# Patient Record
Sex: Female | Born: 1950 | Race: White | Hispanic: No | Marital: Married | Smoking: Never smoker
Health system: Southern US, Community
[De-identification: ages and names within clinical notes are randomized; demographics above are authoritative.]

## PROBLEM LIST (undated history)

## (undated) DIAGNOSIS — B029 Zoster without complications: Secondary | ICD-10-CM

## (undated) DIAGNOSIS — T7840XA Allergy, unspecified, initial encounter: Secondary | ICD-10-CM

## (undated) DIAGNOSIS — J189 Pneumonia, unspecified organism: Secondary | ICD-10-CM

## (undated) DIAGNOSIS — C211 Malignant neoplasm of anal canal: Secondary | ICD-10-CM

## (undated) DIAGNOSIS — D649 Anemia, unspecified: Secondary | ICD-10-CM

## (undated) DIAGNOSIS — J45909 Unspecified asthma, uncomplicated: Secondary | ICD-10-CM

## (undated) DIAGNOSIS — J302 Other seasonal allergic rhinitis: Secondary | ICD-10-CM

## (undated) DIAGNOSIS — M810 Age-related osteoporosis without current pathological fracture: Secondary | ICD-10-CM

## (undated) DIAGNOSIS — E7849 Other hyperlipidemia: Secondary | ICD-10-CM

## (undated) HISTORY — PX: WISDOM TOOTH EXTRACTION: SHX21

## (undated) HISTORY — DX: Other hyperlipidemia: E78.49

## (undated) HISTORY — DX: Zoster without complications: B02.9

## (undated) HISTORY — DX: Allergy, unspecified, initial encounter: T78.40XA

## (undated) HISTORY — DX: Pneumonia, unspecified organism: J18.9

## (undated) HISTORY — DX: Age-related osteoporosis without current pathological fracture: M81.0

---

## 2012-11-25 LAB — HEPATIC FUNCTION PANEL
ALK PHOS: 76 (ref 25–125)
ALT: 17 (ref 7–35)
AST: 20 (ref 13–35)
Bilirubin, Total: 0.4

## 2012-11-25 LAB — BASIC METABOLIC PANEL
BUN: 14 (ref 4–21)
Creatinine: 0.9 (ref <=1.1)
GLUCOSE: 80
POTASSIUM: 4.7 (ref 3.4–5.3)
SODIUM: 141 (ref 137–147)

## 2012-11-25 LAB — TSH: TSH: 1.35 (ref 0.41–5.90)

## 2012-11-25 LAB — CBC AND DIFFERENTIAL
HCT: 52 — AB (ref 36–46)
Hemoglobin: 17.6 — AB (ref 12.0–16.0)
PLATELETS: 316 (ref 150–399)
WBC: 8.6

## 2014-02-04 LAB — HM MAMMOGRAPHY: HM MAMMO: NORMAL (ref 0–4)

## 2017-04-02 HISTORY — PX: POLYPECTOMY: SHX149

## 2017-07-03 ENCOUNTER — Encounter (HOSPITAL_COMMUNITY): Payer: Self-pay | Admitting: Emergency Medicine

## 2017-07-03 ENCOUNTER — Ambulatory Visit (HOSPITAL_COMMUNITY)
Admission: EM | Admit: 2017-07-03 | Discharge: 2017-07-03 | Disposition: A | Discharge status: Home / Self Care | Payer: Medicare Other | Attending: Family Medicine | Admitting: Family Medicine

## 2017-07-03 DIAGNOSIS — J40 Bronchitis, not specified as acute or chronic: Secondary | ICD-10-CM

## 2017-07-03 HISTORY — DX: Unspecified asthma, uncomplicated: J45.909

## 2017-07-03 MED ORDER — BENZONATATE 100 MG PO CAPS: 100.0000 mg | ORAL_CAPSULE | Freq: Three times a day (TID) | ORAL | 0 refills | Status: DC

## 2017-07-03 MED ORDER — AZITHROMYCIN 250 MG PO TABS: 250.0000 mg | ORAL_TABLET | Freq: Every day | ORAL | 0 refills | Status: DC

## 2017-07-03 NOTE — ED Triage Notes (Signed)
PT reports productive cough for over 1 week. PT has history of asthma and pneumonia.

## 2017-07-03 NOTE — ED Provider Notes (Signed)
[facility]    CSN: [id number] Arrival date & time: 07/03/17  1033     History   Chief Complaint Chief Complaint  Patient presents with  . Cough    HPI Rebecca [name] is a 67 y.o. female.   67 year old female with history of asthma comes in for 9 Adami history of URI symptoms.  She has had some productive cough with sore throat.  Denies rhinorrhea, nasal congestion.  Denies fever, chills, night sweats.  Has some shortness of breath and wheezing, restarted her maintenance inhaler Symbicort, with occasional albuterol use with good control of symptoms.  OTC cold medication, Allegra with some relief as well.  Never smoker.     Past Medical History:  Diagnosis Date  . Asthma     There are no active problems to display for this patient.   History reviewed. No pertinent surgical history.  OB History   None      Home Medications    Prior to Admission medications   Medication Sig Start Date End Date Taking? Authorizing Provider  albuterol (PROVENTIL HFA;VENTOLIN HFA) 108 (90 Base) MCG/ACT inhaler Inhale into the lungs every 6 (six) hours as needed for wheezing or shortness of breath.   Yes [provider]  budesonide-formoterol (SYMBICORT) 160-4.5 MCG/ACT inhaler Inhale 2 puffs into the lungs 2 (two) times daily.   Yes [provider]  fexofenadine (ALLEGRA) 60 MG tablet Take 60 mg by mouth 2 (two) times daily.   Yes [provider]  azithromycin (ZITHROMAX) 250 MG tablet Take 1 tablet (250 mg total) by mouth daily. Take first 2 tablets together, then 1 every Fiallos until finished. 07/03/17   Tasia Catchings, Lawerance Matsuo V, PA-C  benzonatate (TESSALON) 100 MG capsule Take 1 capsule (100 mg total) by mouth every 8 (eight) hours. 07/03/17   Ok Edwards, PA-C    Family History No family history on file.  Social History Social History   Tobacco Use  . Smoking status: Not on file  Substance Use Topics  . Alcohol use: Not on file  . Drug use: Not on file      Allergies   Patient has no known allergies.   Review of Systems Review of Systems  Reason unable to perform ROS: See HPI as above.     Physical Exam Triage Vital Signs ED Triage Vitals  Enc Vitals Group     BP 07/03/17 1142 137/73     Pulse Rate 07/03/17 1142 91     Resp 07/03/17 1142 16     Temp 07/03/17 1142 97.7 F (36.5 C)     Temp Source 07/03/17 1142 Oral     SpO2 07/03/17 1142 95 %     Weight 07/03/17 1140 110 lb (49.9 kg)     Height --      Head Circumference --      Peak Flow --      Pain Score 07/03/17 1139 2     Pain Loc --      Pain Edu? --      Excl. in [place]? --    No data found.  Updated Vital Signs BP 137/73   Pulse 91   Temp 97.7 F (36.5 C) (Oral)   Resp 16   Wt 110 lb (49.9 kg)   SpO2 95%   Physical Exam  Constitutional: She is oriented to person, place, and time. She appears well-developed and well-nourished. No distress.  HENT:  Head: Normocephalic and atraumatic.  Right Ear:  Tympanic membrane, external ear and ear canal normal. Tympanic membrane is not erythematous and not bulging.  Left Ear: Tympanic membrane, external ear and ear canal normal. Tympanic membrane is not erythematous and not bulging.  Nose: Nose normal. Right sinus exhibits no maxillary sinus tenderness and no frontal sinus tenderness. Left sinus exhibits no maxillary sinus tenderness and no frontal sinus tenderness.  Mouth/Throat: Uvula is midline, oropharynx is clear and moist and mucous membranes are normal.  Eyes: Pupils are equal, round, and reactive to light. Conjunctivae are normal.  Neck: Normal range of motion. Neck supple.  Cardiovascular: Normal rate, regular rhythm and normal heart sounds. Exam reveals no gallop and no friction rub.  No murmur heard. Pulmonary/Chest: Effort normal and breath sounds normal. She has no decreased breath sounds. She has no wheezes. She has no rhonchi. She has no rales.  Lymphadenopathy:    She has no cervical adenopathy.   Neurological: She is alert and oriented to person, place, and time.  Skin: Skin is warm and dry.  Psychiatric: She has a normal mood and affect. Her behavior is normal. Judgment normal.     UC Treatments / Results  Labs (all labs ordered are listed, but only abnormal results are displayed) Labs Reviewed - No data to display  EKG None Radiology No results found.  Procedures Procedures (including critical care time)  Medications Ordered in UC Medications - No data to display   Initial Impression / Assessment and Plan / UC Course  I have reviewed the triage vital signs and the nursing notes.  Pertinent labs & imaging results that were available during my care of the patient were reviewed by me and considered in my medical decision making (see chart for details).    Will start azithromycin for bronchitis.  Continue Symbicort and albuterol for shortness of breath and wheezing.  Other symptomatic treatment discussed.  Push fluids.  Return precautions.  Otherwise follow-up with PCP as scheduled next week for recheck.  Patient expresses understanding and agrees to plan.  Final Clinical Impressions(s) / UC Diagnoses   Final diagnoses:  Bronchitis    ED Discharge Orders        Ordered    azithromycin (ZITHROMAX) 250 MG tablet  Daily     07/03/17 1214    benzonatate (TESSALON) 100 MG capsule  Every 8 hours     07/03/17 1214        Ok Edwards, PA-C 07/03/17 1219

## 2017-07-03 NOTE — Discharge Instructions (Signed)
Start azithromycin as directed for bronchitis. Tessalon for cough. Continue symbicort and albuterol for shortness of breath/wheezing. Keep hydrated, your urine should be clear to pale yellow in color. Tylenol/motrin for fever and pain. Monitor for any worsening of symptoms, chest pain, shortness of breath, wheezing, swelling of the throat, follow up for reevaluation. Otherwise, follow up with PCP as scheduled for recheck.

## 2017-07-10 ENCOUNTER — Encounter: Payer: Self-pay | Admitting: Nurse Practitioner

## 2017-07-10 ENCOUNTER — Ambulatory Visit (INDEPENDENT_AMBULATORY_CARE_PROVIDER_SITE_OTHER): Payer: Medicare Other | Admitting: Nurse Practitioner

## 2017-07-10 VITALS — BP 124/78 | HR 70 | Temp 98.5°F | Ht 65.0 in | Wt 102.0 lb

## 2017-07-10 DIAGNOSIS — R05 Cough: Secondary | ICD-10-CM

## 2017-07-10 DIAGNOSIS — J302 Other seasonal allergic rhinitis: Secondary | ICD-10-CM

## 2017-07-10 DIAGNOSIS — Z1211 Encounter for screening for malignant neoplasm of colon: Secondary | ICD-10-CM

## 2017-07-10 DIAGNOSIS — E785 Hyperlipidemia, unspecified: Secondary | ICD-10-CM | POA: Diagnosis not present

## 2017-07-10 DIAGNOSIS — J4520 Mild intermittent asthma, uncomplicated: Secondary | ICD-10-CM

## 2017-07-10 DIAGNOSIS — M810 Age-related osteoporosis without current pathological fracture: Secondary | ICD-10-CM

## 2017-07-10 DIAGNOSIS — H6122 Impacted cerumen, left ear: Secondary | ICD-10-CM | POA: Diagnosis not present

## 2017-07-10 DIAGNOSIS — Z1212 Encounter for screening for malignant neoplasm of rectum: Secondary | ICD-10-CM | POA: Diagnosis not present

## 2017-07-10 DIAGNOSIS — J4540 Moderate persistent asthma, uncomplicated: Secondary | ICD-10-CM | POA: Insufficient documentation

## 2017-07-10 DIAGNOSIS — R058 Other specified cough: Secondary | ICD-10-CM

## 2017-07-10 MED ORDER — BUDESONIDE-FORMOTEROL FUMARATE 80-4.5 MCG/ACT IN AERO
2.0000 | INHALATION_SPRAY | Freq: Two times a day (BID) | RESPIRATORY_TRACT | 3 refills | Status: DC
Start: 2017-07-10 — End: 2018-09-25

## 2017-07-10 MED ORDER — FEXOFENADINE HCL 180 MG PO TABS: 180.0000 mg | ORAL_TABLET | Freq: Every day | ORAL | Status: AC

## 2017-07-10 NOTE — Patient Instructions (Addendum)
To follow up in 4-6 weeks for welcome to medicare visit  Fasting blood work prior to appt  To use mucinex DM by mouth twice daily routinely for 7 days then as needed- take with full glass of water Can increase benzonatate to 2 tablets every 8 hours as needed cough in addition to mucinex DM

## 2017-07-10 NOTE — Progress Notes (Signed)
Careteam: Patient Care Team: Lauree Chandler, NP as PCP - General (Geriatric Medicine)  Advanced Directive information    No Known Allergies  Chief Complaint  Patient presents with  . Medical Management of Chronic Issues    Pt is being seen to establish care. Pt recently moved to [place] from [place]. Pt was treated for bronchitis at [facility] on 07/03/17 with Z pack. Completed medication but still has cough.   . ACP    Copy of HCPOA/Living Will/DNR requested     HPI: Patient is a 67 y.o. female seen in the office today to establish care and for routine follow up. Generally only seen yearly and as needed.  Was in [place] for 3 years but did not see her until she was there a while.   Asthma- taking Symbicort 2 puffs twice daily and has done well on this, not needing albuterol often  Seasonal allergies- taking allegra 180 mg daily   Osteoporosis- had been taking fosamax for over 5 years and then it was stopped, last dexa scan was over 4 years ago.   Has not had mammogram in several year  Hx of hyperlipidemia- does not take medication for this.   Moved to [place] 2 weeks ago, cough started before she moved  Was keeping her up at night, productive.  Still has a deep productive cough- sputum has decreased No wheezing or shortness of breath.  Does not feel sick.    Review of Systems:  Review of Systems  Constitutional: Negative for chills, fever and weight loss.  HENT: Negative for tinnitus.   Respiratory: Positive for cough and sputum production. Negative for shortness of breath and wheezing.   Cardiovascular: Negative for chest pain, palpitations and leg swelling.  Gastrointestinal: Negative for abdominal pain, constipation, diarrhea and heartburn.  Genitourinary: Negative for dysuria, frequency and urgency.  Musculoskeletal: Negative for back pain, falls, joint pain and myalgias.  Skin: Negative.   Neurological: Negative for dizziness and headaches.    Endo/Heme/Allergies: Positive for environmental allergies.  Psychiatric/Behavioral: Negative for depression and memory loss. The patient does not have insomnia.    Past Medical History:  Diagnosis Date  . Asthma   . Osteoporosis   . Other hyperlipidemia   . Pneumonia    History reviewed. No pertinent surgical history. Social History:   reports that she has never smoked. She has never used smokeless tobacco. She reports that she drinks about 4.2 oz of alcohol per week. She reports that she does not use drugs.  Family History  Problem Relation Age of Onset  . Cancer - Colon Mother 37  . Osteoporosis Mother   . Other Father 61       accident  . Anxiety disorder Daughter 26  . Diabetes Mellitus II Paternal Grandfather     Medications: Patient's Medications  New Prescriptions   No medications on file  Previous Medications   ALBUTEROL (PROVENTIL HFA;VENTOLIN HFA) 108 (90 BASE) MCG/ACT INHALER    Inhale into the lungs every 6 (six) hours as needed for wheezing or shortness of breath.   BUDESONIDE-FORMOTEROL (SYMBICORT) 160-4.5 MCG/ACT INHALER    Inhale 2 puffs into the lungs 2 (two) times daily.   FEXOFENADINE (ALLEGRA) 60 MG TABLET    Take 60 mg by mouth daily.   Modified Medications   No medications on file  Discontinued Medications   AZITHROMYCIN (ZITHROMAX) 250 MG TABLET    Take 1 tablet (250 mg total) by mouth daily. Take first 2 tablets  together, then 1 every Ger[place] until finished.   BENZONATATE (TESSALON) 100 MG CAPSULE    Take 1 capsule (100 mg total) by mouth every 8 (eight) hours.     Physical Exam:  Vitals:   07/10/17 0859  BP: 124/78  Pulse: 70  Temp: 98.5 F (36.9 C)  TempSrc: Oral  SpO2: 98%  Weight: 102 lb (46.3 kg)  Height: 5\' 5"  (1.651 m)   Body mass index is 16.97 kg/m.  Physical Exam  Constitutional: She is oriented to person, place, and time. She appears well-developed and well-nourished. No distress.  HENT:  Head: Normocephalic and atraumatic.   Mouth/Throat: Oropharynx is clear and moist. No oropharyngeal exudate.  Cerumen impaction on the left  Eyes: Pupils are equal, round, and reactive to light. Conjunctivae are normal.  Neck: Normal range of motion. Neck supple.  Cardiovascular: Normal rate, regular rhythm and normal heart sounds.  Pulmonary/Chest: Effort normal and breath sounds normal.  Abdominal: Soft. Bowel sounds are normal.  Musculoskeletal: She exhibits no edema or tenderness.  Neurological: She is alert and oriented to person, place, and time.  Skin: Skin is warm and dry. She is not diaphoretic.  Psychiatric: She has a normal mood and affect.   Labs reviewed: Basic Metabolic Panel: No results for input(s): NA, K, CL, CO2, GLUCOSE, BUN, CREATININE, CALCIUM, MG, PHOS, TSH in the last 8760 hours. Liver Function Tests: No results for input(s): AST, ALT, ALKPHOS, BILITOT, PROT, ALBUMIN in the last 8760 hours. No results for input(s): LIPASE, AMYLASE in the last 8760 hours. No results for input(s): AMMONIA in the last 8760 hours. CBC: No results for input(s): WBC, NEUTROABS, HGB, HCT, MCV, PLT in the last 8760 hours. Lipid Panel: No results for input(s): CHOL, HDL, LDLCALC, TRIG, CHOLHDL, LDLDIRECT in the last 8760 hours. TSH: No results for input(s): TSH in the last 8760 hours. A1C: No results found for: HGBA1C   Assessment/Plan 1. Mild intermittent asthma, unspecified whether complicated -controlled at this time. - budesonide-formoterol (SYMBICORT) 80-4.5 MCG/ACT inhaler; Inhale 2 puffs into the lungs 2 (two) times daily.  Dispense: 1 Inhaler; Refill: 3 - CBC with Differential/Platelets; Future  2. Hyperlipidemia, unspecified hyperlipidemia type -dietary modifications, will follow up lab - COMPLETE METABOLIC PANEL WITH GFR; Future - Lipid Panel; Future  3. Seasonal allergies - fexofenadine (ALLEGRA) 180 MG tablet; Take 1 tablet (180 mg total) by mouth daily.  4. Age-related osteoporosis without current  pathological fracture -has been on fosamax for several years in the past and been off medication for several years - DG Bone Density; Future  5. Post-viral cough syndrome To use mucinex DM by mouth twice daily routinely for 7 days then as needed- take with full glass of water Can increase benzonatate to 2 tablets every 8 hours as needed cough in addition to mucinex DM  6. Screening for colorectal cancer -mother died from colon cancer at 67, last colonoscopy at 34 - Ambulatory referral to Gastroenterology  7. Impacted cerumen of left ear Some removed via wash however remaining cerumen left in back of ear, pt started to feel dizzy, recommended to use debrox drops, can follow up with [place] for re-evaluation and to flush again   Next appt: 08/12/2017 for welcome to medicare visit.  Carlos American. [place], [facility] [phone]

## 2017-07-12 ENCOUNTER — Other Ambulatory Visit: Payer: Self-pay

## 2017-07-12 DIAGNOSIS — E2839 Other primary ovarian failure: Secondary | ICD-10-CM

## 2017-07-15 ENCOUNTER — Other Ambulatory Visit: Payer: Self-pay | Admitting: Nurse Practitioner

## 2017-07-15 DIAGNOSIS — Z139 Encounter for screening, unspecified: Secondary | ICD-10-CM

## 2017-07-23 ENCOUNTER — Encounter: Payer: Self-pay | Admitting: Internal Medicine

## 2017-07-31 HISTORY — PX: COLONOSCOPY: SHX174

## 2017-08-06 ENCOUNTER — Ambulatory Visit (AMBULATORY_SURGERY_CENTER): Payer: Self-pay

## 2017-08-06 VITALS — Ht 65.0 in | Wt 103.0 lb

## 2017-08-06 DIAGNOSIS — Z800 Family history of malignant neoplasm of digestive organs: Secondary | ICD-10-CM

## 2017-08-06 MED ORDER — PEG 3350-KCL-NA BICARB-NACL 420 G PO SOLR: 4000.0000 mL | Freq: Once | ORAL | 0 refills | Status: AC

## 2017-08-06 NOTE — Progress Notes (Signed)
Per pt, no allergies to soy or egg products.Pt not taking any weight loss meds or using  O2 at home.  Pt refused emmi video.  During PV,  Pt states she had a colon done over 15 years ago in [place], [place]. ,done as a screening. Does not rtemember the doctor

## 2017-08-12 ENCOUNTER — Other Ambulatory Visit: Payer: Medicare Other

## 2017-08-12 DIAGNOSIS — J4520 Mild intermittent asthma, uncomplicated: Secondary | ICD-10-CM | POA: Diagnosis not present

## 2017-08-12 DIAGNOSIS — E785 Hyperlipidemia, unspecified: Secondary | ICD-10-CM | POA: Diagnosis not present

## 2017-08-12 LAB — CBC WITH DIFFERENTIAL/PLATELET
Basophils Absolute: 68 cells/uL (ref 0–200)
Basophils Relative: 1.1 %
EOS PCT: 6 %
Eosinophils Absolute: 372 cells/uL (ref 15–500)
HCT: 46.1 % — ABNORMAL HIGH (ref 35.0–45.0)
HEMOGLOBIN: 15.6 g/dL — AB (ref 11.7–15.5)
Lymphs Abs: 2753 cells/uL (ref 850–3900)
MCH: 30.3 pg (ref 27.0–33.0)
MCHC: 33.8 g/dL (ref 32.0–36.0)
MCV: 89.5 fL (ref 80.0–100.0)
MPV: 10.4 fL (ref 7.5–12.5)
Monocytes Relative: 11 %
Neutro Abs: 2325 cells/uL (ref 1500–7800)
Neutrophils Relative %: 37.5 %
Platelets: 328 10*3/uL (ref 140–400)
RBC: 5.15 10*6/uL — ABNORMAL HIGH (ref 3.80–5.10)
RDW: 11.8 % (ref 11.0–15.0)
Total Lymphocyte: 44.4 %
WBC: 6.2 10*3/uL (ref 3.8–10.8)
WBCMIX: 682 {cells}/uL (ref 200–950)

## 2017-08-12 LAB — COMPLETE METABOLIC PANEL WITH GFR
AG Ratio: 1.9 (calc) (ref 1.0–2.5)
ALKALINE PHOSPHATASE (APISO): 73 U/L (ref 33–130)
ALT: 12 U/L (ref 6–29)
AST: 19 U/L (ref 10–35)
Albumin: 4.6 g/dL (ref 3.6–5.1)
BILIRUBIN TOTAL: 0.7 mg/dL (ref 0.2–1.2)
BUN: 12 mg/dL (ref 7–25)
CHLORIDE: 102 mmol/L (ref 98–110)
CO2: 29 mmol/L (ref 20–32)
CREATININE: 0.72 mg/dL (ref 0.50–0.99)
Calcium: 9.8 mg/dL (ref 8.6–10.4)
GFR, Est African American: 102 mL/min/{1.73_m2} (ref >=60)
GFR, Est Non African American: 88 mL/min/{1.73_m2} (ref >=60)
GLUCOSE: 85 mg/dL (ref 65–99)
Globulin: 2.4 g/dL (calc) (ref 1.9–3.7)
Potassium: 4.8 mmol/L (ref 3.5–5.3)
Sodium: 137 mmol/L (ref 135–146)
Total Protein: 7 g/dL (ref 6.1–8.1)

## 2017-08-12 LAB — LIPID PANEL
CHOL/HDL RATIO: 3.2 (calc) (ref <5.0)
CHOLESTEROL: 282 mg/dL — AB (ref <200)
HDL: 87 mg/dL (ref >50)
LDL Cholesterol (Calc): 174 mg/dL (calc) — ABNORMAL HIGH
NON-HDL CHOLESTEROL (CALC): 195 mg/dL — AB (ref <130)
Triglycerides: 95 mg/dL (ref <150)

## 2017-08-13 ENCOUNTER — Other Ambulatory Visit: Payer: Medicare Other

## 2017-08-14 ENCOUNTER — Encounter: Payer: Medicare Other | Admitting: Nurse Practitioner

## 2017-08-15 ENCOUNTER — Ambulatory Visit
Admission: RE | Admit: 2017-08-15 | Discharge: 2017-08-15 | Disposition: A | Discharge status: Home / Self Care | Payer: Medicare Other | Source: Ambulatory Visit | Attending: Nurse Practitioner | Admitting: Nurse Practitioner

## 2017-08-15 ENCOUNTER — Other Ambulatory Visit: Payer: Self-pay | Admitting: Nurse Practitioner

## 2017-08-15 DIAGNOSIS — Z1231 Encounter for screening mammogram for malignant neoplasm of breast: Secondary | ICD-10-CM | POA: Diagnosis not present

## 2017-08-15 DIAGNOSIS — Z139 Encounter for screening, unspecified: Secondary | ICD-10-CM

## 2017-08-15 DIAGNOSIS — Z780 Asymptomatic menopausal state: Secondary | ICD-10-CM | POA: Diagnosis not present

## 2017-08-15 DIAGNOSIS — E2839 Other primary ovarian failure: Secondary | ICD-10-CM

## 2017-08-15 DIAGNOSIS — M810 Age-related osteoporosis without current pathological fracture: Secondary | ICD-10-CM | POA: Diagnosis not present

## 2017-08-19 ENCOUNTER — Ambulatory Visit (INDEPENDENT_AMBULATORY_CARE_PROVIDER_SITE_OTHER): Payer: Medicare Other | Admitting: Nurse Practitioner

## 2017-08-19 ENCOUNTER — Encounter: Payer: Medicare Other | Admitting: Nurse Practitioner

## 2017-08-19 ENCOUNTER — Encounter: Payer: Self-pay | Admitting: Nurse Practitioner

## 2017-08-19 VITALS — BP 116/60 | HR 79 | Temp 98.2°F | Resp 18 | Ht 65.0 in | Wt 102.2 lb

## 2017-08-19 DIAGNOSIS — R636 Underweight: Secondary | ICD-10-CM | POA: Diagnosis not present

## 2017-08-19 DIAGNOSIS — E785 Hyperlipidemia, unspecified: Secondary | ICD-10-CM

## 2017-08-19 DIAGNOSIS — M810 Age-related osteoporosis without current pathological fracture: Secondary | ICD-10-CM | POA: Diagnosis not present

## 2017-08-19 NOTE — Progress Notes (Signed)
Careteam: Patient Care Team: Lauree Chandler, NP as PCP - General (Geriatric Medicine)  Advanced Directive information    No Known Allergies  Chief Complaint  Patient presents with  . Medical Management of Chronic Issues    Review lab and dexa scan results.   . Medication Refill    No refills needed at this time      HPI: Patient is a 67 y.o. female seen in the office today for routine follow up.  Cancelled physical appt. Plans to have colonscopy and then be out of town.   Hyperlipidemia- aware of her high cholesterol. Reports family hx of high cholesterol but no family history of heart disease. Does not wish to take medication for her high cholesterol. Has made dietary modifications. Exercise includes walking and yoga. Not consistently exercising.  Reports her LDL is elevated so that is helps.   Diagnosised with osteoporoses 15 years ago and another one in [place] 8 years ago.  We do not have her previous records in regards to dexascan. Not taking calcium and vit D. Not consistently doing weight bearing activity  Was on fosamax for a long time but stopped 5 years ago and has not been on medication since.   Has continued to lose weight but attempts to gain weight through healthy choices.   Has a colonoscopy this week.   Does not go to the doctor much, a little overwhelming.   Not at a high risk for Hep C, does not wish for screening for hep C or HIV. Has had screening for HIV in the past   Would like to check with her previous Provider on immunizations, unsure about tdap   No recent asthma flares- stopped using symbicort and has not needed albuterol.   Review of Systems:  Review of Systems  Constitutional: Negative for chills, fever and weight loss.  HENT: Negative for tinnitus.   Respiratory: Negative for cough, sputum production, shortness of breath and wheezing.   Cardiovascular: Negative for chest pain, palpitations and leg swelling.  Gastrointestinal:  Negative for abdominal pain, constipation, diarrhea and heartburn.  Genitourinary: Negative for dysuria, frequency and urgency.  Musculoskeletal: Negative for back pain, falls, joint pain and myalgias.  Skin: Negative.   Neurological: Negative for dizziness and headaches.  Endo/Heme/Allergies: Positive for environmental allergies.  Psychiatric/Behavioral: Negative for depression and memory loss. The patient does not have insomnia.     Past Medical History:  Diagnosis Date  . Allergy    seasonal  . Asthma   . Osteoporosis   . Other hyperlipidemia   . Pneumonia   . Shingles    Past Surgical History:  Procedure Laterality Date  . WISDOM TOOTH EXTRACTION     Social History:   reports that she has never smoked. She has never used smokeless tobacco. She reports that she drinks about 4.2 oz of alcohol per week. She reports that she does not use drugs.  Family History  Problem Relation Age of Onset  . Cancer - Colon Mother 38  . Osteoporosis Mother   . Bipolar disorder Mother   . Mental illness Mother   . Colon cancer Mother   . Other Father 85       accident  . Anxiety disorder Daughter 72  . Diabetes Mellitus II Paternal Grandfather     Medications: Patient's Medications  New Prescriptions   No medications on file  Previous Medications   ALBUTEROL (PROVENTIL HFA;VENTOLIN HFA) 108 (90 BASE) MCG/ACT INHALER    Inhale  into the lungs every 6 (six) hours as needed for wheezing or shortness of breath.   BUDESONIDE-FORMOTEROL (SYMBICORT) 80-4.5 MCG/ACT INHALER    Inhale 2 puffs into the lungs 2 (two) times daily.   FEXOFENADINE (ALLEGRA) 180 MG TABLET    Take 1 tablet (180 mg total) by mouth daily.  Modified Medications   No medications on file  Discontinued Medications   No medications on file     Physical Exam:  Vitals:   08/19/17 1502  BP: 116/60  Pulse: 79  Resp: 18  Temp: 98.2 F (36.8 C)  TempSrc: Oral  SpO2: 96%  Weight: 102 lb 3.2 oz (46.4 kg)  Height: 5'  5" (1.651 m)   Body mass index is 17.01 kg/m.  Physical Exam  Constitutional: She is oriented to person, place, and time. She appears well-developed and well-nourished. No distress.  HENT:  Head: Normocephalic and atraumatic.  Mouth/Throat: Oropharynx is clear and moist. No oropharyngeal exudate.  Eyes: Pupils are equal, round, and reactive to light. Conjunctivae are normal.  Neck: Normal range of motion. Neck supple.  Cardiovascular: Normal rate, regular rhythm and normal heart sounds.  Pulmonary/Chest: Effort normal and breath sounds normal.  Abdominal: Soft. Bowel sounds are normal.  Musculoskeletal: She exhibits no edema or tenderness.  Neurological: She is alert and oriented to person, place, and time.  Skin: Skin is warm and dry. She is not diaphoretic.  Psychiatric: She has a normal mood and affect.   Labs reviewed: Basic Metabolic Panel: Recent Labs    08/12/17 0808  NA 137  K 4.8  CL 102  CO2 29  GLUCOSE 85  BUN 12  CREATININE 0.72  CALCIUM 9.8   Liver Function Tests: Recent Labs    08/12/17 0808  AST 19  ALT 12  BILITOT 0.7  PROT 7.0   No results for input(s): LIPASE, AMYLASE in the last 8760 hours. No results for input(s): AMMONIA in the last 8760 hours. CBC: Recent Labs    08/12/17 0808  WBC 6.2  NEUTROABS 2,325  HGB 15.6*  HCT 46.1*  MCV 89.5  PLT 328   Lipid Panel: Recent Labs    08/12/17 0808  CHOL 282*  HDL 87  LDLCALC 174*  TRIG 95  CHOLHDL 3.2   TSH: No results for input(s): TSH in the last 8760 hours. A1C: No results found for: HGBA1C   Assessment/Plan 1. Age-related osteoporosis without current pathological fracture -dexa results reviewed with pt in detail. T score of -4.3. She has maxed out fosamax course, Recommended to take caltrate with D 600/400 twice a [place] with weight bearing activities, will do prior auth for prolia, in the meantime pt with do research on this before agreeable to start taking. She is aware of risk  for fracture due to osteoporosis   2. Hyperlipidemia, unspecified hyperlipidemia type LDL elevated at 175, however with increase in HDL. Pt reports family hx of elevated cholesterol without significant heart disease or cardiac events therefore she does not wish to be placed on medication. Continues with lifestyle modifications.   3. Underweight Attempts to gain weight with healthy lifestyle and proper dietary choices while limiting cholesterol due to hyperlipidemia.    Next appt: 6 months with Dr Sharee Holster K. [place], [facility] [phone]

## 2017-08-19 NOTE — Patient Instructions (Addendum)
PROLIA injection for osteoporosis    Heart-Healthy Eating Plan Many factors influence your heart health, including eating and exercise habits. Heart (coronary) risk increases with abnormal blood fat (lipid) levels. Heart-healthy meal planning includes limiting unhealthy fats, increasing healthy fats, and making other small dietary changes. This includes maintaining a healthy body weight to help keep lipid levels within a normal range.  What types of fat should I choose?  Choose healthy fats more often. Choose monounsaturated and polyunsaturated fats, such as olive oil and canola oil, flaxseeds, walnuts, almonds, and seeds.  Eat more omega-3 fats. Good choices include salmon, mackerel, sardines, tuna, flaxseed oil, and ground flaxseeds. Aim to eat fish at least two times each week.  Limit saturated fats. Saturated fats are primarily found in animal products, such as meats, butter, and cream. Plant sources of saturated fats include palm oil, palm kernel oil, and coconut oil.  Avoid foods with partially hydrogenated oils in them. These contain trans fats. Examples of foods that contain trans fats are stick margarine, some tub margarines, cookies, crackers, and other baked goods. What general guidelines do I need to follow?  Check food labels carefully to identify foods with trans fats or high amounts of saturated fat.  Fill one half of your plate with vegetables and green salads. Eat 4-5 servings of vegetables per [place]. A serving of vegetables equals 1 cup of raw leafy vegetables,  cup of raw or cooked cut-up vegetables, or  cup of vegetable juice.  Fill one fourth of your plate with whole grains. Look for the word "whole" as the first word in the ingredient list.  Fill one fourth of your plate with lean protein foods.  Eat 4-5 servings of fruit per [place]. A serving of fruit equals one medium whole fruit,  cup of dried fruit,  cup of fresh, frozen, or canned fruit, or  cup of 100% fruit  juice.  Eat more foods that contain soluble fiber. Examples of foods that contain this type of fiber are apples, broccoli, carrots, beans, peas, and barley. Aim to get 20-30 g of fiber per [place].  Eat more home-cooked food and less restaurant, buffet, and fast food.  Limit or avoid alcohol.  Limit foods that are high in starch and sugar.  Avoid fried foods.  Cook foods by using methods other than frying. Baking, boiling, grilling, and broiling are all great options. Other fat-reducing suggestions include: ? Removing the skin from poultry. ? Removing all visible fats from meats. ? Skimming the fat off of stews, soups, and gravies before serving them. ? Steaming vegetables in water or broth.  Lose weight if you are overweight. Losing just 5-10% of your initial body weight can help your overall health and prevent diseases such as diabetes and heart disease.  Increase your consumption of nuts, legumes, and seeds to 4-5 servings per week. One serving of dried beans or legumes equals  cup after being cooked, one serving of nuts equals 1 ounces, and one serving of seeds equals  ounce or 1 tablespoon.  You may need to monitor your salt (sodium) intake, especially if you have high blood pressure. Talk with your health care provider or dietitian to get more information about reducing sodium. What foods can I eat? Grains  Breads, including [place], white, pita, wheat, raisin, rye, oatmeal, and [place]. Tortillas that are neither fried nor made with lard or trans fat. Low-fat rolls, including hotdog and hamburger buns and English muffins. Biscuits. Muffins. Waffles. Pancakes. Light popcorn. Whole-grain cereals.  Flatbread. Melba toast. Pretzels. Breadsticks. Rusks. Low-fat snacks and crackers, including oyster, saltine, matzo, graham, animal, and rye. Rice and pasta, including brown rice and those that are made with whole wheat. Vegetables All vegetables. Fruits All fruits, but limit coconut. Meats  and Other Protein Sources Lean, well-trimmed beef, veal, pork, and lamb. Chicken and [place] without skin. All fish and shellfish. Wild duck, rabbit, pheasant, and venison. Egg whites or low-cholesterol egg substitutes. Dried beans, peas, lentils, and tofu.Seeds and most nuts. Dairy Low-fat or nonfat cheeses, including ricotta, string, and mozzarella. Skim or 1% milk that is liquid, powdered, or evaporated. Buttermilk that is made with low-fat milk. Nonfat or low-fat yogurt. Beverages Mineral water. Diet carbonated beverages. Sweets and Desserts Sherbets and fruit ices. Honey, jam, marmalade, jelly, and syrups. Meringues and gelatins. Pure sugar candy, such as hard candy, jelly beans, gumdrops, mints, marshmallows, and small amounts of dark chocolate. [employer] Eat all sweets and desserts in moderation. Fats and Oils Nonhydrogenated (trans-free) margarines. Vegetable oils, including soybean, sesame, sunflower, olive, peanut, safflower, corn, canola, and cottonseed. Salad dressings or mayonnaise that are made with a vegetable oil. Limit added fats and oils that you use for cooking, baking, salads, and as spreads. Other Cocoa powder. Coffee and tea. All seasonings and condiments. The items listed above may not be a complete list of recommended foods or beverages. Contact your dietitian for more options. What foods are not recommended? Grains Breads that are made with saturated or trans fats, oils, or whole milk. Croissants. Butter rolls. Cheese breads. Sweet rolls. Donuts. Buttered popcorn. Chow mein noodles. High-fat crackers, such as cheese or butter crackers. Meats and Other Protein Sources Fatty meats, such as hotdogs, short ribs, sausage, spareribs, bacon, ribeye roast or steak, and mutton. High-fat deli meats, such as salami and bologna. Caviar. Domestic duck and goose. Organ meats, such as kidney, liver, sweetbreads, brains, gizzard, chitterlings, and heart. Dairy Cream, sour cream,  cream cheese, and creamed cottage cheese. Whole milk cheeses, including blue (bleu), Monte[place] Jack, [place], [place], [place], [place], [place], [place], [place], and [place]. Whole or 2% milk that is liquid, evaporated, or condensed. Whole buttermilk. Cream sauce or high-fat cheese sauce. Yogurt that is made from whole milk. Beverages Regular sodas and drinks with added sugar. Sweets and Desserts Frosting. Pudding. Cookies. Cakes other than angel food cake. Candy that has milk chocolate or white chocolate, hydrogenated fat, butter, coconut, or unknown ingredients. Buttered syrups. Full-fat ice cream or ice cream drinks. Fats and Oils Gravy that has suet, meat fat, or shortening. Cocoa butter, hydrogenated oils, palm oil, coconut oil, palm kernel oil. These can often be found in baked products, candy, fried foods, nondairy creamers, and whipped toppings. Solid fats and shortenings, including bacon fat, salt pork, lard, and butter. Nondairy cream substitutes, such as coffee creamers and sour cream substitutes. Salad dressings that are made of unknown oils, cheese, or sour cream. The items listed above may not be a complete list of foods and beverages to avoid. Contact your dietitian for more information. This information is not intended to replace advice given to you by your health care provider. Make sure you discuss any questions you have with your health care provider. Document Released: 12/27/2007 Document Revised: 10/07/2015 Document Reviewed: 09/10/2013 Elsevier Interactive Patient Education  [employer].

## 2017-08-20 ENCOUNTER — Telehealth: Payer: Self-pay | Admitting: Internal Medicine

## 2017-08-20 NOTE — Telephone Encounter (Signed)
Patient scheduled for a colonoscopy tomorrow with Dr. Hilarie Fredrickson. She called the clinic and states she's had nausea and vomiting and sweating after taking her 4 Dulcolax to begin her prep. I returned patient's call. Patient states she's had 4 episodes of diarrhea, had one episode of dry heaves, but no vomiting. She states that her symptoms have eased up somewhat. She is scheduled to begin her Golytely at 5 pm this evening. I advised patient to try to go ahead with her prep as scheduled and follow the tips she was given in pre-visit to make it more palatable. Advised her to call if her symptoms return or become worse for additional advice. Patient verbalizes understanding.

## 2017-08-21 ENCOUNTER — Telehealth: Payer: Self-pay

## 2017-08-21 ENCOUNTER — Ambulatory Visit (AMBULATORY_SURGERY_CENTER): Payer: Medicare Other | Admitting: Internal Medicine

## 2017-08-21 ENCOUNTER — Other Ambulatory Visit: Payer: Self-pay

## 2017-08-21 ENCOUNTER — Encounter: Payer: Self-pay | Admitting: Internal Medicine

## 2017-08-21 ENCOUNTER — Ambulatory Visit: Payer: Medicare Other | Admitting: Nurse Practitioner

## 2017-08-21 VITALS — BP 109/56 | HR 64 | Resp 13 | Ht 65.0 in | Wt 103.0 lb

## 2017-08-21 DIAGNOSIS — D123 Benign neoplasm of transverse colon: Secondary | ICD-10-CM | POA: Diagnosis not present

## 2017-08-21 DIAGNOSIS — Z800 Family history of malignant neoplasm of digestive organs: Secondary | ICD-10-CM | POA: Diagnosis not present

## 2017-08-21 DIAGNOSIS — D129 Benign neoplasm of anus and anal canal: Secondary | ICD-10-CM

## 2017-08-21 DIAGNOSIS — D128 Benign neoplasm of rectum: Secondary | ICD-10-CM

## 2017-08-21 DIAGNOSIS — K6289 Other specified diseases of anus and rectum: Secondary | ICD-10-CM | POA: Diagnosis not present

## 2017-08-21 DIAGNOSIS — Z1211 Encounter for screening for malignant neoplasm of colon: Secondary | ICD-10-CM

## 2017-08-21 DIAGNOSIS — D122 Benign neoplasm of ascending colon: Secondary | ICD-10-CM | POA: Diagnosis not present

## 2017-08-21 MED ORDER — SODIUM CHLORIDE 0.9 % IV SOLN: 500.0000 mL | Freq: Once | INTRAVENOUS | Status: DC

## 2017-08-21 NOTE — Op Note (Signed)
[place] Patient Name: Rebecca [name] Procedure Date: 08/21/2017 8:40 AM MRN: [id number] Endoscopist: Jerene Bears , MD Age: 67 Referring MD:  Date of Birth: 1950/08/01 Gender: Female Account #: [id number] Procedure:                Colonoscopy Indications:              Screening in patient at increased risk: Family                            history of 1st-degree relative with colorectal                            cancer Medicines:                Monitored Anesthesia Care Procedure:                Pre-Anesthesia Assessment:                           - Prior to the procedure, a History and Physical                            was performed, and patient medications and                            allergies were reviewed. The patient's tolerance of                            previous anesthesia was also reviewed. The risks                            and benefits of the procedure and the sedation                            options and risks were discussed with the patient.                            All questions were answered, and informed consent                            was obtained. Prior Anticoagulants: The patient has                            taken no previous anticoagulant or antiplatelet                            agents. ASA Grade Assessment: II - A patient with                            mild systemic disease. After reviewing the risks                            and benefits, the patient was deemed in  satisfactory condition to undergo the procedure.                           - Prior to the procedure, a History and Physical                            was performed, and patient medications and                            allergies were reviewed. The patient's tolerance of                            previous anesthesia was also reviewed. The risks                            and benefits of the procedure and the sedation                             options and risks were discussed with the patient.                            All questions were answered, and informed consent                            was obtained. Prior Anticoagulants: The patient has                            taken no previous anticoagulant or antiplatelet                            agents. ASA Grade Assessment: II - A patient with                            mild systemic disease. After reviewing the risks                            and benefits, the patient was deemed in                            satisfactory condition to undergo the procedure.                           After obtaining informed consent, the colonoscope                            was passed under direct vision. Throughout the                            procedure, the patient's blood pressure, pulse, and                            oxygen saturations were monitored continuously. The  Colonoscope was introduced through the anus and                            advanced to the cecum, identified by appendiceal                            orifice and ileocecal valve. The colonoscopy was                            performed without difficulty. The patient tolerated                            the procedure well. The quality of the bowel                            preparation was good. The ileocecal valve,                            appendiceal orifice, and rectum were photographed. Scope In: 8:47:56 AM Scope Out: 9:08:18 AM Scope Withdrawal Time: 0 hours 15 minutes 47 seconds  Total Procedure Duration: 0 hours 20 minutes 22 seconds  Findings:                 The digital rectal exam was normal.                           Two sessile polyps were found in the hepatic                            flexure and ascending colon. The polyps were 5 to 7                            mm in size. These polyps were removed with a cold                            snare. Resection and retrieval were  complete.                           Multiple small-mouthed diverticula were found in                            the sigmoid colon and descending colon.                           An area of polypoid mucosa was found in the distal                            rectum near the dentate line. The mucosa was                            slightly umbilicated at this area. This was                            biopsied  with a cold forceps for histology. Complications:            No immediate complications. Estimated Blood Loss:     Estimated blood loss was minimal. Impression:               - Two 5 to 7 mm polyps at the hepatic flexure and                            in the ascending colon, removed with a cold snare.                            Resected and retrieved.                           - Mild diverticulosis in the sigmoid colon and in                            the descending colon.                           - Polypoid mucosa in the distal rectum at anorectal                            verge. Biopsied to exclude dysplasia. Recommendation:           - Patient has a contact number available for                            emergencies. The signs and symptoms of potential                            delayed complications were discussed with the                            patient. Return to normal activities tomorrow.                            Written discharge instructions were provided to the                            patient.                           - Resume previous diet.                           - Continue present medications.                           - Await pathology results.                           - Repeat colonoscopy date to be determined after                            pending pathology results are reviewed for  surveillance. Jerene Bears, MD 08/21/2017 9:14:48 AM This report has been signed electronically.

## 2017-08-21 NOTE — Progress Notes (Signed)
A and O x3. Report to RN. Tolerated MAC anesthesia well.

## 2017-08-21 NOTE — Progress Notes (Signed)
Called to room to assist during endoscopic procedure.  Patient ID and intended procedure confirmed with present staff. Received instructions for my participation in the procedure from the performing physician.  

## 2017-08-21 NOTE — Progress Notes (Signed)
Pt's states no medical or surgical changes since previsit or office visit.Pt's states no medical or surgical changes since previsit or office visit. 

## 2017-08-21 NOTE — Telephone Encounter (Signed)
I called patient to speak with her regarding the cost of prolia, which is $243.00 out of pocket. Patient stated that she is still undecided as to whether or not she is interested in actually taking the prolia. She stated that she will be out of town for the next month but she will let the office know her decision once she returns.

## 2017-08-21 NOTE — Patient Instructions (Signed)
**   Handouts given on polyps and diverticulosis **   YOU HAD AN ENDOSCOPIC PROCEDURE TODAY AT THE [facility]:   Refer to the procedure report that was given to you for any specific questions about what was found during the examination.  If the procedure report does not answer your questions, please call your gastroenterologist to clarify.  If you requested that your care partner not be given the details of your procedure findings, then the procedure report has been included in a sealed envelope for you to review at your convenience later.  YOU SHOULD EXPECT: Some feelings of bloating in the abdomen. Passage of more gas than usual.  Walking can help get rid of the air that was put into your GI tract during the procedure and reduce the bloating. If you had a lower endoscopy (such as a colonoscopy or flexible sigmoidoscopy) you may notice spotting of blood in your stool or on the toilet paper. If you underwent a bowel prep for your procedure, you may not have a normal bowel movement for a few days.  Please Note:  You might notice some irritation and congestion in your nose or some drainage.  This is from the oxygen used during your procedure.  There is no need for concern and it should clear up in a [place] or so.  SYMPTOMS TO REPORT IMMEDIATELY:   Following lower endoscopy (colonoscopy or flexible sigmoidoscopy):  Excessive amounts of blood in the stool  Significant tenderness or worsening of abdominal pains  Swelling of the abdomen that is new, acute  Fever of 100F or higher  For urgent or emergent issues, a gastroenterologist can be reached at any hour by calling [phone].   DIET:  We do recommend a small meal at first, but then you may proceed to your regular diet.  Drink plenty of fluids but you should avoid alcoholic beverages for 24 hours.  ACTIVITY:  You should plan to take it easy for the rest of today and you should NOT DRIVE or use heavy machinery until tomorrow (because  of the sedation medicines used during the test).    FOLLOW UP: Our staff will call the number listed on your records the next business [place] following your procedure to check on you and address any questions or concerns that you may have regarding the information given to you following your procedure. If we do not reach you, we will leave a message.  However, if you are feeling well and you are not experiencing any problems, there is no need to return our call.  We will assume that you have returned to your regular daily activities without incident.  If any biopsies were taken you will be contacted by phone or by letter within the next 1-3 weeks.  Please call us at [phone] if you have not heard about the biopsies in 3 weeks.    SIGNATURES/CONFIDENTIALITY: You and/or your care partner have signed paperwork which will be entered into your electronic medical record.  These signatures attest to the fact that that the information above on your After Visit Summary has been reviewed and is understood.  Full responsibility of the confidentiality of this discharge information lies with you and/or your care-partner. 

## 2017-08-22 ENCOUNTER — Telehealth: Payer: Self-pay | Admitting: *Deleted

## 2017-08-22 ENCOUNTER — Ambulatory Visit: Payer: Medicare Other | Admitting: Nurse Practitioner

## 2017-08-22 NOTE — Telephone Encounter (Signed)
  Follow up Call-  Call back number 08/21/2017  Post procedure Call Back phone  # [phone]  Permission to leave phone message Yes     Patient questions:  Do you have a fever, pain , or abdominal swelling? No. Pain Score  0 *  Have you tolerated food without any problems? Yes.    Have you been able to return to your normal activities? Yes.    Do you have any questions about your discharge instructions: Diet   No. Medications  No. Follow up visit  No.  Do you have questions or concerns about your Care? No.  Actions: * If pain score is 4 or above: No action needed, pain <4.

## 2017-08-28 ENCOUNTER — Encounter: Payer: Self-pay | Admitting: Internal Medicine

## 2018-02-13 DIAGNOSIS — Z23 Encounter for immunization: Secondary | ICD-10-CM | POA: Diagnosis not present

## 2018-02-24 ENCOUNTER — Ambulatory Visit: Payer: Medicare Other | Admitting: Internal Medicine

## 2018-09-25 ENCOUNTER — Ambulatory Visit (INDEPENDENT_AMBULATORY_CARE_PROVIDER_SITE_OTHER): Payer: Medicare Other | Admitting: Family

## 2018-09-25 ENCOUNTER — Encounter: Payer: Self-pay | Admitting: Family

## 2018-09-25 ENCOUNTER — Other Ambulatory Visit: Payer: Self-pay

## 2018-09-25 VITALS — BP 110/60 | HR 93 | Temp 98.6°F | Ht 65.0 in | Wt 96.0 lb

## 2018-09-25 DIAGNOSIS — Z23 Encounter for immunization: Secondary | ICD-10-CM

## 2018-09-25 DIAGNOSIS — E785 Hyperlipidemia, unspecified: Secondary | ICD-10-CM

## 2018-09-25 DIAGNOSIS — R636 Underweight: Secondary | ICD-10-CM

## 2018-09-25 DIAGNOSIS — Z681 Body mass index (BMI) 19 or less, adult: Secondary | ICD-10-CM

## 2018-09-25 DIAGNOSIS — Z0000 Encounter for general adult medical examination without abnormal findings: Secondary | ICD-10-CM | POA: Diagnosis not present

## 2018-09-25 DIAGNOSIS — H6122 Impacted cerumen, left ear: Secondary | ICD-10-CM

## 2018-09-25 DIAGNOSIS — J4520 Mild intermittent asthma, uncomplicated: Secondary | ICD-10-CM | POA: Diagnosis not present

## 2018-09-25 DIAGNOSIS — M810 Age-related osteoporosis without current pathological fracture: Secondary | ICD-10-CM | POA: Diagnosis not present

## 2018-09-25 MED ORDER — TETANUS-DIPHTH-ACELL PERTUSSIS 5-2-15.5 LF-MCG/0.5 IM SUSP: 0.5000 mL | Freq: Once | INTRAMUSCULAR | 0 refills | Status: AC

## 2018-09-25 MED ORDER — PNEUMOCOCCAL VAC POLYVALENT 25 MCG/0.5ML IJ INJ: 0.5000 mL | INJECTION | Freq: Once | INTRAMUSCULAR | 0 refills | Status: AC

## 2018-09-25 NOTE — Patient Instructions (Signed)
Calorie Counting for Weight Loss Calories are units of energy. Your body needs a certain amount of calories from food to keep you going throughout the [place]. When you eat more calories than your body needs, your body stores the extra calories as fat. When you eat fewer calories than your body needs, your body burns fat to get the energy it needs. Calorie counting means keeping track of how many calories you eat and drink each [place]. Calorie counting can be helpful if you need to lose weight. If you make sure to eat fewer calories than your body needs, you should lose weight. Ask your health care provider what a healthy weight is for you. For calorie counting to work, you will need to eat the right number of calories in a Saad in order to lose a healthy amount of weight per week. A dietitian can help you determine how many calories you need in a [place] and will give you suggestions on how to reach your calorie goal.  A healthy amount of weight to lose per week is usually 1-2 lb (0.5-0.9 kg). This usually means that your daily calorie intake should be reduced by 500-750 calories.  Eating 1,200 - 1,500 calories per Rahmani can help most women lose weight.  Eating 1,500 - 1,800 calories per Shahin can help most men lose weight. What is my plan? My goal is to have __________ calories per Lemieux. If I have this many calories per Reeser, I should lose around __________ pounds per week. What do I need to know about calorie counting? In order to meet your daily calorie goal, you will need to:  Find out how many calories are in each food you would like to eat. Try to do this before you eat.  Decide how much of the food you plan to eat.  Write down what you ate and how many calories it had. Doing this is called keeping a food log. To successfully lose weight, it is important to balance calorie counting with a healthy lifestyle that includes regular activity. Aim for 150 minutes of moderate exercise (such as walking) or 75  minutes of vigorous exercise (such as running) each week. Where do I find calorie information?  The number of calories in a food can be found on a Nutrition Facts label. If a food does not have a Nutrition Facts label, try to look up the calories online or ask your dietitian for help. Remember that calories are listed per serving. If you choose to have more than one serving of a food, you will have to multiply the calories per serving by the amount of servings you plan to eat. For example, the label on a package of bread might say that a serving size is 1 slice and that there are 90 calories in a serving. If you eat 1 slice, you will have eaten 90 calories. If you eat 2 slices, you will have eaten 180 calories. How do I keep a food log? Immediately after each meal, record the following information in your food log:  What you ate. Don't forget to include toppings, sauces, and other extras on the food.  How much you ate. This can be measured in cups, ounces, or number of items.  How many calories each food and drink had.  The total number of calories in the meal. Keep your food log near you, such as in a small notebook in your pocket, or use a mobile app or website. Some programs will calculate   calories for you and show you how many calories you have left for the [place] to meet your goal. What are some calorie counting tips?   Use your calories on foods and drinks that will fill you up and not leave you hungry: ? Some examples of foods that fill you up are nuts and nut butters, vegetables, lean proteins, and high-fiber foods like whole grains. High-fiber foods are foods with more than 5 g fiber per serving. ? Drinks such as sodas, specialty coffee drinks, alcohol, and juices have a lot of calories, yet do not fill you up.  Eat nutritious foods and avoid empty calories. Empty calories are calories you get from foods or beverages that do not have many vitamins or protein, such as candy, sweets, and  soda. It is better to have a nutritious high-calorie food (such as an avocado) than a food with few nutrients (such as a bag of chips).  Know how many calories are in the foods you eat most often. This will help you calculate calorie counts faster.  Pay attention to calories in drinks. Low-calorie drinks include water and unsweetened drinks.  Pay attention to nutrition labels for "low fat" or "fat free" foods. These foods sometimes have the same amount of calories or more calories than the full fat versions. They also often have added sugar, starch, or salt, to make up for flavor that was removed with the fat.  Find a way of tracking calories that works for you. Get creative. Try different apps or programs if writing down calories does not work for you. What are some portion control tips?  Know how many calories are in a serving. This will help you know how many servings of a certain food you can have.  Use a measuring cup to measure serving sizes. You could also try weighing out portions on a kitchen scale. With time, you will be able to estimate serving sizes for some foods.  Take some time to put servings of different foods on your favorite plates, bowls, and cups so you know what a serving looks like.  Try not to eat straight from a bag or box. Doing this can lead to overeating. Put the amount you would like to eat in a cup or on a plate to make sure you are eating the right portion.  Use smaller plates, glasses, and bowls to prevent overeating.  Try not to multitask (for example, watch TV or use your computer) while eating. If it is time to eat, sit down at a table and enjoy your food. This will help you to know when you are full. It will also help you to be aware of what you are eating and how much you are eating. What are tips for following this plan? Reading food labels  Check the calorie count compared to the serving size. The serving size may be smaller than what you are used to  eating.  Check the source of the calories. Make sure the food you are eating is high in vitamins and protein and low in saturated and trans fats. Shopping  Read nutrition labels while you shop. This will help you make healthy decisions before you decide to purchase your food.  Make a grocery list and stick to it. Cooking  Try to cook your favorite foods in a healthier way. For example, try baking instead of frying.  Use low-fat dairy products. Meal planning  Use more fruits and vegetables. Half of your plate should be fruits   and vegetables.  Include lean proteins like poultry and fish. How do I count calories when eating out?  Ask for smaller portion sizes.  Consider sharing an entree and sides instead of getting your own entree.  If you get your own entree, eat only half. Ask for a box at the beginning of your meal and put the rest of your entree in it so you are not tempted to eat it.  If calories are listed on the menu, choose the lower calorie options.  Choose dishes that include vegetables, fruits, whole grains, low-fat dairy products, and lean protein.  Choose items that are boiled, broiled, grilled, or steamed. Stay away from items that are buttered, battered, fried, or served with cream sauce. Items labeled "crispy" are usually fried, unless stated otherwise.  Choose water, low-fat milk, unsweetened iced tea, or other drinks without added sugar. If you want an alcoholic beverage, choose a lower calorie option such as a glass of wine or light beer.  Ask for dressings, sauces, and syrups on the side. These are usually high in calories, so you should limit the amount you eat.  If you want a salad, choose a garden salad and ask for grilled meats. Avoid extra toppings like bacon, cheese, or fried items. Ask for the dressing on the side, or ask for olive oil and vinegar or lemon to use as dressing.  Estimate how many servings of a food you are given. For example, a serving of  cooked rice is  cup or about the size of half a baseball. Knowing serving sizes will help you be aware of how much food you are eating at restaurants. The list below tells you how big or small some common portion sizes are based on everyday objects: ? 1 oz-4 stacked dice. ? 3 oz-1 deck of cards. ? 1 tsp-1 die. ? 1 Tbsp- a ping-pong ball. ? 2 Tbsp-1 ping-pong ball. ?  cup- baseball. ? 1 cup-1 baseball. Summary  Calorie counting means keeping track of how many calories you eat and drink each [place]. If you eat fewer calories than your body needs, you should lose weight.  A healthy amount of weight to lose per week is usually 1-2 lb (0.5-0.9 kg). This usually means reducing your daily calorie intake by 500-750 calories.  The number of calories in a food can be found on a Nutrition Facts label. If a food does not have a Nutrition Facts label, try to look up the calories online or ask your dietitian for help.  Use your calories on foods and drinks that will fill you up, and not on foods and drinks that will leave you hungry.  Use smaller plates, glasses, and bowls to prevent overeating. This information is not intended to replace advice given to you by your health care provider. Make sure you discuss any questions you have with your health care provider. Document Released: 03/19/2005 Document Revised: 12/06/2017 Document Reviewed: 02/17/2016 Elsevier Interactive Patient Education  2019 Elsevier Inc.  

## 2018-09-25 NOTE — Progress Notes (Signed)
Subjective:    Rebecca [name] is a 68 y.o. female who presents for a Welcome to Medicare exam.   Review of Systems  Cardiac Risk Factors include: advanced age (>58men, >2 women);dyslipidemia      Objective:    Today's Vitals   09/25/18 1020  BP: 110/60  Pulse: 93  Temp: 98.6 F (37 C)  TempSrc: Oral  SpO2: 96%  Weight: 96 lb (43.5 kg)  Height: 5\' 5"  (1.651 m)  Body mass index is 15.98 kg/m.  Medications Outpatient Encounter Medications as of 09/25/2018  Medication Sig  . fexofenadine (ALLEGRA) 180 MG tablet Take 1 tablet (180 mg total) by mouth daily.  . [DISCONTINUED] albuterol (PROVENTIL HFA;VENTOLIN HFA) 108 (90 Base) MCG/ACT inhaler Inhale into the lungs every 6 (six) hours as needed for wheezing or shortness of breath.  . [DISCONTINUED] budesonide-formoterol (SYMBICORT) 80-4.5 MCG/ACT inhaler Inhale 2 puffs into the lungs 2 (two) times daily.  . [DISCONTINUED] 0.9 %  sodium chloride infusion    No facility-administered encounter medications on file as of 09/25/2018.      History: Past Medical History:  Diagnosis Date  . Allergy    seasonal  . Asthma   . Osteoporosis   . Other hyperlipidemia   . Pneumonia   . Shingles    Past Surgical History:  Procedure Laterality Date  . WISDOM TOOTH EXTRACTION      Family History  Problem Relation Age of Onset  . Cancer - Colon Mother 3  . Osteoporosis Mother   . Bipolar disorder Mother   . Mental illness Mother   . Colon cancer Mother   . Other Father 30       airplane accident  . Anxiety disorder Daughter 60  . Diabetes Mellitus II Paternal Grandfather   . Esophageal cancer Neg Hx   . Stomach cancer Neg Hx   . Rectal cancer Neg Hx    Social History   Occupational History  . Not on file  Tobacco Use  . Smoking status: Never Smoker  . Smokeless tobacco: Never Used  Substance and Sexual Activity  . Alcohol use: Yes    Alcohol/week: 7.0 standard drinks    Types: 7 Glasses of wine per week  . Drug use:  Never  . Sexual activity: Not Currently    Tobacco Counseling Counseling given: Not Answered   Immunizations and Health Maintenance Immunization History  Administered Date(s) Administered  . Influenza, High Dose Seasonal PF 02/13/2018  . Influenza-Unspecified 12/31/2016  . Pneumococcal Conjugate-13 01/17/2017   Health Maintenance Due  Topic Date Due  . Hepatitis C Screening  07-31-1950  . TETANUS/TDAP  07/20/1971  . PNA vac Low Risk Adult (2 of 2 - PPSV23) 01/17/2018    Activities of Daily Living In your present state of health, do you have any difficulty performing the following activities: 09/25/2018  Hearing? N  Vision? N  Difficulty concentrating or making decisions? N  Walking or climbing stairs? N  Dressing or bathing? N  Doing errands, shopping? N  Preparing Food and eating ? N  Using the Toilet? N  In the past six months, have you accidently leaked urine? N  Do you have problems with loss of bowel control? N  Managing your Medications? N  Managing your Finances? N  Housekeeping or managing your Housekeeping? N  Some recent data might be hidden    Physical Exam No other factors deemed appropriate based on the beneficiary's medical and social history and current clinical standards.  Advanced  Directives: Does Patient Have a Medical Advance Directive?: Yes Type of Advance Directive: Healthcare Power of Attorney, Living will Does patient want to make changes to medical advance directive?: No - Patient declined Copy of [place] in Chart?: No - copy requested    Assessment:    This is a routine wellness examination for this patient.Continue to exercise and eat heart healthy diet to keep cholesterol level.Increase plant base protein to promote healthy weight gain.   Vision/Hearing screen  Hearing Screening   125Hz  250Hz  500Hz  1000Hz  2000Hz  3000Hz  4000Hz  6000Hz  8000Hz   Right ear:           Left ear:           Comments: Passed hearing test   Vision Screening Comments: Last eye exam 2018  Dietary issues and exercise activities discussed:  Current Exercise Habits: Structured exercise class, Type of exercise: yoga;walking, Time (Minutes): 60, Frequency (Times/Week): 2, Weekly Exercise (Minutes/Week): 120, Intensity: Moderate, Exercise limited by: None identified  Goals    . Gain weight     1. I want to gain some weight 110 lbs  2. Eat healthy diet to keep cholesterol        Depression Screen PHQ 2/9 Scores 09/25/2018 09/25/2018 07/10/2017  PHQ - 2 Score 0 0 0     Fall Risk Fall Risk  09/25/2018  Falls in the past year? 0  Number falls in past yr: 0  Injury with Fall? 0    Cognitive Function: MMSE - Mini Mental State Exam 09/25/2018  Orientation to time 4  Orientation to Place 5  Registration 3  Attention/ Calculation 5  Recall 3  Language- name 2 objects 2  Language- repeat 1  Language- follow 3 step command 3  Language- read & follow direction 1  Write a sentence 1  Copy design 1  Total score 29   Patient Care Team: Lauree Chandler, NP as PCP - General (Geriatric Medicine)     Plan:  - Tdap vaccine order send to pharmacy  - Pneumonia Vac vaccine order send to pharmacy   I have personally reviewed and noted the following in the patient's chart:   . Medical and social history . Use of alcohol, tobacco or illicit drugs  . Current medications and supplements . Functional ability and status . Nutritional status . Physical activity . Advanced directives . List of other physicians . Hospitalizations, surgeries, and ER visits in previous 12 months . Vitals . Screenings to include cognitive, depression, and falls . Referrals and appointments  In addition, I have reviewed and discussed with patient certain preventive protocols, quality metrics, and best practice recommendations. A written personalized care plan for preventive services as well as general preventive health recommendations were provided to  patient.   Sandrea Hughs, NP 09/25/2018

## 2018-09-25 NOTE — Progress Notes (Signed)
Provider: Alida Greiner FNP-C   Lauree Chandler, NP  Patient Care Team: Lauree Chandler, NP as PCP - General (Geriatric Medicine)  Extended Emergency Contact Information Primary Emergency Contact: [name] Address: [address] [place]          [place], [place] [address] [name] of [place] Phone: [phone] Mobile Phone: [phone] Relation: Spouse Secondary Emergency Contact: Yielding,[name] Home Phone: [phone] Relation: None  Code Status: Full Code  Goals of care: Advanced Directive information Advanced Directives 09/25/2018  Does Patient Have a Medical Advance Directive? Yes  Type of Paramedic of [place];Living will  Does patient want to make changes to medical advance directive? No - Patient declined  Copy of [place] in Chart? No - copy requested     Chief Complaint  Patient presents with  . Medical Management of Chronic Issues    follow-up, discuss weight loss    HPI:  Pt is a 68 y.o. female seen today for medical management of chronic diseases.she is concerned about her weight loss.Her previous weight was 103 lbs (08/22/2018) has had 7 lbs weight loss since then.she states usually goes to the [facility] exercises by walking daily and does Yoga.she eats three meals daily and snacks in between.she continues to exercise to try and keep her cholesterol level down.she states all her family members are lean in body.she has had no issues with nausea,vomiting,diarrhea,signs of hypo/hyperthyroid.  Asthma - Has not required uses of the inhalers for the past one year.No wheezing,cough or shortness of breath.  Osteoporosis - she was previously on calcium/vit D supplement but states stopped taking the calcium due to constipation.completed course of Fosamax  in the past.last Dexa scan T-score -4.3  (08/15/2017).no recent falls or fractures.Does weight bearing exercises on a regular basis.    Allergies - states symptoms  were well controlled this spring.on Allegra 180 mg tablet daily.  Hyperlipidemia - latest chol 282,HDL 87,TRG 95 and LDL 174.she eats healthy ands exercises by walking.  Past Medical History:  Diagnosis Date  . Allergy    seasonal  . Asthma   . Osteoporosis   . Other hyperlipidemia   . Pneumonia   . Shingles    Past Surgical History:  Procedure Laterality Date  . WISDOM TOOTH EXTRACTION      No Known Allergies  Allergies as of 09/25/2018   No Known Allergies     Medication List       Accurate as of September 25, 2018 11:06 AM. If you have any questions, ask your nurse or doctor.        STOP taking these medications   albuterol 108 (90 Base) MCG/ACT inhaler Commonly known as: VENTOLIN HFA Stopped by: Sandrea Hughs, NP   budesonide-formoterol 80-4.5 MCG/ACT inhaler Commonly known as: SYMBICORT Stopped by: Sandrea Hughs, NP     TAKE these medications   fexofenadine 180 MG tablet Commonly known as: ALLEGRA Take 1 tablet (180 mg total) by mouth daily.       Review of Systems  Constitutional: Negative for appetite change, chills, fatigue and fever.  HENT: Negative for congestion, postnasal drip, rhinorrhea, sinus pressure, sinus pain, sneezing, sore throat and trouble swallowing.   Eyes: Positive for visual disturbance. Negative for discharge, redness and itching.       Wears eye glasses   Respiratory: Negative for cough, chest tightness, shortness of breath and wheezing.   Cardiovascular: Negative for chest pain, palpitations and leg swelling.  Gastrointestinal:  Negative for abdominal pain, constipation, diarrhea, nausea and vomiting.  Endocrine: Negative for cold intolerance, heat intolerance, polydipsia, polyphagia and polyuria.  Genitourinary: Negative for difficulty urinating, dysuria, flank pain, frequency and urgency.  Musculoskeletal: Negative for arthralgias and gait problem.  Skin: Negative for color change, pallor and rash.  Neurological: Negative for  dizziness, speech difficulty, weakness, light-headedness and headaches.  Hematological: Does not bruise/bleed easily.  Psychiatric/Behavioral: Negative for agitation, confusion and sleep disturbance. The patient is not nervous/anxious.     Immunization History  Administered Date(s) Administered  . Influenza, High Dose Seasonal PF 02/13/2018  . Influenza-Unspecified 12/31/2016  . Pneumococcal Conjugate-13 01/17/2017   Pertinent  Health Maintenance Due  Topic Date Due  . PNA vac Low Risk Adult (2 of 2 - PPSV23) 01/17/2018  . INFLUENZA VACCINE  11/01/2018  . MAMMOGRAM  08/16/2019  . COLONOSCOPY  08/22/2022  . DEXA SCAN  Completed   Fall Risk  09/25/2018 09/25/2018 07/10/2017  Falls in the past year? 0 0 No  Number falls in past yr: 0 0 -  Injury with Fall? 0 0 -    Vitals:   09/25/18 1034  BP: 110/60  Pulse: 93  Temp: 98.6 F (37 C)  TempSrc: Oral  SpO2: 96%  Weight: 96 lb (43.5 kg)  Height: 5' 5"  (1.651 m)   Body mass index is 15.98 kg/m. Physical Exam Vitals signs reviewed.  Constitutional:      General: She is not in acute distress.    Appearance: She is underweight. She is not ill-appearing.  HENT:     Head: Normocephalic.     Right Ear: Tympanic membrane, ear canal and external ear normal. There is no impacted cerumen.     Left Ear: There is impacted cerumen.     Nose: No congestion or rhinorrhea.     Mouth/Throat:     Mouth: Mucous membranes are moist.     Pharynx: Oropharynx is clear. No oropharyngeal exudate or posterior oropharyngeal erythema.  Eyes:     General: No scleral icterus.       Right eye: No discharge.        Left eye: No discharge.     Conjunctiva/sclera: Conjunctivae normal.     Pupils: Pupils are equal, round, and reactive to light.  Neck:     Musculoskeletal: Normal range of motion. No neck rigidity or muscular tenderness.     Vascular: No carotid bruit.  Cardiovascular:     Rate and Rhythm: Normal rate and regular rhythm.     Pulses:  Normal pulses.     Heart sounds: Normal heart sounds. No murmur. No friction rub. No gallop.   Pulmonary:     Effort: Pulmonary effort is normal. No respiratory distress.     Breath sounds: Normal breath sounds. No wheezing, rhonchi or rales.  Chest:     Chest wall: No tenderness.  Abdominal:     General: Abdomen is flat. Bowel sounds are normal. There is no distension.     Palpations: Abdomen is soft. There is no mass.     Tenderness: There is no abdominal tenderness. There is no right CVA tenderness, left CVA tenderness, guarding or rebound.  Musculoskeletal: Normal range of motion.        General: No swelling or tenderness.     Right lower leg: No edema.     Left lower leg: No edema.  Lymphadenopathy:     Cervical: No cervical adenopathy.  Skin:    General: Skin is warm and  dry.     Coloration: Skin is not pale.     Findings: No bruising, erythema or rash.  Neurological:     Mental Status: She is alert and oriented to person, place, and time.     Cranial Nerves: No cranial nerve deficit.     Sensory: No sensory deficit.     Motor: No weakness.     Coordination: Coordination normal.     Gait: Gait normal.  Psychiatric:        Mood and Affect: Mood normal.        Behavior: Behavior normal.        Thought Content: Thought content normal.        Judgment: Judgment normal.    Labs reviewed:  Lab Results  Component Value Date   TSH 1.35 11/20/2012   No results found for: HGBA1C Lab Results  Component Value Date   CHOL 282 (H) 08/12/2017   HDL 87 08/12/2017   LDLCALC 174 (H) 08/12/2017   TRIG 95 08/12/2017   CHOLHDL 3.2 08/12/2017    Significant Diagnostic Results in last 30 days:  No results found.  Assessment/Plan 1. Hyperlipidemia, unspecified hyperlipidemia type Heart healthy diet and  physical activity/exercise as tolerated. - Lipid panel; Future  2. Underweight Encouraged to increase protein shakes/supplement.Walks 8 miles daily discussed reducing the  miles. - CBC with Differential/Platelet; Future - CMP with eGFR([facility]) - TSH; Future  3. Age-related osteoporosis without current pathological fracture No fracture.Dexa scan reviewed done 08/15/2017 T-score of left Femur -4.3 treated with Fosmax. Declines any calcium-vit D due to constipation.   4. Mild intermittent asthma, unspecified whether complicated Symptoms under control.Has not required inhaler in more than a year.   5. Body mass index (BMI) of 19 or less in adult BMI 15.88 walks 8 miles daily.Has good appetite.suspect could be genetic lean body stature too.   6. Need for Tdap vaccination Tdap vaccine injection order send to her pharmacy.   7. Need for pneumococcal vaccine order send to her pharmacy.   8. Impacted cerumen of left ear States has debrox at home will apply drops then will follow up if lavage is needed.Recommended lavage but will call provider's office.   Family/ staff Communication: Reviewed plan of care with patient  Labs/tests ordered:  - CBC with Differential/Platelet; Future - CMP with eGFR([facility]) - TSH; Future - Lipid panel; Future  Time spent with patient 25 minutes >50% time spent counseling; reviewing medical record; tests; labs; and developing future plan of care  Sandrea Hughs, NP

## 2018-09-25 NOTE — Patient Instructions (Addendum)
Rebecca [name] , Thank you for taking time to come for your Medicare Wellness Visit. I appreciate your ongoing commitment to your health goals. Please review the following plan we discussed and let me know if I can assist you in the future.   Screening recommendations/referrals: Colonoscopy: up to date  Mammogramup to date  Bone Density up to date  Recommended yearly ophthalmology/optometry visit for glaucoma screening and checkup Recommended yearly dental visit for hygiene and checkup  Vaccinations: Influenza vaccine up to date  Pneumococcal vaccine ordered today  Tdap vaccine Ordered today  Shingles vaccine: Has had shingles     Advanced directives: Yes   Conditions/risks identified: Advance age female > 62 Yrs   Next appointment: 1 year    Preventive Care 66 Years and Older, Female Preventive care refers to lifestyle choices and visits with your health care provider that can promote health and wellness. What does preventive care include?  A yearly physical exam. This is also called an annual well check.  Dental exams once or twice a year.  Routine eye exams. Ask your health care provider how often you should have your eyes checked.  Personal lifestyle choices, including:  Daily care of your teeth and gums.  Regular physical activity.  Eating a healthy diet.  Avoiding tobacco and drug use.  Limiting alcohol use.  Practicing safe sex.  Taking low-dose aspirin every [place].  Taking vitamin and mineral supplements as recommended by your health care provider. What happens during an annual well check? The services and screenings done by your health care provider during your annual well check will depend on your age, overall health, lifestyle risk factors, and family history of disease. Counseling  Your health care provider may ask you questions about your:  Alcohol use.  Tobacco use.  Drug use.  Emotional well-being.  Home and relationship well-being.  Sexual  activity.  Eating habits.  History of falls.  Memory and ability to understand (cognition).  Work and work Statistician.  Reproductive health. Screening  You may have the following tests or measurements:  Height, weight, and BMI.  Blood pressure.  Lipid and cholesterol levels. These may be checked every 5 years, or more frequently if you are over 80 years old.  Skin check.  Lung cancer screening. You may have this screening every year starting at age 66 if you have a 30-pack-year history of smoking and currently smoke or have quit within the past 15 years.  Fecal occult blood test (FOBT) of the stool. You may have this test every year starting at age 66.  Flexible sigmoidoscopy or colonoscopy. You may have a sigmoidoscopy every 5 years or a colonoscopy every 10 years starting at age 77.  Hepatitis C blood test.  Hepatitis B blood test.  Sexually transmitted disease (STD) testing.  Diabetes screening. This is done by checking your blood sugar (glucose) after you have not eaten for a while (fasting). You may have this done every 1-3 years.  Bone density scan. This is done to screen for osteoporosis. You may have this done starting at age 41.  Mammogram. This may be done every 1-2 years. Talk to your health care provider about how often you should have regular mammograms. Talk with your health care provider about your test results, treatment options, and if necessary, the need for more tests. Vaccines  Your health care provider may recommend certain vaccines, such as:  Influenza vaccine. This is recommended every year.  Tetanus, diphtheria, and acellular pertussis (Tdap, Td)  vaccine. You may need a Td booster every 10 years.  Zoster vaccine. You may need this after age 38.  Pneumococcal 13-valent conjugate (PCV13) vaccine. One dose is recommended after age 33.  Pneumococcal polysaccharide (PPSV23) vaccine. One dose is recommended after age 91. Talk to your health care  provider about which screenings and vaccines you need and how often you need them. This information is not intended to replace advice given to you by your health care provider. Make sure you discuss any questions you have with your health care provider. Document Released: 04/15/2015 Document Revised: 12/07/2015 Document Reviewed: 01/18/2015 Elsevier Interactive Patient Education  2017 [place] Prevention in the Home Falls can cause injuries. They can happen to people of all ages. There are many things you can do to make your home safe and to help prevent falls. What can I do on the outside of my home?  Regularly fix the edges of walkways and driveways and fix any cracks.  Remove anything that might make you trip as you walk through a door, such as a raised step or threshold.  Trim any bushes or trees on the path to your home.  Use bright outdoor lighting.  Clear any walking paths of anything that might make someone trip, such as rocks or tools.  Regularly check to see if handrails are loose or broken. Make sure that both sides of any steps have handrails.  Any raised decks and porches should have guardrails on the edges.  Have any leaves, snow, or ice cleared regularly.  Use sand or salt on walking paths during winter.  Clean up any spills in your garage right away. This includes oil or grease spills. What can I do in the bathroom?  Use night lights.  Install grab bars by the toilet and in the tub and shower. Do not use towel bars as grab bars.  Use non-skid mats or decals in the tub or shower.  If you need to sit down in the shower, use a plastic, non-slip stool.  Keep the floor dry. Clean up any water that spills on the floor as soon as it happens.  Remove soap buildup in the tub or shower regularly.  Attach bath mats securely with double-sided non-slip rug tape.  Do not have throw rugs and other things on the floor that can make you trip. What can I do in  the bedroom?  Use night lights.  Make sure that you have a light by your bed that is easy to reach.  Do not use any sheets or blankets that are too big for your bed. They should not hang down onto the floor.  Have a firm chair that has side arms. You can use this for support while you get dressed.  Do not have throw rugs and other things on the floor that can make you trip. What can I do in the kitchen?  Clean up any spills right away.  Avoid walking on wet floors.  Keep items that you use a lot in easy-to-reach places.  If you need to reach something above you, use a strong step stool that has a grab bar.  Keep electrical cords out of the way.  Do not use floor polish or wax that makes floors slippery. If you must use wax, use non-skid floor wax.  Do not have throw rugs and other things on the floor that can make you trip. What can I do with my stairs?  Do not leave  any items on the stairs.  Make sure that there are handrails on both sides of the stairs and use them. Fix handrails that are broken or loose. Make sure that handrails are as long as the stairways.  Check any carpeting to make sure that it is firmly attached to the stairs. Fix any carpet that is loose or worn.  Avoid having throw rugs at the top or bottom of the stairs. If you do have throw rugs, attach them to the floor with carpet tape.  Make sure that you have a light switch at the top of the stairs and the bottom of the stairs. If you do not have them, ask someone to add them for you. What else can I do to help prevent falls?  Wear shoes that:  Do not have high heels.  Have rubber bottoms.  Are comfortable and fit you well.  Are closed at the toe. Do not wear sandals.  If you use a stepladder:  Make sure that it is fully opened. Do not climb a closed stepladder.  Make sure that both sides of the stepladder are locked into place.  Ask someone to hold it for you, if possible.  Clearly mark and  make sure that you can see:  Any grab bars or handrails.  First and last steps.  Where the edge of each step is.  Use tools that help you move around (mobility aids) if they are needed. These include:  Canes.  Walkers.  Scooters.  Crutches.  Turn on the lights when you go into a dark area. Replace any light bulbs as soon as they burn out.  Set up your furniture so you have a clear path. Avoid moving your furniture around.  If any of your floors are uneven, fix them.  If there are any pets around you, be aware of where they are.  Review your medicines with your doctor. Some medicines can make you feel dizzy. This can increase your chance of falling. Ask your doctor what other things that you can do to help prevent falls. This information is not intended to replace advice given to you by your health care provider. Make sure you discuss any questions you have with your health care provider. Document Released: 01/13/2009 Document Revised: 08/25/2015 Document Reviewed: 04/23/2014 Elsevier Interactive Patient Education  2017 [employer].

## 2018-10-01 NOTE — Progress Notes (Signed)
Provider: Ladeana Laplant FNP-C   Lauree Chandler, NP  Patient Care Team: Lauree Chandler, NP as PCP - General (Geriatric Medicine)  Extended Emergency Contact Information Primary Emergency Contact: [name] Address: [address] [place]          [place], [place] [address] [name] of [place] Phone: [phone] Mobile Phone: [phone] Relation: Spouse Secondary Emergency Contact: Laye,[name] Home Phone: [phone] Relation: None  Code Status: Full Code  Goals of care: Advanced Directive information Advanced Directives 09/25/2018  Does Patient Have a Medical Advance Directive? Yes  Type of Paramedic of [place];Living will  Does patient want to make changes to medical advance directive? No - Patient declined  Copy of [place] in Chart? No - copy requested     Chief Complaint  Patient presents with  . Medical Management of Chronic Issues    follow-up, discuss weight loss    HPI:  Pt is a 68 y.o. female seen today for medical management of chronic diseases.she is concerned about her weight loss.Her previous weight was 103 lbs (08/22/2018) has had 7 lbs weight loss since then.she states usually goes to the [facility] exercises by walking daily and does Yoga.she eats three meals daily and snacks in between.she continues to exercise to try and keep her cholesterol level down.she states all her family members are lean in body.she has had no issues with nausea,vomiting,diarrhea,signs of hypo/hyperthyroid.  Asthma - Has not required uses of the inhalers for the past one year.No wheezing,cough or shortness of breath.  Osteoporosis - she was previously on calcium/vit D supplement but states stopped taking the calcium due to constipation.completed course of Fosamax  in the past.last Dexa scan T-score -4.3  (08/15/2017).no recent falls or fractures.Does weight bearing exercises on a regular basis.    Allergies - states symptoms  were well controlled this spring.on Allegra 180 mg tablet daily.  Hyperlipidemia - latest chol 282,HDL 87,TRG 95 and LDL 174.she eats healthy ands exercises by walking.  Past Medical History:  Diagnosis Date  . Allergy    seasonal  . Asthma   . Osteoporosis   . Other hyperlipidemia   . Pneumonia   . Shingles    Past Surgical History:  Procedure Laterality Date  . WISDOM TOOTH EXTRACTION      No Known Allergies  Allergies as of 09/25/2018   No Known Allergies     Medication List       Accurate as of September 25, 2018 11:59 PM. If you have any questions, ask your nurse or doctor.        STOP taking these medications   albuterol 108 (90 Base) MCG/ACT inhaler Commonly known as: VENTOLIN HFA Stopped by: Sandrea Hughs, NP   budesonide-formoterol 80-4.5 MCG/ACT inhaler Commonly known as: SYMBICORT Stopped by: Sandrea Hughs, NP     TAKE these medications   fexofenadine 180 MG tablet Commonly known as: ALLEGRA Take 1 tablet (180 mg total) by mouth daily.   pneumococcal 23 valent vaccine 25 MCG/0.5ML injection Commonly known as: PNU-IMMUNE Inject 0.5 mLs into the muscle once for 1 dose.   Tdap 08-01-13.5 LF-MCG/0.5 injection Commonly known as: ADACEL Inject 0.5 mLs into the muscle once for 1 dose.       Review of Systems  Constitutional: Negative for appetite change, chills, fatigue and fever.  HENT: Negative for congestion, postnasal drip, rhinorrhea, sinus pressure, sinus pain, sneezing, sore throat and trouble swallowing.   Eyes: Positive for  visual disturbance. Negative for discharge, redness and itching.       Wears eye glasses   Respiratory: Negative for cough, chest tightness, shortness of breath and wheezing.   Cardiovascular: Negative for chest pain, palpitations and leg swelling.  Gastrointestinal: Negative for abdominal pain, constipation, diarrhea, nausea and vomiting.  Endocrine: Negative for cold intolerance, heat intolerance, polydipsia, polyphagia  and polyuria.  Genitourinary: Negative for difficulty urinating, dysuria, flank pain, frequency and urgency.  Musculoskeletal: Negative for arthralgias and gait problem.  Skin: Negative for color change, pallor and rash.  Neurological: Negative for dizziness, speech difficulty, weakness, light-headedness and headaches.  Hematological: Does not bruise/bleed easily.  Psychiatric/Behavioral: Negative for agitation, confusion and sleep disturbance. The patient is not nervous/anxious.     Immunization History  Administered Date(s) Administered  . Influenza, High Dose Seasonal PF 02/13/2018  . Influenza-Unspecified 12/31/2016  . Pneumococcal Conjugate-13 01/17/2017   Pertinent  Health Maintenance Due  Topic Date Due  . PNA vac Low Risk Adult (2 of 2 - PPSV23) 01/17/2018  . INFLUENZA VACCINE  11/01/2018  . MAMMOGRAM  08/16/2019  . COLONOSCOPY  08/22/2022  . DEXA SCAN  Completed   Fall Risk  09/25/2018 09/25/2018 07/10/2017  Falls in the past year? 0 0 No  Number falls in past yr: 0 0 -  Injury with Fall? 0 0 -    Vitals:   09/25/18 1034  BP: 110/60  Pulse: 93  Temp: 98.6 F (37 C)  TempSrc: Oral  SpO2: 96%  Weight: 96 lb (43.5 kg)  Height: 5' 5"  (1.651 m)   Body mass index is 15.98 kg/m. Physical Exam Vitals signs reviewed.  Constitutional:      General: She is not in acute distress.    Appearance: She is underweight. She is not ill-appearing.  HENT:     Head: Normocephalic.     Right Ear: Tympanic membrane, ear canal and external ear normal. There is no impacted cerumen.     Left Ear: There is impacted cerumen.     Nose: No congestion or rhinorrhea.     Mouth/Throat:     Mouth: Mucous membranes are moist.     Pharynx: Oropharynx is clear. No oropharyngeal exudate or posterior oropharyngeal erythema.  Eyes:     General: No scleral icterus.       Right eye: No discharge.        Left eye: No discharge.     Conjunctiva/sclera: Conjunctivae normal.     Pupils: Pupils are  equal, round, and reactive to light.  Neck:     Musculoskeletal: Normal range of motion. No neck rigidity or muscular tenderness.     Vascular: No carotid bruit.  Cardiovascular:     Rate and Rhythm: Normal rate and regular rhythm.     Pulses: Normal pulses.     Heart sounds: Normal heart sounds. No murmur. No friction rub. No gallop.   Pulmonary:     Effort: Pulmonary effort is normal. No respiratory distress.     Breath sounds: Normal breath sounds. No wheezing, rhonchi or rales.  Chest:     Chest wall: No tenderness.  Abdominal:     General: Abdomen is flat. Bowel sounds are normal. There is no distension.     Palpations: Abdomen is soft. There is no mass.     Tenderness: There is no abdominal tenderness. There is no right CVA tenderness, left CVA tenderness, guarding or rebound.  Musculoskeletal: Normal range of motion.  General: No swelling or tenderness.     Right lower leg: No edema.     Left lower leg: No edema.  Lymphadenopathy:     Cervical: No cervical adenopathy.  Skin:    General: Skin is warm and dry.     Coloration: Skin is not pale.     Findings: No bruising, erythema or rash.  Neurological:     Mental Status: She is alert and oriented to person, place, and time.     Cranial Nerves: No cranial nerve deficit.     Sensory: No sensory deficit.     Motor: No weakness.     Coordination: Coordination normal.     Gait: Gait normal.  Psychiatric:        Mood and Affect: Mood normal.        Behavior: Behavior normal.        Thought Content: Thought content normal.        Judgment: Judgment normal.    Labs reviewed: No results for input(s): NA, K, CL, CO2, GLUCOSE, BUN, CREATININE, CALCIUM, MG, PHOS in the last 8760 hours. No results for input(s): AST, ALT, ALKPHOS, BILITOT, PROT, ALBUMIN in the last 8760 hours. No results for input(s): WBC, NEUTROABS, HGB, HCT, MCV, PLT in the last 8760 hours. Lab Results  Component Value Date   TSH 1.35 11/20/2012   No  results found for: HGBA1C Lab Results  Component Value Date   CHOL 282 (H) 08/12/2017   HDL 87 08/12/2017   LDLCALC 174 (H) 08/12/2017   TRIG 95 08/12/2017   CHOLHDL 3.2 08/12/2017    Significant Diagnostic Results in last 30 days:  No results found.  Assessment/Plan 1. Hyperlipidemia, unspecified hyperlipidemia type Continue on heart healthy diet. - Lipid panel; Future  2. Underweight Has seen nutritionist.working on her diet to increase weight.  - CBC with Differential/Platelet; Future - CMP with eGFR([facility]) - TSH; Future  3. Age-related osteoporosis without current pathological fracture Continue on weight bearing exercise.Has stopped taking calcium/vit D due to constipation.will continue to monitor her Dexa-scan.  4. Mild intermittent asthma, unspecified whether complicated Asymptomatic.has not required use of inhalers in the past one year.   5. Body mass index (BMI) of 19 or less in adult Encouraged heart healthy foods,Protiens and shakes.  6. Need for Tdap vaccination Tdap vaccine send to pharmacy   7. Need for pneumococcal vaccine Pneumococcal vaccine send to pharmacy.  8. Impacted cerumen of left ear Recommended Debrox 6.5 otic solution 5 drops twice daily x 4 days then follow up for lavage but states has drops at home will notify provider if drops does not work.   Family/ staff Communication: Reviewed plan of care with patient  Labs/tests ordered:  - CBC with Differential/Platelet; Future - CMP with eGFR([facility]) - TSH; Future - Lipid panel; Future  Next visit: 6 months for medical management of chronic issues Time spent with patient 25 minutes >50% time spent counseling; reviewing medical record; tests; labs; and developing future plan of care   Sandrea Hughs, NP

## 2018-12-04 DIAGNOSIS — E1169 Type 2 diabetes mellitus with other specified complication: Secondary | ICD-10-CM | POA: Diagnosis not present

## 2018-12-04 DIAGNOSIS — E785 Hyperlipidemia, unspecified: Secondary | ICD-10-CM | POA: Diagnosis not present

## 2018-12-11 LAB — LIPID PANEL
Cholesterol: 247 mg/dL — ABNORMAL HIGH (ref <200)
HDL: 83 mg/dL (ref >=50)
LDL Cholesterol (Calc): 142 mg/dL (calc) — ABNORMAL HIGH
Non-HDL Cholesterol (Calc): 164 mg/dL (calc) — ABNORMAL HIGH (ref <130)
Total CHOL/HDL Ratio: 3 (calc) (ref <5.0)
Triglycerides: 102 mg/dL (ref <150)

## 2018-12-11 LAB — COMPLETE METABOLIC PANEL WITH GFR
AG Ratio: 2 (calc) (ref 1.0–2.5)
ALT: 12 U/L (ref 6–29)
AST: 19 U/L (ref 10–35)
Albumin: 4.7 g/dL (ref 3.6–5.1)
Alkaline phosphatase (APISO): 65 U/L (ref 37–153)
BUN: 11 mg/dL (ref 7–25)
CO2: 28 mmol/L (ref 20–32)
Calcium: 9.7 mg/dL (ref 8.6–10.4)
Chloride: 101 mmol/L (ref 98–110)
Creat: 0.78 mg/dL (ref 0.50–0.99)
GFR, Est African American: 92 mL/min/{1.73_m2} (ref >=60)
GFR, Est Non African American: 79 mL/min/{1.73_m2} (ref >=60)
Globulin: 2.3 g/dL (calc) (ref 1.9–3.7)
Glucose, Bld: 85 mg/dL (ref 65–99)
Potassium: 4.2 mmol/L (ref 3.5–5.3)
Sodium: 139 mmol/L (ref 135–146)
Total Bilirubin: 0.8 mg/dL (ref 0.2–1.2)
Total Protein: 7 g/dL (ref 6.1–8.1)

## 2018-12-11 LAB — TSH: TSH: 3.83 mIU/L (ref 0.40–4.50)

## 2018-12-18 ENCOUNTER — Ambulatory Visit (INDEPENDENT_AMBULATORY_CARE_PROVIDER_SITE_OTHER): Payer: Medicare Other | Admitting: Family

## 2018-12-18 ENCOUNTER — Encounter: Payer: Self-pay | Admitting: Family

## 2018-12-18 ENCOUNTER — Other Ambulatory Visit: Payer: Self-pay

## 2018-12-18 DIAGNOSIS — J4520 Mild intermittent asthma, uncomplicated: Secondary | ICD-10-CM

## 2018-12-18 DIAGNOSIS — Z7189 Other specified counseling: Secondary | ICD-10-CM

## 2018-12-18 NOTE — Patient Instructions (Addendum)
Get COVID-19 testing at [address] in [place] at 9 am -3 pm.    This information is directly available on the CDC website: RunningShows.co.za.html    Source:CDC Reference to specific commercial products, manufacturers, companies, or trademarks does not constitute its endorsement or recommendation by the [employer], [employer], or Centers for [employer] and Prevention.

## 2018-12-18 NOTE — Progress Notes (Signed)
This service is provided via telemedicine  No vital signs collected/recorded due to the encounter was a telemedicine visit.   Location of patient (ex: home, work):  Home   Patient consents to a telephone visit:  Yes   Location of the provider (ex: office, home):  Office   Name of any referring provider:  Sherrie Mustache, NP   Names of all persons participating in the telemedicine service and their role in the encounter:  [name], NP, Ruthell Rummage CMA, Santiago Glad Clucas   Time spent on call: Ruthell Rummage CMA,  Pana 6 Minutes on phone with patient     Location:      Place of Service:    Provider: Dinah Ngetich FNP-C  Lauree Chandler, NP  Patient Care Team: Lauree Chandler, NP as PCP - General (Geriatric Medicine)  Extended Emergency Contact Information Primary Emergency Contact: [name] Address: [address] [place]          [place], [place] [address] [name] of [place] Phone: [phone] Mobile Phone: [phone] Relation: Spouse Secondary Emergency Contact: Eke,[name] Home Phone: [phone] Relation: None  Code Status:  Goals of care: Advanced Directive information Advanced Directives 09/25/2018  Does Patient Have a Medical Advance Directive? Yes  Type of Paramedic of [place];Living will  Does patient want to make changes to medical advance directive? No - Patient declined  Copy of [place] in Chart? No - copy requested     Chief Complaint  Patient presents with  . Acute Visit    Patient request COVID testing, needs before they can go on retreat     HPI:  Pt is a 68 y.o. female seen today for an acute visit for request for COVID-19 testing.she states will be going on a retreat on October 10,2020.she states testing is required prior to attending the retreat.she has not be in contact with any sick person with COVID-19.she wears her Facial mask,practices hand hygiene and social distancing. She  reports no acute issues this visit.she has a significant medical history of mild asthma states has a rescue inhaler will call provider's office so that we can add to her medication list.     Past Medical History:  Diagnosis Date  . Allergy    seasonal  . Asthma   . Osteoporosis   . Other hyperlipidemia   . Pneumonia   . Shingles    Past Surgical History:  Procedure Laterality Date  . WISDOM TOOTH EXTRACTION      No Known Allergies  Outpatient Encounter Medications as of 12/18/2018  Medication Sig  . fexofenadine (ALLEGRA) 180 MG tablet Take 1 tablet (180 mg total) by mouth daily.   No facility-administered encounter medications on file as of 12/18/2018.     Review of Systems  Constitutional: Negative for chills, fatigue and fever.  HENT: Negative for congestion, rhinorrhea, sinus pressure, sinus pain, sneezing and sore throat.   Eyes: Negative for discharge, redness and itching.  Respiratory: Negative for cough, chest tightness, shortness of breath and wheezing.        Hx mild Asthma has rescue  inhaler  Cardiovascular: Negative for chest pain, palpitations and leg swelling.  Gastrointestinal: Negative for abdominal distention, abdominal pain, diarrhea, nausea and vomiting.  Skin: Negative for color change, pallor and rash.  Neurological: Negative for dizziness, light-headedness and headaches.  Psychiatric/Behavioral: Negative for agitation, confusion and sleep disturbance. The patient is not nervous/anxious.     Immunization History  Administered Date(s)  Administered  . Influenza, High Dose Seasonal PF 02/13/2018  . Influenza-Unspecified 12/31/2016  . Pneumococcal Conjugate-13 01/17/2017   Pertinent  Health Maintenance Due  Topic Date Due  . PNA vac Low Risk Adult (2 of 2 - PPSV23) 01/17/2018  . INFLUENZA VACCINE  11/01/2018  . MAMMOGRAM  08/16/2019  . COLONOSCOPY  08/22/2022  . DEXA SCAN  Completed   Fall Risk  12/18/2018 09/25/2018 09/25/2018 07/10/2017  Falls in  the past year? 0 0 0 No  Number falls in past yr: 0 0 0 -  Injury with Fall? 0 0 0 -   There were no vitals filed for this visit. There is no height or weight on file to calculate BMI. Physical Exam  Unable to complete on Telephone visit.   Labs reviewed: Recent Labs    12/04/18 0721  NA 139  K 4.2  CL 101  CO2 28  GLUCOSE 85  BUN 11  CREATININE 0.78  CALCIUM 9.7   Recent Labs    12/04/18 0721  AST 19  ALT 12  BILITOT 0.8  PROT 7.0   No results for input(s): WBC, NEUTROABS, HGB, HCT, MCV, PLT in the last 8760 hours. Lab Results  Component Value Date   TSH 3.83 12/04/2018   No results found for: HGBA1C Lab Results  Component Value Date   CHOL 247 (H) 12/04/2018   HDL 83 12/04/2018   LDLCALC 142 (H) 12/04/2018   TRIG 102 12/04/2018   CHOLHDL 3.0 12/04/2018    Significant Diagnostic Results in last 30 days:  No results found.  Assessment/Plan 1. Educated About Covid-19 Virus Infection Go for a retreat in October.Facial mask,hand hygiene and social distancing encouraged.Information on COVID-19 also send via mail on AVS.Patient to get COVID-19 testing at [facility] in [place] at 9 am -3pm.will place order this visit.  - Novel Coronavirus, NAA ([facility]); Future  2. Mild intermittent asthma, unspecified whether complicated Reports no symptoms.Has a rescue inhaler which is not on the med list.she will call CMA to update this.  - Novel Coronavirus, NAA ([facility]); Future  Family/ staff Communication: Reviewed plan of care with patient.   Labs/tests ordered:  - Novel Coronavirus, NAA ([facility]); Future  Spent 11 minutes of non-face to face with patient    Sandrea Hughs, NP

## 2019-01-05 ENCOUNTER — Other Ambulatory Visit: Payer: Self-pay | Admitting: Registered"

## 2019-01-05 DIAGNOSIS — Z20822 Contact with and (suspected) exposure to covid-19: Secondary | ICD-10-CM

## 2019-01-06 LAB — NOVEL CORONAVIRUS, NAA: SARS-CoV-2, NAA: NOT DETECTED

## 2019-02-06 ENCOUNTER — Ambulatory Visit (HOSPITAL_COMMUNITY)
Admission: EM | Admit: 2019-02-06 | Discharge: 2019-02-06 | Disposition: A | Discharge status: Home / Self Care | Payer: Medicare Other | Attending: Family Medicine | Admitting: Family Medicine

## 2019-02-06 ENCOUNTER — Telehealth: Payer: Self-pay

## 2019-02-06 ENCOUNTER — Other Ambulatory Visit: Payer: Self-pay

## 2019-02-06 ENCOUNTER — Encounter (HOSPITAL_COMMUNITY): Payer: Self-pay

## 2019-02-06 DIAGNOSIS — B09 Unspecified viral infection characterized by skin and mucous membrane lesions: Secondary | ICD-10-CM | POA: Diagnosis not present

## 2019-02-06 MED ORDER — VALACYCLOVIR HCL 1 G PO TABS: 1000.0000 mg | ORAL_TABLET | Freq: Three times a day (TID) | ORAL | 1 refills | Status: DC

## 2019-02-06 NOTE — ED Triage Notes (Signed)
Pt states she is having a rash x 2 days in her right eye. Pt states she had shingles around her eyes before. Pt PCP told the pt she needs to be seeing at the ED or UC for the problem in her right eye.

## 2019-02-06 NOTE — Telephone Encounter (Signed)
Recommend her to get checked out in ED or urgent care since it's too close to the eye.

## 2019-02-06 NOTE — Telephone Encounter (Signed)
Discussed with the patient.

## 2019-02-06 NOTE — Telephone Encounter (Signed)
Patient states she is having a flare up on her eye lid. She has had shingles in the past. She wants to be sure it doesn't get in her eye. She also gets cold sores but says its more bumpy. Started yesterday, it doesn't seem to be getting better. She has a natural ointment that she has used before but no change. Patient states she last saw Dinah NP.

## 2019-02-06 NOTE — Discharge Instructions (Addendum)
Take the valacyclovir 2 tablets, repeat in 12 hours ( one [place] ) Take at first sign of infection Get medical care for eye symptoms

## 2019-02-06 NOTE — ED Provider Notes (Signed)
[place]    CSN: [id number] Arrival date & time: 02/06/19  [id number]      History   Chief Complaint Chief Complaint  Patient presents with  . Rash    HPI Rebecca [name] is a 68 y.o. female.   HPI  Patient has recurring rash.  It happens around her right eye.  She states that she has been told that it was a herpes rash, simplex versus zoster.  The last doctor that told her told her that it was definitely shingles.  At that point it was up in her forehead as well a characteristic distribution. Usually the outbreaks are mild.  She uses a homeopathic cream.  This is soothing for her.  She has not taken Valtrex except for the first outbreak.  This time it feels little bit worse.  Its close to her eye. No visual symptoms.  No eye irritation.  No eye redness or discharge.  No photophobia  Past Medical History:  Diagnosis Date  . Allergy    seasonal  . Asthma   . Osteoporosis   . Other hyperlipidemia   . Pneumonia   . Shingles     Patient Active Problem List   Diagnosis Date Noted  . Age-related osteoporosis without current pathological fracture 07/10/2017  . Seasonal allergies 07/10/2017  . Hyperlipidemia 07/10/2017  . Mild intermittent asthma 07/10/2017    Past Surgical History:  Procedure Laterality Date  . WISDOM TOOTH EXTRACTION      OB History   No obstetric history on file.      Home Medications    Prior to Admission medications   Medication Sig Start Date End Date Taking? Authorizing Provider  albuterol (VENTOLIN HFA) 108 (90 Base) MCG/ACT inhaler Inhale 1 puff into the lungs every 6 (six) hours as needed for wheezing or shortness of breath.    [provider]  budesonide-formoterol (SYMBICORT) 80-4.5 MCG/ACT inhaler Inhale 2 puffs into the lungs 2 (two) times daily. Takes in morning and evening    [provider]  fexofenadine (ALLEGRA) 180 MG tablet Take 1 tablet (180 mg total) by mouth daily. 07/10/17   Lauree Chandler, NP   valACYclovir (VALTREX) 1000 MG tablet Take 1 tablet (1,000 mg total) by mouth 3 (three) times daily. 02/06/19   Raylene Everts, MD    Family History Family History  Problem Relation Age of Onset  . Cancer - Colon Mother 31  . Osteoporosis Mother   . Bipolar disorder Mother   . Mental illness Mother   . Colon cancer Mother   . Other Father 30       airplane accident  . Anxiety disorder Daughter 82  . Diabetes Mellitus II Paternal Grandfather   . Esophageal cancer Neg Hx   . Stomach cancer Neg Hx   . Rectal cancer Neg Hx     Social History Social History   Tobacco Use  . Smoking status: Never Smoker  . Smokeless tobacco: Never Used  Substance Use Topics  . Alcohol use: Yes    Alcohol/week: 7.0 standard drinks    Types: 7 Glasses of wine per week  . Drug use: Never     Allergies   Patient has no known allergies.   Review of Systems Review of Systems  Constitutional: Negative for chills and fever.  HENT: Negative for ear pain and sore throat.   Eyes: Negative for photophobia, pain, discharge, redness, itching and visual disturbance.  Respiratory: Negative for cough and shortness of breath.  Cardiovascular: Negative for chest pain and palpitations.  Gastrointestinal: Negative for abdominal pain and vomiting.  Genitourinary: Negative for dysuria and hematuria.  Musculoskeletal: Negative for arthralgias and back pain.  Skin: Positive for rash. Negative for color change.  Neurological: Negative for seizures and syncope.  All other systems reviewed and are negative.    Physical Exam Triage Vital Signs ED Triage Vitals  Enc Vitals Group     BP 02/06/19 1906 (!) 123/59     Pulse Rate 02/06/19 1906 84     Resp 02/06/19 1906 16     Temp 02/06/19 1906 98.4 F (36.9 C)     Temp Source 02/06/19 1906 Oral     SpO2 02/06/19 1906 98 %     Weight --      Height --      Head Circumference --      Peak Flow --      Pain Score 02/06/19 1904 0     Pain Loc --       Pain Edu? --      Excl. in [place]? --    No data found.  Updated Vital Signs BP (!) 123/59 (BP Location: Right Arm)   Pulse 84   Temp 98.4 F (36.9 C) (Oral)   Resp 16   SpO2 98%   Visual Acuity Right Eye Distance: 20/20 Left Eye Distance: 20/20 Bilateral Distance: 20/20  Right Eye Near:   Left Eye Near:    Bilateral Near:     Physical Exam Constitutional:      General: She is not in acute distress.    Appearance: She is well-developed.     Comments: Very lean.  No distress  HENT:     Head: Normocephalic and atraumatic.     Mouth/Throat:     Comments: Mask in place Eyes:     Conjunctiva/sclera: Conjunctivae normal.     Pupils: Pupils are equal, round, and reactive to light.   Neck:     Musculoskeletal: Normal range of motion.  Cardiovascular:     Rate and Rhythm: Normal rate.  Pulmonary:     Effort: Pulmonary effort is normal. No respiratory distress.  Abdominal:     General: There is no distension.     Palpations: Abdomen is soft.  Musculoskeletal: Normal range of motion.  Skin:    General: Skin is warm and dry.  Neurological:     Mental Status: She is alert.      UC Treatments / Results  Labs (all labs ordered are listed, but only abnormal results are displayed) Labs Reviewed - No data to display  EKG   Radiology No results found.  Procedures Procedures (including critical care time)  Medications Ordered in UC Medications - No data to display  Initial Impression / Assessment and Plan / UC Course  I have reviewed the triage vital signs and the nursing notes.  Pertinent labs & imaging results that were available during my care of the patient were reviewed by me and considered in my medical decision making (see chart for details).     I told the patient that with recurring viral rash this close to her I think she should take Valtrex at the first sign of an outbreak.  See an eye doctor immediately if she has any irritation of the eye itself  Final Clinical Impressions(s) / UC Diagnoses   Final diagnoses:  Viral rash     Discharge Instructions     Take the valacyclovir 2 tablets, repeat  in 12 hours ( one [place] ) Take at first sign of infection Get medical care for eye symptoms    ED Prescriptions    Medication Sig Dispense Auth. Provider   valACYclovir (VALTREX) 1000 MG tablet Take 1 tablet (1,000 mg total) by mouth 3 (three) times daily. 21 tablet Raylene Everts, MD     PDMP not reviewed this encounter.   Raylene Everts, MD 02/06/19 [phone]

## 2019-02-10 ENCOUNTER — Other Ambulatory Visit: Payer: Self-pay | Admitting: Family

## 2019-02-10 NOTE — Telephone Encounter (Signed)
Patient requesting refill for symbicort inhaler. Last office visit patient states she has not had to use inhaler in over a year. Routing to provider for further review and approval.

## 2019-04-09 ENCOUNTER — Ambulatory Visit (INDEPENDENT_AMBULATORY_CARE_PROVIDER_SITE_OTHER): Payer: Medicare Other | Admitting: Family

## 2019-04-09 ENCOUNTER — Encounter: Payer: Self-pay | Admitting: Family

## 2019-04-09 ENCOUNTER — Other Ambulatory Visit: Payer: Self-pay

## 2019-04-09 VITALS — BP 110/74 | HR 96 | Temp 97.9°F | Ht 65.0 in | Wt 99.0 lb

## 2019-04-09 DIAGNOSIS — Z23 Encounter for immunization: Secondary | ICD-10-CM

## 2019-04-09 DIAGNOSIS — H6122 Impacted cerumen, left ear: Secondary | ICD-10-CM

## 2019-04-09 DIAGNOSIS — J302 Other seasonal allergic rhinitis: Secondary | ICD-10-CM | POA: Diagnosis not present

## 2019-04-09 DIAGNOSIS — Z1159 Encounter for screening for other viral diseases: Secondary | ICD-10-CM | POA: Diagnosis not present

## 2019-04-09 DIAGNOSIS — Z681 Body mass index (BMI) 19 or less, adult: Secondary | ICD-10-CM

## 2019-04-09 DIAGNOSIS — J4520 Mild intermittent asthma, uncomplicated: Secondary | ICD-10-CM | POA: Diagnosis not present

## 2019-04-09 DIAGNOSIS — R636 Underweight: Secondary | ICD-10-CM

## 2019-04-09 DIAGNOSIS — E785 Hyperlipidemia, unspecified: Secondary | ICD-10-CM

## 2019-04-09 NOTE — Progress Notes (Signed)
Provider: Toni Demo FNP-C   Lauree Chandler, NP  Patient Care Team: Lauree Chandler, NP as PCP - General (Geriatric Medicine)  Extended Emergency Contact Information Primary Emergency Contact: [name] Address: [address] [place]          [place], [place] [address] [name] of [place] Phone: [phone] Mobile Phone: [phone] Relation: Spouse Secondary Emergency Contact: Villarin,[name] Home Phone: [phone] Relation: None  Code Status:  Full code  Goals of care: Advanced Directive information Advanced Directives 09/25/2018  Does Patient Have a Medical Advance Directive? Yes  Type of Paramedic of [place];Living will  Does patient want to make changes to medical advance directive? No - Patient declined  Copy of [place] in Chart? No - copy requested     Chief Complaint  Patient presents with  . Medical Management of Chronic Issues    6 month follow up, patient states she is having some pain in left elbow   . Quality Metric Gaps    Hepatitis C Screening, patient is not interested in tetanus vaccine at this time, Pneumonia vaccine      HPI:  Pt is a 69 y.o. female seen today for 6 month follow up for medical management of chronic diseases.she reports some intermittent   left elbow pain whenever she use her hand to pour out food to a serving bowel or turns hand a certain way.Pain sometimes occurs at night she lie on the hand.she describes as mild pain.she denies any numbness,tingling or weakness on the hand.unclear how long she has had the pain.she has iced elbow and warm compressor with some relief.she does not recall any injuries or repetitive motion of the hand.   Asthma - states uses Symbicort 80-4.5 inhaler and Albuterol inhaler as needed though supposed to use Symbicort twice daily.she denies any breathing problems.  Herpes rash - she was seen in the urgent care 02/06/2019 for right eye rash was  treated with Valtrex.states there was no eye involvement or changes in her vision.she was given a script to have at hand and instructed to use whenever she felt the symptoms recur.states took valtrex when she felt similar symptoms last month.she request for a refill.No recent rash.  Hyperlipidemia - latest LDL 142,Chol 247,triglycerides 102 she states her cholesterol is more genetic.she eats a healthy diet and exercise by walking 30 minutes daily. She drinks at least 1 glass of water daily.   Seasonal allergies -  Fexofenadine 180 mg tablet daily effective.     Health maintenance  Hepatitis C Screening considers self as low risk for hep C. She is due for  tetanus vaccine but states not interested at this time.Also due for Pneumococcal 23 vaccine.she agrees to get vaccine today.she denies any URI symptoms.states has been following CDC guidelines social distancing,wearing facial mask and hand hygiene.stays home most of the time.    Past Medical History:  Diagnosis Date  . Allergy    seasonal  . Asthma   . Osteoporosis   . Other hyperlipidemia   . Pneumonia   . Shingles    Past Surgical History:  Procedure Laterality Date  . WISDOM TOOTH EXTRACTION      No Known Allergies  Allergies as of 04/09/2019   No Known Allergies     Medication List       Accurate as of April 09, 2019  8:48 AM. If you have any questions, ask your nurse or doctor.  albuterol 108 (90 Base) MCG/ACT inhaler Commonly known as: VENTOLIN HFA Inhale 1 puff into the lungs every 6 (six) hours as needed for wheezing or shortness of breath.   fexofenadine 180 MG tablet Commonly known as: ALLEGRA Take 1 tablet (180 mg total) by mouth daily.   Symbicort 80-4.5 MCG/ACT inhaler Generic drug: budesonide-formoterol TAKE 2 PUFFS BY MOUTH TWICE A [place]   valACYclovir 1000 MG tablet Commonly known as: VALTREX Take 1 tablet (1,000 mg total) by mouth 3 (three) times daily.       Review of Systems    Constitutional: Negative for appetite change, chills, fatigue and fever.  HENT: Negative for congestion, sinus pressure, sinus pain, sneezing and sore throat.        Seasonal allergies   Eyes: Negative for discharge, redness, itching and visual disturbance.  Respiratory: Negative for cough, chest tightness, shortness of breath and wheezing.   Cardiovascular: Negative for chest pain, palpitations and leg swelling.  Gastrointestinal: Negative for abdominal distention, abdominal pain, blood in stool, constipation, diarrhea, nausea and vomiting.  Endocrine: Negative for cold intolerance, heat intolerance, polydipsia, polyphagia and polyuria.  Genitourinary: Negative for decreased urine volume, difficulty urinating, dysuria, flank pain, frequency and urgency.  Musculoskeletal: Positive for arthralgias. Negative for gait problem and joint swelling.       Left elbow intermittent   Skin: Negative for color change, pallor and rash.  Neurological: Negative for dizziness, speech difficulty, weakness, light-headedness, numbness and headaches.  Hematological: Does not bruise/bleed easily.  Psychiatric/Behavioral: Negative for agitation, behavioral problems and sleep disturbance. The patient is not nervous/anxious.     Immunization History  Administered Date(s) Administered  . Influenza, High Dose Seasonal PF 02/13/2018, 01/06/2019  . Influenza-Unspecified 12/31/2016  . Pneumococcal Conjugate-13 01/17/2017   Pertinent  Health Maintenance Due  Topic Date Due  . PNA vac Low Risk Adult (2 of 2 - PPSV23) 01/17/2018  . MAMMOGRAM  08/16/2019  . COLONOSCOPY  08/22/2022  . INFLUENZA VACCINE  Completed  . DEXA SCAN  Completed   Fall Risk  12/18/2018 09/25/2018 09/25/2018 07/10/2017  Falls in the past year? 0 0 0 No  Number falls in past yr: 0 0 0 -  Injury with Fall? 0 0 0 -    Vitals:   04/09/19 0829  BP: 110/74  Pulse: 96  Temp: 97.9 F (36.6 C)  TempSrc: Temporal  SpO2: 98%  Weight: 99 lb  (44.9 kg)  Height: 5' 5"  (1.651 m)   Body mass index is 16.47 kg/m. Physical Exam Vitals reviewed.  Constitutional:      Appearance: She is underweight.  HENT:     Head: Normocephalic.     Right Ear: Tympanic membrane, ear canal and external ear normal. There is no impacted cerumen.     Left Ear: There is impacted cerumen.     Ears:     Comments: Left ear lavaged with warm water moderate amounts of cerumen removed with alligator forceps.patient tolerated procedure well.TM clear no signs of infection.     Nose: Nose normal. No congestion or rhinorrhea.     Mouth/Throat:     Mouth: Mucous membranes are moist.     Pharynx: Oropharynx is clear. No oropharyngeal exudate or posterior oropharyngeal erythema.  Eyes:     General: No scleral icterus.       Right eye: No discharge.        Left eye: No discharge.     Extraocular Movements: Extraocular movements intact.     Conjunctiva/sclera: Conjunctivae normal.  Pupils: Pupils are equal, round, and reactive to light.  Neck:     Vascular: No carotid bruit.  Cardiovascular:     Rate and Rhythm: Normal rate and regular rhythm.     Pulses: Normal pulses.     Heart sounds: Normal heart sounds. No murmur. No friction rub. No gallop.   Pulmonary:     Effort: Pulmonary effort is normal. No respiratory distress.     Breath sounds: Normal breath sounds. No wheezing, rhonchi or rales.  Chest:     Chest wall: No tenderness.  Abdominal:     General: Bowel sounds are normal. There is no distension.     Palpations: Abdomen is soft. There is no mass.     Tenderness: There is no abdominal tenderness. There is no right CVA tenderness, left CVA tenderness, guarding or rebound.  Musculoskeletal:        General: No swelling or tenderness. Normal range of motion.     Cervical back: Normal range of motion. No rigidity or tenderness.     Right lower leg: No edema.     Left lower leg: No edema.  Lymphadenopathy:     Cervical: No cervical adenopathy.   Skin:    General: Skin is warm and dry.     Coloration: Skin is not pale.     Findings: No bruising, erythema or rash.  Neurological:     Mental Status: She is alert and oriented to person, place, and time.     Cranial Nerves: No cranial nerve deficit.     Sensory: No sensory deficit.     Motor: No weakness.     Coordination: Coordination normal.     Gait: Gait normal.  Psychiatric:        Mood and Affect: Mood normal.        Behavior: Behavior normal.        Thought Content: Thought content normal.        Judgment: Judgment normal.    Labs reviewed: Recent Labs    12/04/18 0721  NA 139  K 4.2  CL 101  CO2 28  GLUCOSE 85  BUN 11  CREATININE 0.78  CALCIUM 9.7   Recent Labs    12/04/18 0721  AST 19  ALT 12  BILITOT 0.8  PROT 7.0   No results for input(s): WBC, NEUTROABS, HGB, HCT, MCV, PLT in the last 8760 hours. Lab Results  Component Value Date   TSH 3.83 12/04/2018   No results found for: HGBA1C Lab Results  Component Value Date   CHOL 247 (H) 12/04/2018   HDL 83 12/04/2018   LDLCALC 142 (H) 12/04/2018   TRIG 102 12/04/2018   CHOLHDL 3.0 12/04/2018    Significant Diagnostic Results in last 30 days:  No results found.  Assessment/Plan  1. Mild intermittent asthma, unspecified whether complicated Symptoms under control.encouraged to use Symbicort inhaler twice daily as directed instead of as needed.continue on albuterol. - CBC with Differential/Platelet; Future - CMP with eGFR([facility]); Future  2. Hyperlipidemia, unspecified hyperlipidemia type LDL not at goal.thinks it's more genetic since has relative who have high cholesterol.continue on dietary and lifestyle modification.  - Lipid panel; Future  3. Seasonal allergies Continue on Fexofenadine 180 mg tablet daily.  4. Encounter for hepatitis C screening test for low risk patient Low risk. - Hep C Antibody; Future  5. Impacted cerumen of left ear Left ear lavaged with warm water moderate  amounts of cerumen removed with alligator forceps.patient tolerated procedure well.TM clear  no signs of infection.   6. Underweight Reports good appetite.weight stable compared to previous visit.  - TSH; Future  7. Body mass index (BMI) of 19 or less in adult BMI 16.47 continue to monitor.  8. Need for 23-polyvalent pneumococcal polysaccharide vaccine Afebrile. - Pneumococcal polysaccharide vaccine 23-valent greater than or equal to 2 yo subcutaneous/IM administered by CMA.No reactions reported.   9.Herper zoster  Had rash 02/06/19 was treated with valtrex.she request refill to keep at hand.  - Valtrex 1 Gm tablet one by mouth three times daily  Family/ staff Communication: Reviewed plan of care with patient.  Labs/tests ordered:  - CBC with Differential/Platelet; Future - CMP with eGFR([facility]); Future - Lipid panel; Future - TSH; Future - Hep C Antibody; Future  Sandrea Hughs, NP

## 2019-04-10 NOTE — Addendum Note (Signed)
Addended by: Ruthell Rummage A on: 04/10/2019 09:50 AM   Modules accepted: Orders

## 2019-05-03 ENCOUNTER — Ambulatory Visit: Payer: Medicare Other

## 2019-05-08 ENCOUNTER — Ambulatory Visit: Payer: Medicare Other

## 2019-05-10 ENCOUNTER — Ambulatory Visit: Payer: Medicare Other

## 2019-09-25 ENCOUNTER — Ambulatory Visit: Payer: Medicare Other | Admitting: Family

## 2019-09-29 ENCOUNTER — Encounter: Payer: Medicare Other | Admitting: Nurse Practitioner

## 2019-10-06 ENCOUNTER — Other Ambulatory Visit: Payer: Medicare Other

## 2019-10-08 ENCOUNTER — Ambulatory Visit: Payer: Medicare Other | Admitting: Family

## 2019-10-09 ENCOUNTER — Ambulatory Visit: Payer: Medicare Other | Admitting: Family

## 2019-11-12 ENCOUNTER — Encounter: Payer: Self-pay | Admitting: Nurse Practitioner

## 2019-11-27 ENCOUNTER — Other Ambulatory Visit: Payer: Self-pay

## 2019-11-27 ENCOUNTER — Ambulatory Visit (INDEPENDENT_AMBULATORY_CARE_PROVIDER_SITE_OTHER): Payer: Medicare Other | Admitting: Family

## 2019-11-27 ENCOUNTER — Encounter: Payer: Self-pay | Admitting: Family

## 2019-11-27 VITALS — BP 110/70 | HR 88 | Temp 97.3°F | Resp 16 | Ht 65.0 in | Wt 97.8 lb

## 2019-11-27 DIAGNOSIS — Z23 Encounter for immunization: Secondary | ICD-10-CM

## 2019-11-27 DIAGNOSIS — E785 Hyperlipidemia, unspecified: Secondary | ICD-10-CM | POA: Diagnosis not present

## 2019-11-27 DIAGNOSIS — J302 Other seasonal allergic rhinitis: Secondary | ICD-10-CM

## 2019-11-27 DIAGNOSIS — J4520 Mild intermittent asthma, uncomplicated: Secondary | ICD-10-CM

## 2019-11-27 DIAGNOSIS — L989 Disorder of the skin and subcutaneous tissue, unspecified: Secondary | ICD-10-CM

## 2019-11-27 DIAGNOSIS — Z1231 Encounter for screening mammogram for malignant neoplasm of breast: Secondary | ICD-10-CM

## 2019-11-27 NOTE — Patient Instructions (Signed)
-   Please get your Tdap vaccine at the pharmacy

## 2019-11-27 NOTE — Progress Notes (Signed)
Provider: Jarmarcus Wambold FNP-C   Lauree Chandler, NP  Patient Care Team: Lauree Chandler, NP as PCP - General (Geriatric Medicine)  Extended Emergency Contact Information Primary Emergency Contact: [name] Address: [address] [place]          [place], [place] [address] [name] of [place] Phone: [phone] Mobile Phone: [phone] Relation: Spouse Secondary Emergency Contact: Matheney,[name] Home Phone: [phone] Relation: None  Code Status: Full Code  Goals of care: Advanced Directive information Advanced Directives 11/27/2019  Does Patient Have a Medical Advance Directive? Yes  Type of Advance Directive Living will;Healthcare Power of [place];Out of facility DNR (pink MOST or yellow form)  Does patient want to make changes to medical advance directive? No - Patient declined  Copy of [place] in Chart? No - copy requested  Would patient like information on creating a medical advance directive? No - Patient declined     Chief Complaint  Patient presents with  . Medical Management of Chronic Issues    Routine Visit.   Marland Kitchen Health Maintenance    Discuss the need for Hepatitis C Screening, and Mammogram.   . Immunizations    Discuss the need for Influenza Vaccine.     HPI:  Pt is a 69 y.o. female seen today for 6 months follow up for medical management of chronic diseases.she has a   She is due for a mammogram,TDap vaccine and Influenza vaccine.she states will get Influenza vaccine at her pharmacy.Tdap ordered in previous visit but didn't get it at her pharmacy.will reorder today.Also has orders for fasting labs didn't get done.I've discussed with her to come fasting and get lab work.Not fasting today.  Hyperlipidemia - Latest LDL 142,Chol 247 with normal TRG.discussed low carbo,low saturated fats diet and high vegetables and exercise.  Mild Asthma - has not had any wheezing or cough.on Albuterol every 6 hrs as needed and  symbicort.   Past Medical History:  Diagnosis Date  . Allergy    seasonal  . Asthma   . Osteoporosis   . Other hyperlipidemia   . Pneumonia   . Shingles    Past Surgical History:  Procedure Laterality Date  . WISDOM TOOTH EXTRACTION      No Known Allergies  Allergies as of 11/27/2019   No Known Allergies     Medication List       Accurate as of November 27, 2019  8:53 AM. If you have any questions, ask your nurse or doctor.        STOP taking these medications   valACYclovir 1000 MG tablet Commonly known as: VALTREX Stopped by: Sandrea Hughs, NP     TAKE these medications   albuterol 108 (90 Base) MCG/ACT inhaler Commonly known as: VENTOLIN HFA Inhale 1 puff into the lungs every 6 (six) hours as needed for wheezing or shortness of breath.   fexofenadine 180 MG tablet Commonly known as: ALLEGRA Take 1 tablet (180 mg total) by mouth daily.   Symbicort 80-4.5 MCG/ACT inhaler Generic drug: budesonide-formoterol TAKE 2 PUFFS BY MOUTH TWICE A Leoni       Review of Systems  Constitutional: Negative for appetite change, chills, fatigue, fever and unexpected weight change.  HENT: Negative for congestion, rhinorrhea, sinus pressure, sinus pain, sneezing and sore throat.   Eyes: Negative for discharge, redness, itching and visual disturbance.       Wears eye glasses   Respiratory: Negative for cough, chest tightness, shortness of breath and wheezing.  Cardiovascular: Negative for chest pain, palpitations and leg swelling.  Gastrointestinal: Negative for abdominal distention, abdominal pain, constipation, diarrhea, nausea and vomiting.  Endocrine: Negative for cold intolerance, heat intolerance, polydipsia, polyphagia and polyuria.  Genitourinary: Negative for difficulty urinating, dysuria, flank pain, frequency and urgency.  Musculoskeletal: Negative for arthralgias, gait problem, joint swelling and myalgias.  Skin: Negative for color change, pallor and rash.   Neurological: Negative for dizziness, seizures, speech difficulty, weakness, light-headedness, numbness and headaches.  Hematological: Does not bruise/bleed easily.  Psychiatric/Behavioral: Negative for agitation, behavioral problems, confusion and sleep disturbance. The patient is not nervous/anxious.        Sleeps 8 hrs     Immunization History  Administered Date(s) Administered  . Influenza, High Dose Seasonal PF 02/13/2018, 01/06/2019  . Influenza-Unspecified 12/31/2016  . PFIZER SARS-COV-2 Vaccination 04/29/2019, 05/20/2019  . Pneumococcal Conjugate-13 01/17/2017  . Pneumococcal Polysaccharide-23 04/09/2019   Pertinent  Health Maintenance Due  Topic Date Due  . MAMMOGRAM  08/16/2019  . INFLUENZA VACCINE  11/01/2019  . COLONOSCOPY  08/22/2022  . DEXA SCAN  Completed  . PNA vac Low Risk Adult  Completed   Fall Risk  11/27/2019 12/18/2018 09/25/2018 09/25/2018 07/10/2017  Falls in the past year? 0 0 0 0 No  Number falls in past yr: 0 0 0 0 -  Injury with Fall? 0 0 0 0 -    Vitals:   11/27/19 0839  BP: 110/70  Pulse: 88  Resp: 16  Temp: (!) 97.3 F (36.3 C)  SpO2: 97%  Weight: 97 lb 12.8 oz (44.4 kg)  Height: 5\' 5"  (1.651 m)   Body mass index is 16.27 kg/m. Physical Exam Vitals reviewed.  Constitutional:      General: She is not in acute distress.    Appearance: She is underweight. She is not ill-appearing.  HENT:     Head: Normocephalic.     Right Ear: Tympanic membrane, ear canal and external ear normal. There is no impacted cerumen.     Left Ear: Tympanic membrane, ear canal and external ear normal. There is no impacted cerumen.     Nose: Nose normal. No congestion or rhinorrhea.     Mouth/Throat:     Mouth: Mucous membranes are moist.     Pharynx: Oropharynx is clear. No oropharyngeal exudate or posterior oropharyngeal erythema.  Eyes:     General: No scleral icterus.       Right eye: No discharge.        Left eye: No discharge.     Extraocular Movements:  Extraocular movements intact.     Conjunctiva/sclera: Conjunctivae normal.     Pupils: Pupils are equal, round, and reactive to light.  Neck:     Vascular: No carotid bruit.  Cardiovascular:     Rate and Rhythm: Normal rate and regular rhythm.     Pulses: Normal pulses.     Heart sounds: Normal heart sounds. No murmur heard.  No friction rub. No gallop.   Pulmonary:     Effort: Pulmonary effort is normal. No respiratory distress.     Breath sounds: Normal breath sounds. No wheezing, rhonchi or rales.  Chest:     Chest wall: No tenderness.  Abdominal:     General: Bowel sounds are normal. There is no distension.     Palpations: Abdomen is soft. There is no mass.     Tenderness: There is no abdominal tenderness. There is no right CVA tenderness, left CVA tenderness, guarding or rebound.  Musculoskeletal:  General: No swelling or tenderness. Normal range of motion.     Cervical back: Normal range of motion. No rigidity or tenderness.     Right lower leg: No edema.     Left lower leg: No edema.  Lymphadenopathy:     Cervical: No cervical adenopathy.  Skin:    General: Skin is warm and dry.     Coloration: Skin is not pale.     Findings: Lesion present. No bruising, erythema or rash.     Comments: Right back skin lesion dark color with shade of gray with rough texture.   Neurological:     Mental Status: She is alert and oriented to person, place, and time.     Cranial Nerves: No cranial nerve deficit.     Sensory: No sensory deficit.     Motor: No weakness.     Coordination: Coordination normal.     Gait: Gait normal.  Psychiatric:        Mood and Affect: Mood normal.        Speech: Speech normal.        Behavior: Behavior normal.        Thought Content: Thought content normal.        Judgment: Judgment normal.    Labs reviewed: Recent Labs    12/04/18 0721  NA 139  K 4.2  CL 101  CO2 28  GLUCOSE 85  BUN 11  CREATININE 0.78  CALCIUM 9.7   Recent Labs     12/04/18 0721  AST 19  ALT 12  BILITOT 0.8  PROT 7.0    Lab Results  Component Value Date   TSH 3.83 12/04/2018   No results found for: HGBA1C Lab Results  Component Value Date   CHOL 247 (H) 12/04/2018   HDL 83 12/04/2018   LDLCALC 142 (H) 12/04/2018   TRIG 102 12/04/2018   CHOLHDL 3.0 12/04/2018    Significant Diagnostic Results in last 30 days:  No results found.  Assessment/Plan 1. Mild intermittent asthma, unspecified whether complicated Symptoms controlled. - Continue on Albuterol and Symbicort   2. Hyperlipidemia, unspecified hyperlipidemia type LDL on chart not at goal. Dietary and lifestyle modification advised. Orders in place for fasting labs  3. Seasonal allergies Continue on Allegra   4. Need for Tdap vaccination Advised to get Tdap vaccine at her pharmacy.   5. Breast cancer screening by mammogram Asymptomatic. - MM Digital Screening; Future  6. Skin lesion of back Right upper back skin lesion dark with shade of gray and rough texture.Will refer to dermatology for evaluation.  - Ambulatory referral to Dermatology  Family/ staff Communication: Reviewed plan of care with patient  Labs/tests ordered:  - MM Digital Screening; Future  Next Appointment : 6 months for medical management of chronic issues.Fasting labs in 2-4 days or sooner.   Sandrea Hughs, NP

## 2019-12-04 ENCOUNTER — Telehealth: Payer: Self-pay

## 2019-12-04 ENCOUNTER — Encounter: Payer: Self-pay | Admitting: Family

## 2019-12-04 ENCOUNTER — Other Ambulatory Visit: Payer: Self-pay

## 2019-12-04 ENCOUNTER — Ambulatory Visit (INDEPENDENT_AMBULATORY_CARE_PROVIDER_SITE_OTHER): Payer: Medicare Other | Admitting: Family

## 2019-12-04 DIAGNOSIS — Z780 Asymptomatic menopausal state: Secondary | ICD-10-CM | POA: Diagnosis not present

## 2019-12-04 DIAGNOSIS — Z0000 Encounter for general adult medical examination without abnormal findings: Secondary | ICD-10-CM | POA: Diagnosis not present

## 2019-12-04 NOTE — Telephone Encounter (Signed)
Ms. dulse, rutan are scheduled for a virtual visit with your provider today.    Just as we do with appointments in the office, we must obtain your consent to participate.  Your consent will be active for this visit and any virtual visit you may have with one of our providers in the next 365 days.    If you have a MyChart account, I can also send a copy of this consent to you electronically.  All virtual visits are billed to your insurance company just like a traditional visit in the office.  As this is a virtual visit, video technology does not allow for your provider to perform a traditional examination.  This may limit your provider's ability to fully assess your condition.  If your provider identifies any concerns that need to be evaluated in person or the need to arrange testing such as labs, EKG, etc, we will make arrangements to do so.    Although advances in technology are sophisticated, we cannot ensure that it will always work on either your end or our end.  If the connection with a video visit is poor, we may have to switch to a telephone visit.  With either a video or telephone visit, we are not always able to ensure that we have a secure connection.   I need to obtain your verbal consent now.   Are you willing to proceed with your visit today?   Hina Abeyta has provided verbal consent on 12/04/2019 for a virtual visit (video or telephone).   [place], [place] 12/04/2019  3:00 PM

## 2019-12-04 NOTE — Patient Instructions (Signed)
Rebecca [name] , Thank you for taking time to come for your Medicare Wellness Visit. I appreciate your ongoing commitment to your health goals. Please review the following plan we discussed and let me know if I can assist you in the future.   Screening recommendations/referrals: Colonoscopy Up to date  Mammogram: ordered 11/2019 please call if breast center does not call you for appointment Bone Density: Ordered today Imaging center  will call you for appointment  Recommended yearly ophthalmology/optometry visit for glaucoma screening and checkup Recommended yearly dental visit for hygiene and checkup  Vaccinations: Influenza vaccine: Annually  Pneumococcal vaccine : completed  Tdap vaccine: Up to dated  Shingles vaccine: Please get Vaccine at your pharmacy   Advanced directives: Yes   Conditions/risks identified: Advance age female > 32 yrs old   Next appointment: 1 year    Preventive Care 28 Years and Older, Female Preventive care refers to lifestyle choices and visits with your health care provider that can promote health and wellness. What does preventive care include?  A yearly physical exam. This is also called an annual well check.  Dental exams once or twice a year.  Routine eye exams. Ask your health care provider how often you should have your eyes checked.  Personal lifestyle choices, including:  Daily care of your teeth and gums.  Regular physical activity.  Eating a healthy diet.  Avoiding tobacco and drug use.  Limiting alcohol use.  Practicing safe sex.  Taking low-dose aspirin every [place].  Taking vitamin and mineral supplements as recommended by your health care provider. What happens during an annual well check? The services and screenings done by your health care provider during your annual well check will depend on your age, overall health, lifestyle risk factors, and family history of disease. Counseling  Your health care provider may ask you questions  about your:  Alcohol use.  Tobacco use.  Drug use.  Emotional well-being.  Home and relationship well-being.  Sexual activity.  Eating habits.  History of falls.  Memory and ability to understand (cognition).  Work and work Statistician.  Reproductive health. Screening  You may have the following tests or measurements:  Height, weight, and BMI.  Blood pressure.  Lipid and cholesterol levels. These may be checked every 5 years, or more frequently if you are over 46 years old.  Skin check.  Lung cancer screening. You may have this screening every year starting at age 65 if you have a 30-pack-year history of smoking and currently smoke or have quit within the past 15 years.  Fecal occult blood test (FOBT) of the stool. You may have this test every year starting at age 6.  Flexible sigmoidoscopy or colonoscopy. You may have a sigmoidoscopy every 5 years or a colonoscopy every 10 years starting at age 52.  Hepatitis C blood test.  Hepatitis B blood test.  Sexually transmitted disease (STD) testing.  Diabetes screening. This is done by checking your blood sugar (glucose) after you have not eaten for a while (fasting). You may have this done every 1-3 years.  Bone density scan. This is done to screen for osteoporosis. You may have this done starting at age 62.  Mammogram. This may be done every 1-2 years. Talk to your health care provider about how often you should have regular mammograms. Talk with your health care provider about your test results, treatment options, and if necessary, the need for more tests. Vaccines  Your health care provider may recommend certain vaccines,  such as:  Influenza vaccine. This is recommended every year.  Tetanus, diphtheria, and acellular pertussis (Tdap, Td) vaccine. You may need a Td booster every 10 years.  Zoster vaccine. You may need this after age 66.  Pneumococcal 13-valent conjugate (PCV13) vaccine. One dose is  recommended after age 28.  Pneumococcal polysaccharide (PPSV23) vaccine. One dose is recommended after age 72. Talk to your health care provider about which screenings and vaccines you need and how often you need them. This information is not intended to replace advice given to you by your health care provider. Make sure you discuss any questions you have with your health care provider. Document Released: 04/15/2015 Document Revised: 12/07/2015 Document Reviewed: 01/18/2015 Elsevier Interactive Patient Education  2017 [employer] Prevention in the Home Falls can cause injuries. They can happen to people of all ages. There are many things you can do to make your home safe and to help prevent falls. What can I do on the outside of my home?  Regularly fix the edges of walkways and driveways and fix any cracks.  Remove anything that might make you trip as you walk through a door, such as a raised step or threshold.  Trim any bushes or trees on the path to your home.  Use bright outdoor lighting.  Clear any walking paths of anything that might make someone trip, such as rocks or tools.  Regularly check to see if handrails are loose or broken. Make sure that both sides of any steps have handrails.  Any raised decks and porches should have guardrails on the edges.  Have any leaves, snow, or ice cleared regularly.  Use sand or salt on walking paths during winter.  Clean up any spills in your garage right away. This includes oil or grease spills. What can I do in the bathroom?  Use night lights.  Install grab bars by the toilet and in the tub and shower. Do not use towel bars as grab bars.  Use non-skid mats or decals in the tub or shower.  If you need to sit down in the shower, use a plastic, non-slip stool.  Keep the floor dry. Clean up any water that spills on the floor as soon as it happens.  Remove soap buildup in the tub or shower regularly.  Attach bath mats  securely with double-sided non-slip rug tape.  Do not have throw rugs and other things on the floor that can make you trip. What can I do in the bedroom?  Use night lights.  Make sure that you have a light by your bed that is easy to reach.  Do not use any sheets or blankets that are too big for your bed. They should not hang down onto the floor.  Have a firm chair that has side arms. You can use this for support while you get dressed.  Do not have throw rugs and other things on the floor that can make you trip. What can I do in the kitchen?  Clean up any spills right away.  Avoid walking on wet floors.  Keep items that you use a lot in easy-to-reach places.  If you need to reach something above you, use a strong step stool that has a grab bar.  Keep electrical cords out of the way.  Do not use floor polish or wax that makes floors slippery. If you must use wax, use non-skid floor wax.  Do not have throw rugs and other things on  the floor that can make you trip. What can I do with my stairs?  Do not leave any items on the stairs.  Make sure that there are handrails on both sides of the stairs and use them. Fix handrails that are broken or loose. Make sure that handrails are as long as the stairways.  Check any carpeting to make sure that it is firmly attached to the stairs. Fix any carpet that is loose or worn.  Avoid having throw rugs at the top or bottom of the stairs. If you do have throw rugs, attach them to the floor with carpet tape.  Make sure that you have a light switch at the top of the stairs and the bottom of the stairs. If you do not have them, ask someone to add them for you. What else can I do to help prevent falls?  Wear shoes that:  Do not have high heels.  Have rubber bottoms.  Are comfortable and fit you well.  Are closed at the toe. Do not wear sandals.  If you use a stepladder:  Make sure that it is fully opened. Do not climb a closed  stepladder.  Make sure that both sides of the stepladder are locked into place.  Ask someone to hold it for you, if possible.  Clearly mark and make sure that you can see:  Any grab bars or handrails.  First and last steps.  Where the edge of each step is.  Use tools that help you move around (mobility aids) if they are needed. These include:  Canes.  Walkers.  Scooters.  Crutches.  Turn on the lights when you go into a dark area. Replace any light bulbs as soon as they burn out.  Set up your furniture so you have a clear path. Avoid moving your furniture around.  If any of your floors are uneven, fix them.  If there are any pets around you, be aware of where they are.  Review your medicines with your doctor. Some medicines can make you feel dizzy. This can increase your chance of falling. Ask your doctor what other things that you can do to help prevent falls. This information is not intended to replace advice given to you by your health care provider. Make sure you discuss any questions you have with your health care provider. Document Released: 01/13/2009 Document Revised: 08/25/2015 Document Reviewed: 04/23/2014 Elsevier Interactive Patient Education  2017 [employer].

## 2019-12-04 NOTE — Progress Notes (Signed)
    This service is provided via telemedicine  No vital signs collected/recorded due to the encounter was a telemedicine visit.   Location of patient (ex: home, work): Home.  Patient consents to a telephone visit: Yes.  Location of the provider (ex: office, home):  [facility].  Name of any referring provider: N/A  Names of all persons participating in the telemedicine service and their role in the encounter:  Patient, Rebecca [name], RMA, Rebecca [name], Dinah, NP.    Time spent on call: 8 minutes spent on the phone with Medical Assistant.   

## 2019-12-04 NOTE — Progress Notes (Signed)
Subjective:   Rebecca [name] is a 69 y.o. female who presents for Medicare Annual (Subsequent) preventive examination.  Review of Systems     Cardiac Risk Factors include: advanced age (>52men, >34 women)     Objective:    There were no vitals filed for this visit. There is no height or weight on file to calculate BMI.  Advanced Directives 12/04/2019 11/27/2019 09/25/2018  Does Patient Have a Medical Advance Directive? Yes Yes Yes  Type of Paramedic of [place];Living will;Out of facility DNR (pink MOST or yellow form) Living will;Healthcare Power of [place];Out of facility DNR (pink MOST or yellow form) [place];Living will  Does patient want to make changes to medical advance directive? No - Patient declined No - Patient declined No - Patient declined  Copy of [place] in Chart? No - copy requested No - copy requested No - copy requested  Would patient like information on creating a medical advance directive? - No - Patient declined -    Current Medications (verified) Outpatient Encounter Medications as of 12/04/2019  Medication Sig   albuterol (VENTOLIN HFA) 108 (90 Base) MCG/ACT inhaler Inhale 1 puff into the lungs every 6 (six) hours as needed for wheezing or shortness of breath.   fexofenadine (ALLEGRA) 180 MG tablet Take 1 tablet (180 mg total) by mouth daily.   SYMBICORT 80-4.5 MCG/ACT inhaler TAKE 2 PUFFS BY MOUTH TWICE A [place]   No facility-administered encounter medications on file as of 12/04/2019.    Allergies (verified) Patient has no known allergies.   History: Past Medical History:  Diagnosis Date   Allergy    seasonal   Asthma    Osteoporosis    Other hyperlipidemia    Pneumonia    Shingles    Past Surgical History:  Procedure Laterality Date   WISDOM TOOTH EXTRACTION     Family History  Problem Relation Age of Onset   Cancer - Colon Mother 45   Osteoporosis Mother    Bipolar  disorder Mother    Mental illness Mother    Colon cancer Mother    Other Father 1       airplane accident   Anxiety disorder Daughter 42   Diabetes Mellitus II Paternal Grandfather    Esophageal cancer Neg Hx    Stomach cancer Neg Hx    Rectal cancer Neg Hx    Social History   Socioeconomic History   Marital status: Married    Spouse name: Not on file   Number of children: Not on file   Years of education: Not on file   Highest education level: Not on file  Occupational History   Not on file  Tobacco Use   Smoking status: Never Smoker   Smokeless tobacco: Never Used  Vaping Use   Vaping Use: Never used  Substance and Sexual Activity   Alcohol use: Yes    Alcohol/week: 7.0 standard drinks    Types: 7 Glasses of wine per week   Drug use: Never   Sexual activity: Not Currently  Other Topics Concern   Not on file  Social History Narrative   Social History      Diet? No beef or pork      Do you drink/eat things with caffeine? Tea      Marital status?                 married  What year were you married? 77-94, 98 - current      Do you live in a house, apartment, assisted living, condo, trailer, etc.? apartment      Is it one or more stories? 4th      How many persons live in your home? 2      Do you have any pets in your home? (please list) no      Highest level of education completed? MDIV      Current or past profession: Youth worker      Do you exercise?            some                           Type & how often? Walk, yoga 3 x wk      Advanced Directives      Do you have a living will? yes      Do you have a DNR form?          yes                        If not, do you want to discuss one?      Do you have signed POA/HPOA for forms? yes      Functional Status      Do you have difficulty bathing or dressing yourself? no      Do you have difficulty preparing food or eating? no      Do you have difficulty  managing your medications? no      Do you have difficulty managing your finances? No       Do you have difficulty affording your medications? No    Social Determinants of Radio broadcast assistant Strain:    Difficulty of Paying Living Expenses: Not on file  Food Insecurity:    Worried About [place] in the Last Year: Not on file   Ran Out of Food in the Last Year: Not on file  Transportation Needs:    Lack of Transportation (Medical): Not on file   Lack of Transportation (Non-Medical): Not on file  Physical Activity:    Days of Exercise per Week: Not on file   Minutes of Exercise per Session: Not on file  Stress:    Feeling of Stress : Not on file  Social Connections:    Frequency of Communication with Friends and Family: Not on file   Frequency of Social Gatherings with Friends and Family: Not on file   Attends Religious Services: Not on file   Active Member of Clubs or Organizations: Not on file   Attends Archivist Meetings: Not on file   Marital Status: Not on file    Tobacco Counseling Counseling given: Not Answered   Clinical Intake:  Pre-visit preparation completed: No  Pain : No/denies pain     BMI - recorded: 16.27 Nutritional Status: BMI <19  Underweight Nutritional Risks: None Diabetes: No  How often do you need to have someone help you when you read instructions, pamphlets, or other written materials from your doctor or pharmacy?: 1 - Never What is the last grade level you completed in school?: Masters Degree  Diabetic?no  Interpreter Needed?: No  Information entered by :: Caidynce Muzyka FNP-C   Activities of Daily Living In your present state of health, do you have any difficulty performing the following  activities: 12/04/2019  Hearing? N  Vision? N  Difficulty concentrating or making decisions? N  Walking or climbing stairs? N  Dressing or bathing? N  Doing errands, shopping? N  Preparing Food and eating  ? N  Using the Toilet? N  In the past six months, have you accidently leaked urine? N  Do you have problems with loss of bowel control? N  Managing your Medications? N  Housekeeping or managing your Housekeeping? N  Some recent data might be hidden    Patient Care Team: Lauree Chandler, NP as PCP - General (Geriatric Medicine)  Indicate any recent Medical Services you may have received from other than [facility] providers in the past year (date may be approximate).     Assessment:   This is a routine wellness examination for Rebecca [name].  Hearing/Vision screen  Hearing Screening   125Hz  250Hz  500Hz  1000Hz  2000Hz  3000Hz  4000Hz  6000Hz  8000Hz   Right ear:           Left ear:           Comments: No Hearing Concerns.  Vision Screening Comments: No Vision Concerns. Patient wears glasses.  Dietary issues and exercise activities discussed: Current Exercise Habits: Home exercise routine, Type of exercise: walking, Time (Minutes): 30, Frequency (Times/Week): 5, Weekly Exercise (Minutes/Week): 150, Intensity: Moderate, Exercise limited by: None identified  Goals     Gain weight     1. I want to gain some weight 110 lbs  2. Eat healthy diet to keep cholesterol        Depression Screen PHQ 2/9 Scores 12/04/2019 09/25/2018 09/25/2018 07/10/2017  PHQ - 2 Score 0 0 0 0    Fall Risk Fall Risk  12/04/2019 11/27/2019 12/18/2018 09/25/2018 09/25/2018  Falls in the past year? 0 0 0 0 0  Number falls in past yr: 0 0 0 0 0  Injury with Fall? 0 0 0 0 0    Any stairs in or around the home? Yes  If so, are there any without handrails? Yes  Home free of loose throw rugs in walkways, pet beds, electrical cords, etc? Yes  Adequate lighting in your home to reduce risk of falls? Yes   ASSISTIVE DEVICES UTILIZED TO PREVENT FALLS:  Life alert? No  Use of a cane, walker or w/c? No  Grab bars in the bathroom? No  Shower chair or bench in shower? No  Elevated toilet seat or a handicapped toilet? No   TIMED UP  AND GO:  Was the test performed? No .  Length of time to ambulate 10 feet: N/A sec.   Gait steady and fast without use of assistive device  Cognitive Function: MMSE - Mini Mental State Exam 09/25/2018  Orientation to time 4  Orientation to Place 5  Registration 3  Attention/ Calculation 5  Recall 3  Language- name 2 objects 2  Language- repeat 1  Language- follow 3 step command 3  Language- read & follow direction 1  Write a sentence 1  Copy design 1  Total score 29     6CIT Screen 12/04/2019  What Year? 0 points  What month? 0 points  What time? 0 points  Count back from 20 0 points  Months in reverse 0 points  Repeat phrase 0 points  Total Score 0    Immunizations Immunization History  Administered Date(s) Administered   Influenza, High Dose Seasonal PF 02/13/2018, 01/06/2019   Influenza-Unspecified 12/31/2016   PFIZER SARS-COV-2 Vaccination 04/29/2019, 05/20/2019   Pneumococcal Conjugate-13  01/17/2017   Pneumococcal Polysaccharide-23 04/09/2019    TDAP status: Up to date Flu Vaccine status: Up to date Pneumococcal vaccine status: Up to date Covid-19 vaccine status: Completed vaccines  Qualifies for Shingles Vaccine? Yes   Zostavax completed No   Shingrix Completed?: No.    Education has been provided regarding the importance of this vaccine. Patient has been advised to call insurance company to determine out of pocket expense if they have not yet received this vaccine. Advised may also receive vaccine at local pharmacy or Health Dept. Verbalized acceptance and understanding.  Screening Tests Health Maintenance  Topic Date Due   Hepatitis C Screening  Never done   MAMMOGRAM  08/16/2019   INFLUENZA VACCINE  11/01/2019   TETANUS/TDAP  04/08/2020 (Originally 07/20/1969)   COLONOSCOPY  08/22/2022   DEXA SCAN  Completed   COVID-19 Vaccine  Completed   PNA vac Low Risk Adult  Completed    Health Maintenance  Health Maintenance Due  Topic Date  Due   Hepatitis C Screening  Never done   MAMMOGRAM  08/16/2019   INFLUENZA VACCINE  11/01/2019    Colorectal cancer screening: Completed 08/21/2017. Repeat every 5 years Mammogram status: Ordered 11/2019. Pt provided with contact info and advised to call to schedule appt.  Bone Density status: Completed 08/15/2017. Results reflect: Bone density results: OSTEOPOROSIS. Repeat every 2 years.  Lung Cancer Screening: (Low Dose CT Chest recommended if Age 37-80 years, 30 pack-year currently smoking OR have quit w/in 15years.) does not qualify.   Lung Cancer Screening Referral: no  Additional Screening:  Hepatitis C Screening: does qualify; Completed: Has labs upcoming   Vision Screening: Recommended annual ophthalmology exams for early detection of glaucoma and other disorders of the eye. Is the patient up to date with their annual eye exam?  No  Who is the provider or what is the name of the office in which the patient attends annual eye exams? Goes to [facility]hthalm[facility] If pt is not established with a provider, would they like to be referred to a provider to establish care? No .   Dental Screening: Recommended annual dental exams for proper oral hygiene  Community Resource Referral / Chronic Care Management: CRR required this visit?  No   CCM required this visit?  No   Plan:    - mammogram ordered 11/2019 awaiting appointment  - Dexa scan ordered today  - Advised to get Shingrix vaccine at her pharmacy   I have personally reviewed and noted the following in the patients chart:    Medical and social history  Use of alcohol, tobacco or illicit drugs   Current medications and supplements  Functional ability and status  Nutritional status  Physical activity  Advanced directives  List of other physicians  Hospitalizations, surgeries, and ER visits in previous 12 months  Vitals  Screenings to include cognitive, depression, and falls  Referrals and  appointments  In addition, I have reviewed and discussed with patient certain preventive protocols, quality metrics, and best practice recommendations. A written personalized care plan for preventive services as well as general preventive health recommendations were provided to patient.  Sandrea Hughs, NP   12/04/2019   Nurse Notes: Advised to get Shingrix vaccine at her pharmacy.

## 2019-12-30 ENCOUNTER — Other Ambulatory Visit: Payer: Medicare Other

## 2019-12-30 DIAGNOSIS — Z20822 Contact with and (suspected) exposure to covid-19: Secondary | ICD-10-CM

## 2019-12-31 LAB — NOVEL CORONAVIRUS, NAA: SARS-CoV-2, NAA: NOT DETECTED

## 2019-12-31 LAB — SARS-COV-2, NAA 2 DAY TAT

## 2020-01-04 ENCOUNTER — Ambulatory Visit: Payer: Medicare Other | Attending: Critical Care Medicine

## 2020-01-04 DIAGNOSIS — Z23 Encounter for immunization: Secondary | ICD-10-CM

## 2020-01-04 NOTE — Progress Notes (Signed)
   Covid-19 Vaccination Clinic  Name:  Adelie Croswell    MRN: [id number] DOB: 11/08/1950  01/04/2020  Ms. Drakeford was observed post Covid-19 immunization for 15 minutes without incident. She was provided with Vaccine Information Sheet and instruction to access the V-Safe system.   Ms. Renfro was instructed to call 911 with any severe reactions post vaccine: Marland Kitchen Difficulty breathing  . Swelling of face and throat  . A fast heartbeat  . A bad rash all over body  . Dizziness and weakness

## 2020-03-14 ENCOUNTER — Emergency Department (HOSPITAL_COMMUNITY): Payer: Medicare Other

## 2020-03-14 ENCOUNTER — Inpatient Hospital Stay (HOSPITAL_COMMUNITY)
Admission: EM | Admit: 2020-03-14 | Discharge: 2020-03-19 | DRG: 492 | Disposition: A | Discharge status: Home Health | Payer: Medicare Other | Attending: Physician Assistant | Admitting: Physician Assistant

## 2020-03-14 ENCOUNTER — Inpatient Hospital Stay (HOSPITAL_COMMUNITY): Payer: Medicare Other

## 2020-03-14 DIAGNOSIS — S02119A Unspecified fracture of occiput, initial encounter for closed fracture: Secondary | ICD-10-CM | POA: Diagnosis present

## 2020-03-14 DIAGNOSIS — S065X0A Traumatic subdural hemorrhage without loss of consciousness, initial encounter: Secondary | ICD-10-CM | POA: Diagnosis present

## 2020-03-14 DIAGNOSIS — S065XAA Traumatic subdural hemorrhage with loss of consciousness status unknown, initial encounter: Secondary | ICD-10-CM

## 2020-03-14 DIAGNOSIS — S82141A Displaced bicondylar fracture of right tibia, initial encounter for closed fracture: Principal | ICD-10-CM | POA: Diagnosis present

## 2020-03-14 DIAGNOSIS — Z419 Encounter for procedure for purposes other than remedying health state, unspecified: Secondary | ICD-10-CM

## 2020-03-14 DIAGNOSIS — Z23 Encounter for immunization: Secondary | ICD-10-CM

## 2020-03-14 DIAGNOSIS — T148XXA Other injury of unspecified body region, initial encounter: Secondary | ICD-10-CM

## 2020-03-14 DIAGNOSIS — S0291XA Unspecified fracture of skull, initial encounter for closed fracture: Secondary | ICD-10-CM

## 2020-03-14 DIAGNOSIS — Z20822 Contact with and (suspected) exposure to covid-19: Secondary | ICD-10-CM | POA: Diagnosis present

## 2020-03-14 DIAGNOSIS — S065X9A Traumatic subdural hemorrhage with loss of consciousness of unspecified duration, initial encounter: Secondary | ICD-10-CM | POA: Diagnosis present

## 2020-03-14 DIAGNOSIS — S066X9A Traumatic subarachnoid hemorrhage with loss of consciousness of unspecified duration, initial encounter: Secondary | ICD-10-CM | POA: Diagnosis present

## 2020-03-14 DIAGNOSIS — D62 Acute posthemorrhagic anemia: Secondary | ICD-10-CM | POA: Diagnosis present

## 2020-03-14 DIAGNOSIS — S82101A Unspecified fracture of upper end of right tibia, initial encounter for closed fracture: Secondary | ICD-10-CM

## 2020-03-14 DIAGNOSIS — T1490XA Injury, unspecified, initial encounter: Secondary | ICD-10-CM | POA: Diagnosis present

## 2020-03-14 HISTORY — DX: Other seasonal allergic rhinitis: J30.2

## 2020-03-14 LAB — COMPREHENSIVE METABOLIC PANEL
ALT: 15 U/L (ref 0–44)
AST: 25 U/L (ref 15–41)
Albumin: 3.9 g/dL (ref 3.5–5.0)
Alkaline Phosphatase: 70 U/L (ref 38–126)
Anion gap: 13 (ref 5–15)
BUN: 16 mg/dL (ref 8–23)
CO2: 22 mmol/L (ref 22–32)
Calcium: 9 mg/dL (ref 8.9–10.3)
Chloride: 103 mmol/L (ref 98–111)
Creatinine, Ser: 0.71 mg/dL (ref 0.44–1.00)
GFR, Estimated: >60 mL/min (ref >60)
Glucose, Bld: 140 mg/dL — ABNORMAL HIGH (ref 70–99)
Potassium: 2.8 mmol/L — ABNORMAL LOW (ref 3.5–5.1)
Sodium: 138 mmol/L (ref 135–145)
Total Bilirubin: 0.7 mg/dL (ref 0.3–1.2)
Total Protein: 6.6 g/dL (ref 6.5–8.1)

## 2020-03-14 LAB — PROTIME-INR
INR: 0.9 (ref 0.8–1.2)
Prothrombin Time: 12.1 seconds (ref 11.4–15.2)

## 2020-03-14 LAB — URINALYSIS, ROUTINE W REFLEX MICROSCOPIC
Bilirubin Urine: NEGATIVE
Glucose, UA: NEGATIVE mg/dL
Hgb urine dipstick: NEGATIVE
Ketones, ur: NEGATIVE mg/dL
Leukocytes,Ua: NEGATIVE
Nitrite: NEGATIVE
Protein, ur: NEGATIVE mg/dL
Specific Gravity, Urine: 1.026 (ref 1.005–1.030)
pH: 5 (ref 5.0–8.0)

## 2020-03-14 LAB — I-STAT CHEM 8, ED
BUN: 19 mg/dL (ref 8–23)
Calcium, Ion: 1.1 mmol/L — ABNORMAL LOW (ref 1.15–1.40)
Chloride: 103 mmol/L (ref 98–111)
Creatinine, Ser: 0.6 mg/dL (ref 0.44–1.00)
Glucose, Bld: 134 mg/dL — ABNORMAL HIGH (ref 70–99)
HCT: 45 % (ref 36.0–46.0)
Hemoglobin: 15.3 g/dL — ABNORMAL HIGH (ref 12.0–15.0)
Potassium: 2.9 mmol/L — ABNORMAL LOW (ref 3.5–5.1)
Sodium: 139 mmol/L (ref 135–145)
TCO2: 23 mmol/L (ref 22–32)

## 2020-03-14 LAB — CBC
HCT: 44.9 % (ref 36.0–46.0)
Hemoglobin: 14.7 g/dL (ref 12.0–15.0)
MCH: 30.2 pg (ref 26.0–34.0)
MCHC: 32.7 g/dL (ref 30.0–36.0)
MCV: 92.2 fL (ref 80.0–100.0)
Platelets: 299 10*3/uL (ref 150–400)
RBC: 4.87 MIL/uL (ref 3.87–5.11)
RDW: 12.3 % (ref 11.5–15.5)
WBC: 12.3 10*3/uL — ABNORMAL HIGH (ref 4.0–10.5)
nRBC: 0 % (ref 0.0–0.2)

## 2020-03-14 LAB — ETHANOL: Alcohol, Ethyl (B): <10 mg/dL (ref <10)

## 2020-03-14 LAB — SAMPLE TO BLOOD BANK

## 2020-03-14 LAB — MRSA PCR SCREENING: MRSA by PCR: NEGATIVE

## 2020-03-14 LAB — RESP PANEL BY RT-PCR (FLU A&B, COVID) ARPGX2
Influenza A by PCR: NEGATIVE
Influenza B by PCR: NEGATIVE
SARS Coronavirus 2 by RT PCR: NEGATIVE

## 2020-03-14 LAB — LACTIC ACID, PLASMA: Lactic Acid, Venous: 2.3 mmol/L (ref 0.5–1.9)

## 2020-03-14 MED ORDER — LEVETIRACETAM IN NACL 500 MG/100ML IV SOLN
500.0000 mg | Freq: Two times a day (BID) | INTRAVENOUS | Status: DC
  Administered 2020-03-14 – 2020-03-19 (×10): 500 mg via INTRAVENOUS
  Filled 2020-03-14 (×11): qty 100

## 2020-03-14 MED ORDER — METHOCARBAMOL 1000 MG/10ML IJ SOLN
1000.0000 mg | Freq: Three times a day (TID) | INTRAVENOUS | Status: DC
  Administered 2020-03-14 – 2020-03-16 (×5): 1000 mg via INTRAVENOUS
  Filled 2020-03-14 (×10): qty 10

## 2020-03-14 MED ORDER — TETANUS-DIPHTH-ACELL PERTUSSIS 5-2.5-18.5 LF-MCG/0.5 IM SUSY
0.5000 mL | PREFILLED_SYRINGE | Freq: Once | INTRAMUSCULAR | Status: AC
  Administered 2020-03-14: 17:00:00 0.5 mL via INTRAMUSCULAR

## 2020-03-14 MED ORDER — SODIUM CHLORIDE 0.9 % IV SOLN: INTRAVENOUS | Status: DC

## 2020-03-14 MED ORDER — ONDANSETRON HCL 4 MG/2ML IJ SOLN
4.0000 mg | Freq: Once | INTRAMUSCULAR | Status: AC
  Administered 2020-03-14: 17:00:00 4 mg via INTRAVENOUS

## 2020-03-14 MED ORDER — DOCUSATE SODIUM 100 MG PO CAPS
100.0000 mg | ORAL_CAPSULE | Freq: Two times a day (BID) | ORAL | Status: DC
  Administered 2020-03-15 – 2020-03-18 (×5): 100 mg via ORAL
  Filled 2020-03-14 (×7): qty 1

## 2020-03-14 MED ORDER — PROMETHAZINE HCL 25 MG/ML IJ SOLN
12.5000 mg | Freq: Once | INTRAMUSCULAR | Status: AC
  Administered 2020-03-14: 19:00:00 12.5 mg via INTRAVENOUS
  Filled 2020-03-14: qty 1

## 2020-03-14 MED ORDER — IOHEXOL 300 MG/ML  SOLN
100.0000 mL | Freq: Once | INTRAMUSCULAR | Status: AC | PRN
  Administered 2020-03-14: 100 mL via INTRAVENOUS

## 2020-03-14 MED ORDER — ONDANSETRON HCL 4 MG/2ML IJ SOLN
4.0000 mg | Freq: Four times a day (QID) | INTRAMUSCULAR | Status: DC | PRN
  Administered 2020-03-15: 4 mg via INTRAVENOUS
  Filled 2020-03-14: qty 2

## 2020-03-14 MED ORDER — FENTANYL CITRATE (PF) 100 MCG/2ML IJ SOLN
25.0000 ug | Freq: Once | INTRAMUSCULAR | Status: AC
Start: 2020-03-14 — End: 2020-03-14
  Administered 2020-03-14: 17:00:00 25 ug via INTRAVENOUS
  Filled 2020-03-14: qty 2

## 2020-03-14 MED ORDER — SODIUM CHLORIDE 0.9 % IV BOLUS
1000.0000 mL | Freq: Once | INTRAVENOUS | Status: AC
  Administered 2020-03-14: 17:00:00 1000 mL via INTRAVENOUS

## 2020-03-14 MED ORDER — LIDOCAINE HCL (PF) 1 % IJ SOLN
5.0000 mL | Freq: Once | INTRAMUSCULAR | Status: AC
  Administered 2020-03-14: 17:00:00 5 mL
  Filled 2020-03-14: qty 5

## 2020-03-14 MED ORDER — ACETAMINOPHEN 10 MG/ML IV SOLN
1000.0000 mg | Freq: Four times a day (QID) | INTRAVENOUS | Status: AC
  Administered 2020-03-14 – 2020-03-15 (×4): 1000 mg via INTRAVENOUS
  Filled 2020-03-14 (×6): qty 100

## 2020-03-14 MED ORDER — ONDANSETRON HCL 4 MG/2ML IJ SOLN
4.0000 mg | Freq: Once | INTRAMUSCULAR | Status: AC
  Administered 2020-03-14: 19:00:00 4 mg via INTRAVENOUS
  Filled 2020-03-14: qty 2

## 2020-03-14 MED ORDER — MORPHINE SULFATE (PF) 4 MG/ML IV SOLN
4.0000 mg | INTRAVENOUS | Status: DC | PRN
  Administered 2020-03-14 – 2020-03-16 (×3): 4 mg via INTRAVENOUS
  Filled 2020-03-14 (×3): qty 1

## 2020-03-14 MED ORDER — FENTANYL CITRATE (PF) 100 MCG/2ML IJ SOLN
25.0000 ug | Freq: Once | INTRAMUSCULAR | Status: AC
  Administered 2020-03-14: 19:00:00 25 ug via INTRAVENOUS
  Filled 2020-03-14: qty 2

## 2020-03-14 MED ORDER — OXYCODONE HCL 5 MG PO TABS
5.0000 mg | ORAL_TABLET | ORAL | Status: DC | PRN
  Administered 2020-03-15 – 2020-03-18 (×7): 10 mg via ORAL
  Filled 2020-03-14 (×7): qty 2

## 2020-03-14 MED ORDER — ONDANSETRON 4 MG PO TBDP: 4.0000 mg | ORAL_TABLET | Freq: Four times a day (QID) | ORAL | Status: DC | PRN
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
## 2020-03-14 NOTE — Progress Notes (Signed)
Orthopedic Tech Progress Note Patient Details:  Rebecca [name] 25-Jun-1950 [id number]  Ortho Devices Type of Ortho Device: Knee Immobilizer Ortho Device/Splint Location: rle. I applied a bulky dressing under the knee immobilizer. Ortho Device/Splint Interventions: Ordered,Application,Adjustment   Post Interventions Patient Tolerated: Well Instructions Provided: Care of device,Adjustment of device   Karolee Stamps 03/14/2020, 11:29 PM
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
## 2020-03-14 NOTE — ED Notes (Signed)
Patient transported to CT 

## 2020-03-14 NOTE — Progress Notes (Signed)
Patient ID: Rebecca [name], female   DOB: May 17, 1950, 69 y.o.   MRN: [id number]  Consult received for right tibial plateau fracture Full note to follow  Directed ER to place in bulky knee wrap and knee immobilizer and to get CT scan of right knee for pre-op planning.  Will consult Ortho Trauma to assist with definitive treatment.
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
## 2020-03-15 ENCOUNTER — Inpatient Hospital Stay (HOSPITAL_COMMUNITY): Payer: Medicare Other

## 2020-03-15 LAB — BASIC METABOLIC PANEL
Anion gap: 8 (ref 5–15)
BUN: 10 mg/dL (ref 8–23)
CO2: 24 mmol/L (ref 22–32)
Calcium: 8.1 mg/dL — ABNORMAL LOW (ref 8.9–10.3)
Chloride: 105 mmol/L (ref 98–111)
Creatinine, Ser: 0.73 mg/dL (ref 0.44–1.00)
GFR, Estimated: >60 mL/min (ref >60)
Glucose, Bld: 137 mg/dL — ABNORMAL HIGH (ref 70–99)
Potassium: 4.3 mmol/L (ref 3.5–5.1)
Sodium: 137 mmol/L (ref 135–145)

## 2020-03-15 LAB — CBC
HCT: 34.1 % — ABNORMAL LOW (ref 36.0–46.0)
Hemoglobin: 11.8 g/dL — ABNORMAL LOW (ref 12.0–15.0)
MCH: 31.4 pg (ref 26.0–34.0)
MCHC: 34.6 g/dL (ref 30.0–36.0)
MCV: 90.7 fL (ref 80.0–100.0)
Platelets: 230 10*3/uL (ref 150–400)
RBC: 3.76 MIL/uL — ABNORMAL LOW (ref 3.87–5.11)
RDW: 12.4 % (ref 11.5–15.5)
WBC: 15.2 10*3/uL — ABNORMAL HIGH (ref 4.0–10.5)
nRBC: 0 % (ref 0.0–0.2)

## 2020-03-15 LAB — HIV ANTIBODY (ROUTINE TESTING W REFLEX): HIV Screen 4th Generation wRfx: NONREACTIVE

## 2020-03-15 MED ORDER — CHLORHEXIDINE GLUCONATE 4 % EX LIQD
60.0000 mL | Freq: Once | CUTANEOUS | Status: AC
  Administered 2020-03-16: 4 via TOPICAL
  Filled 2020-03-15: qty 60

## 2020-03-15 MED ORDER — PNEUMOCOCCAL VAC POLYVALENT 25 MCG/0.5ML IJ INJ
0.5000 mL | INJECTION | INTRAMUSCULAR | Status: DC
  Filled 2020-03-15 (×2): qty 0.5

## 2020-03-15 MED ORDER — POVIDONE-IODINE 10 % EX SWAB
2.0000 "application " | Freq: Once | CUTANEOUS | Status: AC
  Administered 2020-03-16: 2 via TOPICAL

## 2020-03-15 MED ORDER — ENSURE PRE-SURGERY PO LIQD
296.0000 mL | Freq: Once | ORAL | Status: AC
  Administered 2020-03-15: 23:00:00 296 mL via ORAL
  Filled 2020-03-15: qty 296

## 2020-03-15 MED ORDER — CHLORHEXIDINE GLUCONATE CLOTH 2 % EX PADS
6.0000 | MEDICATED_PAD | Freq: Every day | CUTANEOUS | Status: DC
  Administered 2020-03-18: 10:00:00 6 via TOPICAL

## 2020-03-15 MED ORDER — CEFAZOLIN SODIUM-DEXTROSE 2-4 GM/100ML-% IV SOLN
2.0000 g | INTRAVENOUS | Status: AC
  Administered 2020-03-16: 12:00:00 2 g via INTRAVENOUS
  Filled 2020-03-15 (×2): qty 100

## 2020-03-15 MED ORDER — BUTALBITAL-APAP-CAFFEINE 50-325-40 MG PO TABS
2.0000 | ORAL_TABLET | Freq: Three times a day (TID) | ORAL | Status: DC | PRN
  Administered 2020-03-16: 2 via ORAL
  Filled 2020-03-15 (×2): qty 2
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
## 2020-03-15 NOTE — Progress Notes (Signed)
Pts belongings sent home with family. Only belongings in pt room are one earring and a undershirt. Two rings on pt's hand and will continue to be worn.
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
## 2020-03-16 ENCOUNTER — Inpatient Hospital Stay (HOSPITAL_COMMUNITY): Payer: Medicare Other

## 2020-03-16 ENCOUNTER — Inpatient Hospital Stay (HOSPITAL_COMMUNITY): Payer: Medicare Other | Admitting: Registered Nurse

## 2020-03-16 ENCOUNTER — Encounter (HOSPITAL_COMMUNITY): Admission: EM | Disposition: A | Discharge status: Home / Self Care | Payer: Self-pay | Source: Home / Self Care

## 2020-03-16 ENCOUNTER — Encounter (HOSPITAL_COMMUNITY): Payer: Self-pay

## 2020-03-16 HISTORY — PX: ORIF TIBIA PLATEAU: SHX2132

## 2020-03-16 LAB — BASIC METABOLIC PANEL
Anion gap: 5 (ref 5–15)
BUN: 6 mg/dL — ABNORMAL LOW (ref 8–23)
CO2: 25 mmol/L (ref 22–32)
Calcium: 7.9 mg/dL — ABNORMAL LOW (ref 8.9–10.3)
Chloride: 104 mmol/L (ref 98–111)
Creatinine, Ser: 0.69 mg/dL (ref 0.44–1.00)
GFR, Estimated: >60 mL/min (ref >60)
Glucose, Bld: 152 mg/dL — ABNORMAL HIGH (ref 70–99)
Potassium: 4 mmol/L (ref 3.5–5.1)
Sodium: 134 mmol/L — ABNORMAL LOW (ref 135–145)

## 2020-03-16 LAB — CBC
HCT: 31.1 % — ABNORMAL LOW (ref 36.0–46.0)
Hemoglobin: 10.1 g/dL — ABNORMAL LOW (ref 12.0–15.0)
MCH: 30.2 pg (ref 26.0–34.0)
MCHC: 32.5 g/dL (ref 30.0–36.0)
MCV: 93.1 fL (ref 80.0–100.0)
Platelets: 183 10*3/uL (ref 150–400)
RBC: 3.34 MIL/uL — ABNORMAL LOW (ref 3.87–5.11)
RDW: 12.9 % (ref 11.5–15.5)
WBC: 9.6 10*3/uL (ref 4.0–10.5)
nRBC: 0 % (ref 0.0–0.2)

## 2020-03-16 LAB — SURGICAL PCR SCREEN
MRSA, PCR: NEGATIVE
Staphylococcus aureus: POSITIVE — AB

## 2020-03-16 SURGERY — OPEN REDUCTION INTERNAL FIXATION (ORIF) TIBIAL PLATEAU
Anesthesia: General | Laterality: Right

## 2020-03-16 MED ORDER — MIDAZOLAM HCL 2 MG/2ML IJ SOLN
INTRAMUSCULAR | Status: AC
  Filled 2020-03-16: qty 2

## 2020-03-16 MED ORDER — HYDROMORPHONE HCL 1 MG/ML IJ SOLN: 0.2500 mg | INTRAMUSCULAR | Status: DC | PRN

## 2020-03-16 MED ORDER — MUPIROCIN 2 % EX OINT
1.0000 "application " | TOPICAL_OINTMENT | Freq: Two times a day (BID) | CUTANEOUS | Status: DC
  Administered 2020-03-16 – 2020-03-19 (×7): 1 via NASAL
  Filled 2020-03-16 (×3): qty 22

## 2020-03-16 MED ORDER — ONDANSETRON HCL 4 MG/2ML IJ SOLN
INTRAMUSCULAR | Status: DC | PRN
  Administered 2020-03-16: 4 mg via INTRAVENOUS

## 2020-03-16 MED ORDER — LIDOCAINE 2% (20 MG/ML) 5 ML SYRINGE
INTRAMUSCULAR | Status: AC
  Filled 2020-03-16: qty 5

## 2020-03-16 MED ORDER — OXYCODONE HCL 5 MG PO TABS: 5.0000 mg | ORAL_TABLET | Freq: Once | ORAL | Status: DC | PRN

## 2020-03-16 MED ORDER — TOBRAMYCIN SULFATE 1.2 G IJ SOLR
INTRAMUSCULAR | Status: AC
  Filled 2020-03-16: qty 1.2

## 2020-03-16 MED ORDER — ONDANSETRON HCL 4 MG/2ML IJ SOLN
INTRAMUSCULAR | Status: AC
  Filled 2020-03-16: qty 2

## 2020-03-16 MED ORDER — VANCOMYCIN HCL 1000 MG IV SOLR
INTRAVENOUS | Status: AC
  Filled 2020-03-16: qty 1000

## 2020-03-16 MED ORDER — HYDROMORPHONE HCL 1 MG/ML IJ SOLN
INTRAMUSCULAR | Status: AC
  Administered 2020-03-16: 15:00:00 0.5 mg via INTRAVENOUS
  Filled 2020-03-16: qty 1

## 2020-03-16 MED ORDER — OXYCODONE HCL 5 MG/5ML PO SOLN: 5.0000 mg | Freq: Once | ORAL | Status: DC | PRN

## 2020-03-16 MED ORDER — AMISULPRIDE (ANTIEMETIC) 5 MG/2ML IV SOLN: 10.0000 mg | Freq: Once | INTRAVENOUS | Status: DC | PRN

## 2020-03-16 MED ORDER — ENOXAPARIN SODIUM 30 MG/0.3ML ~~LOC~~ SOLN
30.0000 mg | Freq: Two times a day (BID) | SUBCUTANEOUS | Status: DC
  Administered 2020-03-17: 09:00:00 30 mg via SUBCUTANEOUS
  Filled 2020-03-16: qty 0.3

## 2020-03-16 MED ORDER — LIDOCAINE 2% (20 MG/ML) 5 ML SYRINGE
INTRAMUSCULAR | Status: DC | PRN
  Administered 2020-03-16: 40 mg via INTRAVENOUS

## 2020-03-16 MED ORDER — FENTANYL CITRATE (PF) 250 MCG/5ML IJ SOLN
INTRAMUSCULAR | Status: DC | PRN
  Administered 2020-03-16: 50 ug via INTRAVENOUS
  Administered 2020-03-16: 100 ug via INTRAVENOUS

## 2020-03-16 MED ORDER — METHOCARBAMOL 500 MG PO TABS
1000.0000 mg | ORAL_TABLET | Freq: Three times a day (TID) | ORAL | Status: DC
  Administered 2020-03-16 – 2020-03-19 (×9): 1000 mg via ORAL
  Filled 2020-03-16 (×9): qty 2

## 2020-03-16 MED ORDER — ACETAMINOPHEN 325 MG PO TABS
650.0000 mg | ORAL_TABLET | Freq: Four times a day (QID) | ORAL | Status: DC
  Administered 2020-03-16 – 2020-03-19 (×11): 650 mg via ORAL
  Filled 2020-03-16 (×11): qty 2

## 2020-03-16 MED ORDER — PROPOFOL 10 MG/ML IV BOLUS
INTRAVENOUS | Status: AC
  Filled 2020-03-16: qty 20

## 2020-03-16 MED ORDER — FENTANYL CITRATE (PF) 250 MCG/5ML IJ SOLN
INTRAMUSCULAR | Status: AC
  Filled 2020-03-16: qty 5

## 2020-03-16 MED ORDER — CHLORHEXIDINE GLUCONATE 0.12 % MT SOLN
OROMUCOSAL | Status: AC
  Administered 2020-03-16: 12:00:00 15 mL via OROMUCOSAL
  Filled 2020-03-16: qty 15

## 2020-03-16 MED ORDER — ONDANSETRON HCL 4 MG/2ML IJ SOLN: 4.0000 mg | Freq: Once | INTRAMUSCULAR | Status: DC | PRN

## 2020-03-16 MED ORDER — ENOXAPARIN SODIUM 30 MG/0.3ML ~~LOC~~ SOLN: 30.0000 mg | Freq: Two times a day (BID) | SUBCUTANEOUS | Status: DC

## 2020-03-16 MED ORDER — CEFAZOLIN SODIUM-DEXTROSE 2-4 GM/100ML-% IV SOLN
2.0000 g | Freq: Three times a day (TID) | INTRAVENOUS | Status: AC
  Administered 2020-03-16 – 2020-03-17 (×3): 2 g via INTRAVENOUS
  Filled 2020-03-16 (×4): qty 100

## 2020-03-16 MED ORDER — PROPOFOL 10 MG/ML IV BOLUS
INTRAVENOUS | Status: DC | PRN
  Administered 2020-03-16: 140 mg via INTRAVENOUS

## 2020-03-16 MED ORDER — VANCOMYCIN HCL 1000 MG IV SOLR
INTRAVENOUS | Status: DC | PRN
  Administered 2020-03-16: 1000 mg via TOPICAL

## 2020-03-16 MED ORDER — 0.9 % SODIUM CHLORIDE (POUR BTL) OPTIME
TOPICAL | Status: DC | PRN
  Administered 2020-03-16: 13:00:00 1000 mL

## 2020-03-16 MED ORDER — LACTATED RINGERS IV SOLN: INTRAVENOUS | Status: DC

## 2020-03-16 MED ORDER — CHLORHEXIDINE GLUCONATE 0.12 % MT SOLN: 15.0000 mL | Freq: Once | OROMUCOSAL | Status: AC

## 2020-03-16 MED ORDER — DEXAMETHASONE SODIUM PHOSPHATE 10 MG/ML IJ SOLN
INTRAMUSCULAR | Status: DC | PRN
  Administered 2020-03-16: 5 mg via INTRAVENOUS

## 2020-03-16 MED ORDER — SUGAMMADEX SODIUM 200 MG/2ML IV SOLN
INTRAVENOUS | Status: DC | PRN
  Administered 2020-03-16 (×2): 100 mg via INTRAVENOUS

## 2020-03-16 MED ORDER — DEXAMETHASONE SODIUM PHOSPHATE 10 MG/ML IJ SOLN
INTRAMUSCULAR | Status: AC
  Filled 2020-03-16: qty 1

## 2020-03-16 MED ORDER — ROCURONIUM BROMIDE 10 MG/ML (PF) SYRINGE
PREFILLED_SYRINGE | INTRAVENOUS | Status: AC
  Filled 2020-03-16: qty 10

## 2020-03-16 MED ORDER — HYDROMORPHONE HCL 1 MG/ML IJ SOLN
0.2500 mg | INTRAMUSCULAR | Status: DC | PRN
  Administered 2020-03-16: 0.5 mg via INTRAVENOUS

## 2020-03-16 MED ORDER — ROCURONIUM BROMIDE 10 MG/ML (PF) SYRINGE
PREFILLED_SYRINGE | INTRAVENOUS | Status: DC | PRN
  Administered 2020-03-16: 60 mg via INTRAVENOUS

## 2020-03-16 MED ORDER — MIDAZOLAM HCL 5 MG/5ML IJ SOLN
INTRAMUSCULAR | Status: DC | PRN
  Administered 2020-03-16: 2 mg via INTRAVENOUS

## 2020-03-16 MED ORDER — ACETAMINOPHEN 500 MG PO TABS: 500.0000 mg | ORAL_TABLET | Freq: Four times a day (QID) | ORAL | Status: DC | PRN

## 2020-03-16 SURGICAL SUPPLY — 73 items
BANDAGE ESMARK 6X9 LF (GAUZE/BANDAGES/DRESSINGS) ×1 IMPLANT
BENZOIN TINCTURE PRP APPL 2/3 (GAUZE/BANDAGES/DRESSINGS) ×2 IMPLANT
BIT DRILL QC 2.5MM SHRT EVO SM (DRILL) ×1 IMPLANT
BLADE SURG 15 STRL LF DISP TIS (BLADE) ×1 IMPLANT
BLADE SURG 15 STRL SS (BLADE) ×1
BNDG ELASTIC 4X5.8 VLCR STR LF (GAUZE/BANDAGES/DRESSINGS) ×2 IMPLANT
BNDG ELASTIC 6X10 VLCR STRL LF (GAUZE/BANDAGES/DRESSINGS) ×2 IMPLANT
BNDG ELASTIC 6X5.8 VLCR STR LF (GAUZE/BANDAGES/DRESSINGS) ×2 IMPLANT
BNDG ESMARK 6X9 LF (GAUZE/BANDAGES/DRESSINGS) ×2
BNDG GAUZE ELAST 4 BULKY (GAUZE/BANDAGES/DRESSINGS) ×2 IMPLANT
BRUSH SCRUB EZ PLAIN DRY (MISCELLANEOUS) ×4 IMPLANT
CANISTER SUCT 3000ML PPV (MISCELLANEOUS) ×2 IMPLANT
CHLORAPREP W/TINT 26 (MISCELLANEOUS) ×4 IMPLANT
CLSR STERI-STRIP ANTIMIC 1/2X4 (GAUZE/BANDAGES/DRESSINGS) ×2 IMPLANT
COVER SURGICAL LIGHT HANDLE (MISCELLANEOUS) ×2 IMPLANT
COVER WAND RF STERILE (DRAPES) ×2 IMPLANT
CUFF TOURN SGL QUICK 34 (TOURNIQUET CUFF) ×1
CUFF TRNQT CYL 34X4.125X (TOURNIQUET CUFF) ×1 IMPLANT
DRAPE C-ARM 42X72 X-RAY (DRAPES) ×2 IMPLANT
DRAPE C-ARMOR (DRAPES) ×2 IMPLANT
DRAPE ORTHO SPLIT 77X108 STRL (DRAPES) ×2
DRAPE SURG ORHT 6 SPLT 77X108 (DRAPES) ×2 IMPLANT
DRAPE U-SHAPE 47X51 STRL (DRAPES) ×2 IMPLANT
DRILL QC 2.5MM SHORT EVOS SM (DRILL) ×2
DRSG PAD ABDOMINAL 8X10 ST (GAUZE/BANDAGES/DRESSINGS) ×4 IMPLANT
ELECT REM PT RETURN 9FT ADLT (ELECTROSURGICAL) ×2
ELECTRODE REM PT RTRN 9FT ADLT (ELECTROSURGICAL) ×1 IMPLANT
GAUZE SPONGE 4X4 12PLY STRL (GAUZE/BANDAGES/DRESSINGS) ×2 IMPLANT
GLOVE BIO SURGEON STRL SZ 6.5 (GLOVE) ×6 IMPLANT
GLOVE BIO SURGEON STRL SZ7.5 (GLOVE) ×8 IMPLANT
GLOVE BIOGEL PI IND STRL 6.5 (GLOVE) ×1 IMPLANT
GLOVE BIOGEL PI IND STRL 7.5 (GLOVE) ×1 IMPLANT
GLOVE BIOGEL PI INDICATOR 6.5 (GLOVE) ×1
GLOVE BIOGEL PI INDICATOR 7.5 (GLOVE) ×1
GOWN STRL REUS W/ TWL LRG LVL3 (GOWN DISPOSABLE) ×2 IMPLANT
GOWN STRL REUS W/TWL LRG LVL3 (GOWN DISPOSABLE) ×2
IMMOBILIZER KNEE 22 UNIV (SOFTGOODS) ×2 IMPLANT
K-WIRE 1.6 (WIRE) ×1
K-WIRE FX150X1.6XTROC PNT (WIRE) ×1
KIT BASIN OR (CUSTOM PROCEDURE TRAY) ×2 IMPLANT
KIT TURNOVER KIT B (KITS) ×2 IMPLANT
KWIRE FX150X1.6XTROC PNT (WIRE) ×1 IMPLANT
NDL SUT 6 .5 CRC .975X.05 MAYO (NEEDLE) ×1 IMPLANT
NEEDLE MAYO TAPER (NEEDLE) ×1
NS IRRIG 1000ML POUR BTL (IV SOLUTION) ×2 IMPLANT
PACK TOTAL JOINT (CUSTOM PROCEDURE TRAY) ×2 IMPLANT
PAD ABD 8X10 STRL (GAUZE/BANDAGES/DRESSINGS) ×2 IMPLANT
PAD ARMBOARD 7.5X6 YLW CONV (MISCELLANEOUS) ×4 IMPLANT
PAD CAST 4YDX4 CTTN HI CHSV (CAST SUPPLIES) ×1 IMPLANT
PADDING CAST COTTON 4X4 STRL (CAST SUPPLIES) ×1
PADDING CAST COTTON 6X4 STRL (CAST SUPPLIES) ×2 IMPLANT
PLATE TIB EVOS 10H R 3.5X138 (Plate) ×2 IMPLANT
SCREW CORT 3.5X22 ST EVOS (Screw) ×2 IMPLANT
SCREW CORT 3.5X32 ST EVOS (Screw) ×2 IMPLANT
SCREW CORT ST EVOS 3.5X65 (Screw) ×2 IMPLANT
SCREW LOCK 3.5X50MM EVOS (Screw) ×2 IMPLANT
SCREW LOCK 3.5X60MM EVOS (Screw) ×6 IMPLANT
SCREW LOCK ST 3.5X48 (Screw) ×2 IMPLANT
SCREW LOCK ST EVOS 3.5 X 26 (Screw) ×2 IMPLANT
SCREW LOCK ST EVOS 3.5X22 (Screw) ×2 IMPLANT
SPONGE DRAIN TRACH 4X4 STRL 2S (GAUZE/BANDAGES/DRESSINGS) ×2 IMPLANT
STAPLER VISISTAT 35W (STAPLE) ×2 IMPLANT
SUCTION FRAZIER HANDLE 10FR (MISCELLANEOUS) ×1
SUCTION TUBE FRAZIER 10FR DISP (MISCELLANEOUS) ×1 IMPLANT
SUT MNCRL AB 3-0 PS2 27 (SUTURE) ×2 IMPLANT
SUT VIC AB 0 CT1 27 (SUTURE) ×1
SUT VIC AB 0 CT1 27XBRD ANBCTR (SUTURE) ×1 IMPLANT
SUT VIC AB 1 CT1 27 (SUTURE) ×1
SUT VIC AB 1 CT1 27XBRD ANBCTR (SUTURE) ×1 IMPLANT
SUT VIC AB 2-0 CT1 27 (SUTURE) ×1
SUT VIC AB 2-0 CT1 TAPERPNT 27 (SUTURE) ×1 IMPLANT
TOWEL GREEN STERILE (TOWEL DISPOSABLE) ×4 IMPLANT
WATER STERILE IRR 1000ML POUR (IV SOLUTION) ×4 IMPLANT

## 2020-03-16 NOTE — Discharge Instructions (Addendum)
Subdural Hematoma  A subdural hematoma is a collection of blood between the brain and its outer covering (dura). As the amount of blood increases, pressure builds on the brain. There are two types of subdural hematomas:  Acute. This type develops shortly after a hard, direct hit to the head and causes blood to collect very quickly. This is a medical emergency. If it is not diagnosed and treated quickly, it can lead to severe brain injury or death.  Chronic. This is when bleeding develops more slowly, over weeks or months. In some cases, this type does not cause symptoms. What are the causes? This condition is caused by bleeding (hemorrhage) from a broken (ruptured) blood vessel. In most cases, a blood vessel ruptures and bleeds because of a head injury, such as from a hard, direct hit. Head injuries can happen in car accidents, falls, assaults, or while playing sports. In rare cases, a hemorrhage can happen without a known cause (spontaneously), especially if you take blood thinners (anticoagulants). What increases the risk? This condition is more likely to develop in:  Older people.  Infants.  People who take blood thinners.  People who have head injuries.  People who abuse alcohol. What are the signs or symptoms? Symptoms of this condition can vary depending on the size of the hematoma. Symptoms can be mild, severe, or life-threatening. They include:  Headaches.  Nausea or vomiting.  Changes in vision, such as double vision or loss of vision.  Changes in speech or trouble understanding what people say.  Loss of balance or trouble walking.  Weakness, numbness, or tingling in the arms or legs, especially on one side of the body.  Seizures.  Change in personality.  Increased sleepiness.  Memory loss.  Loss of consciousness.  Coma. Symptoms of acute subdural hematoma can develop over minutes or hours. Symptoms of chronic subdural hematoma may develop over weeks or  months. How is this diagnosed? This condition is diagnosed based on the results of:  A physical exam.  Tests of strength, reflexes, coordination, senses, manner of walking (gait), and facial and eye movements (neurological exam).  Imaging tests, such as an MRI or a CT scan. How is this treated? Treatment for this condition depends on the type of hematoma and how severe it is. Treatment for acute hematoma may include:  Emergency surgery to drain blood or remove a blood clot.  Medicines that help the body get rid of excess fluids (diuretics). These may help to reduce pressure in the brain.  Assisted breathing (ventilation). Treatment for chronic hematoma may include:  Observation and bed rest at the hospital.  Surgery. If you take blood thinners, you may need to stop taking them for a short time. You may also be given anti-seizure (anticonvulsant) medicine. Sometimes, no treatment is needed for chronic subdural hematoma. Follow these instructions at home: Activity  Avoid situations where you could injure your head again, such as in competitive sports, downhill snow sports, and horseback riding. Do not do these activities until your health care provider approves. ? Wear protective gear, such as a helmet, when participating in activities such as biking or contact sports.  Avoid too much visual stimulation while recovering. This means limiting how much you read and limiting your screen time on a smart phone, tablet, computer, or TV.  Rest as told by your health care provider. Rest helps the brain heal.  Try to avoid activities that cause physical or mental stress. Return to work or school as told by  your health care provider.  Do not lift anything that is heavier than 5 lb (2.3 kg), or the limit you are told, until your health care provider says that it is safe.  Do not drive, ride a bike, or use heavy machinery until your health care provider approves.  Always wear your seat belt  when you are in a motor vehicle. Alcohol use  Do not drink alcohol if your health care provider tells you not to drink.  If you drink alcohol, limit how much you use to: ? 0-1 drink a [place] for women. ? 0-2 drinks a Lui for men. General instructions  Monitor your symptoms, and ask people around you to do the same. Recovery from brain injuries varies. Talk with your health care provider about what to expect.  Take over-the-counter and prescription medicines only as told by your health care provider. Do not take blood thinners or NSAIDs unless your health care provider approves. These include aspirin, ibuprofen, naproxen, and warfarin.  Keep your home environment safe to reduce the risk of falling.  Keep all follow-up visits as told by your health care provider. This is important. Where to find more information  [employer] of Neurological Disorders and Stroke: MasterBoxes.it  American Academy of Neurology (AAN): http://keith.biz/  Brain Injury Association of [place]: www.biausa.org Get help right away if you:  Are taking blood thinners and you fall or you experience minor trauma to the head. If you take any blood thinners, even a very small injury can cause a subdural hematoma.  Have a bleeding disorder and you fall or you experience minor trauma to the head.  Develop any of the following symptoms after a head injury: ? Clear fluid draining from your nose or ears. ? Nausea or vomiting. ? Changes in speech or trouble understanding what people say. ? Seizures. ? Drowsiness or a decrease in alertness. ? Double vision. ? Numbness or inability to move (paralysis) in any part of your body. ? Difficulty walking or poor coordination. ? Difficulty thinking. ? Confusion or forgetfulness. ? Personality changes. ? Irrational or aggressive behavior. These symptoms may represent a serious problem that is an emergency. Do not wait to see if the symptoms will go away. Get medical help  right away. Call your local emergency services (911 in the [place] Do not drive yourself to the hospital. Summary  A subdural hematoma is a collection of blood between the brain and its outer covering (dura).  Treatment for this condition depends on what type of subdural hematoma you have and how severe it is.  Symptoms can vary from mild to severe to life-threatening.  Monitor your symptoms, and ask others around you to do the same. This information is not intended to replace advice given to you by your health care provider. Make sure you discuss any questions you have with your health care provider. Document Revised: 02/17/2018 Document Reviewed: 02/17/2018 Elsevier Patient Education  2020 [facility] Discharge Instructions   General Discharge Instructions  WEIGHT BEARING STATUS: Non-weightbearing right lower extremity  RANGE OF MOTION/ACTIVITY: Ok for knee range of motion as tolerated during the [place]. Wear knee immobilizer at night when sleeping for first 2 weeks until seen for follow-up appointment  Wound Care: Incisions can be left open to air if there is no drainage. If incision continues to have drainage, follow wound care instructions below. Okay to shower if no drainage from incisions.  DVT/PE prophylaxis: Aspirin 325 mg twice daily  x 30 days  Diet: as you were eating previously.  Can use over the counter stool softeners and bowel preparations, such as Miralax, to help with bowel movements.  Narcotics can be constipating.  Be sure to drink plenty of fluids  PAIN MEDICATION USE AND EXPECTATIONS  You have likely been given narcotic medications to help control your pain.  After a traumatic event that results in an fracture (broken bone) with or without surgery, it is ok to use narcotic pain medications to help control one's pain.  We understand that everyone responds to pain differently and each individual patient will be evaluated on a regular  basis for the continued need for narcotic medications. Ideally, narcotic medication use should last no more than 6-8 weeks (coinciding with fracture healing).   As a patient it is your responsibility as well to monitor narcotic medication use and report the amount and frequency you use these medications when you come to your office visit.   We would also advise that if you are using narcotic medications, you should take a dose prior to therapy to maximize you participation.  IF YOU ARE ON NARCOTIC MEDICATIONS IT IS NOT PERMISSIBLE TO OPERATE A MOTOR VEHICLE (MOTORCYCLE/CAR/TRUCK/MOPED) OR HEAVY MACHINERY DO NOT MIX NARCOTICS WITH OTHER CNS (CENTRAL NERVOUS SYSTEM) DEPRESSANTS SUCH AS ALCOHOL   STOP SMOKING OR USING NICOTINE PRODUCTS!!!!  As discussed nicotine severely impairs your body's ability to heal surgical and traumatic wounds but also impairs bone healing.  Wounds and bone heal by forming microscopic blood vessels (angiogenesis) and nicotine is a vasoconstrictor (essentially, shrinks blood vessels).  Therefore, if vasoconstriction occurs to these microscopic blood vessels they essentially disappear and are unable to deliver necessary nutrients to the healing tissue.  This is one modifiable factor that you can do to dramatically increase your chances of healing your injury.    (This means no smoking, no nicotine gum, patches, etc)  DO NOT USE NONSTEROIDAL ANTI-INFLAMMATORY DRUGS (NSAID'S)  Using products such as Advil (ibuprofen), Aleve (naproxen), Motrin (ibuprofen) for additional pain control during fracture healing can delay and/or prevent the healing response.  If you would like to take over the counter (OTC) medication, Tylenol (acetaminophen) is ok.  However, some narcotic medications that are given for pain control contain acetaminophen as well. Therefore, you should not exceed more than 4000 mg of tylenol in a [place] if you do not have liver disease.  Also note that there are may OTC  medicines, such as cold medicines and allergy medicines that my contain tylenol as well.  If you have any questions about medications and/or interactions please ask your doctor/PA or your pharmacist.      ICE AND ELEVATE INJURED/OPERATIVE EXTREMITY  Using ice and elevating the injured extremity above your heart can help with swelling and pain control.  Icing in a pulsatile fashion, such as 20 minutes on and 20 minutes off, can be followed.    Do not place ice directly on skin. Make sure there is a barrier between to skin and the ice pack.    Using frozen items such as frozen peas works well as the conform nicely to the are that needs to be iced.  USE AN ACE WRAP OR TED HOSE FOR SWELLING CONTROL  In addition to icing and elevation, Ace wraps or TED hose are used to help limit and resolve swelling.  It is recommended to use Ace wraps or TED hose until you are informed to stop.    When using Ace  Wraps start the wrapping distally (farthest away from the body) and wrap proximally (closer to the body)   Example: If you had surgery on your leg or thing and you do not have a splint on, start the ace wrap at the toes and work your way up to the thigh        If you had surgery on your upper extremity and do not have a splint on, start the ace wrap at your fingers and work your way up to the upper arm    [place]: [phone]   VISIT OUR WEBSITE FOR ADDITIONAL INFORMATION: [email or web address]    Discharge Wound Care Instructions  Do NOT apply any ointments, solutions or lotions to pin sites or surgical wounds.  These prevent needed drainage and even though solutions like hydrogen peroxide kill bacteria, they also damage cells lining the pin sites that help fight infection.  Applying lotions or ointments can keep the wounds moist and can cause them to breakdown and open up as well. This can increase the risk for infection. When in doubt call the office.

## 2020-03-16 NOTE — Anesthesia Procedure Notes (Signed)
Procedure Name: Intubation Date/Time: 03/16/2020 12:21 PM Performed by: Alain Marion, CRNA Pre-anesthesia Checklist: Patient identified, Emergency Drugs available, Suction available and Patient being monitored Patient Re-evaluated:Patient Re-evaluated prior to induction Oxygen Delivery Method: Circle System Utilized Preoxygenation: Pre-oxygenation with 100% oxygen Induction Type: IV induction Ventilation: Mask ventilation without difficulty Laryngoscope Size: Miller and 2 Grade View: Grade I Tube type: Oral Tube size: 7.0 mm Number of attempts: 1 Airway Equipment and Method: Stylet Placement Confirmation: ETT inserted through vocal cords under direct vision,  positive ETCO2 and breath sounds checked- equal and bilateral Secured at: 21 cm Tube secured with: Tape Dental Injury: Teeth and Oropharynx as per pre-operative assessment

## 2020-03-16 NOTE — Anesthesia Postprocedure Evaluation (Signed)
Anesthesia Post Note  Patient: Rebecca [name]  Procedure(s) Performed: OPEN REDUCTION INTERNAL FIXATION (ORIF) TIBIAL PLATEAU (Right )     Patient location during evaluation: PACU Anesthesia Type: General Level of consciousness: awake and alert Pain management: pain level controlled Vital Signs Assessment: post-procedure vital signs reviewed and stable Respiratory status: spontaneous breathing, nonlabored ventilation and respiratory function stable Cardiovascular status: blood pressure returned to baseline and stable Postop Assessment: no apparent nausea or vomiting Anesthetic complications: no   No complications documented.  Last Vitals:  Vitals:   03/16/20 1612 03/16/20 1624  BP: (!) 147/66   Pulse: (!) 103   Resp: 10   Temp:  37.3 C  SpO2: 93%     Last Pain:  Vitals:   03/16/20 1624  TempSrc: Oral  PainSc:                  Lidia Collum
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
## 2020-03-16 NOTE — Transfer of Care (Signed)
Immediate Anesthesia Transfer of Care Note  Patient: Rebecca [name]  Procedure(s) Performed: OPEN REDUCTION INTERNAL FIXATION (ORIF) TIBIAL PLATEAU (Right )  Patient Location: PACU  Anesthesia Type:General  Level of Consciousness: awake, alert  and oriented  Airway & Oxygen Therapy: Patient Spontanous Breathing and Patient connected to face mask oxygen  Post-op Assessment: Report given to RN and Post -op Vital signs reviewed and stable  Post vital signs: Reviewed and stable  Last Vitals:  Vitals Value Taken Time  BP    Temp    Pulse    Resp    SpO2      Last Pain:  Vitals:   03/16/20 0824  TempSrc:   PainSc: 7          Complications: No complications documented.
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
## 2020-03-16 NOTE — Progress Notes (Signed)
Patient transferred to 4NP 13. Attempted to reach family, unable to reach anyone

## 2020-03-17 LAB — CBC
HCT: 31.8 % — ABNORMAL LOW (ref 36.0–46.0)
Hemoglobin: 10.3 g/dL — ABNORMAL LOW (ref 12.0–15.0)
MCH: 30 pg (ref 26.0–34.0)
MCHC: 32.4 g/dL (ref 30.0–36.0)
MCV: 92.7 fL (ref 80.0–100.0)
Platelets: 199 10*3/uL (ref 150–400)
RBC: 3.43 MIL/uL — ABNORMAL LOW (ref 3.87–5.11)
RDW: 12.9 % (ref 11.5–15.5)
WBC: 10.1 10*3/uL (ref 4.0–10.5)
nRBC: 0 % (ref 0.0–0.2)

## 2020-03-17 LAB — BASIC METABOLIC PANEL
Anion gap: 8 (ref 5–15)
BUN: 7 mg/dL — ABNORMAL LOW (ref 8–23)
CO2: 26 mmol/L (ref 22–32)
Calcium: 8.3 mg/dL — ABNORMAL LOW (ref 8.9–10.3)
Chloride: 104 mmol/L (ref 98–111)
Creatinine, Ser: 0.75 mg/dL (ref 0.44–1.00)
GFR, Estimated: >60 mL/min (ref >60)
Glucose, Bld: 151 mg/dL — ABNORMAL HIGH (ref 70–99)
Potassium: 4.4 mmol/L (ref 3.5–5.1)
Sodium: 138 mmol/L (ref 135–145)

## 2020-03-17 LAB — VITAMIN D 25 HYDROXY (VIT D DEFICIENCY, FRACTURES): Vit D, 25-Hydroxy: 26.18 ng/mL — ABNORMAL LOW (ref 30–100)

## 2020-03-17 MED ORDER — ASPIRIN 325 MG PO TABS
325.0000 mg | ORAL_TABLET | Freq: Two times a day (BID) | ORAL | Status: DC
  Administered 2020-03-17 – 2020-03-19 (×4): 325 mg via ORAL
  Filled 2020-03-17 (×4): qty 1

## 2020-03-17 MED ORDER — BETHANECHOL CHLORIDE 10 MG PO TABS
10.0000 mg | ORAL_TABLET | Freq: Three times a day (TID) | ORAL | Status: DC
  Administered 2020-03-17 – 2020-03-18 (×3): 10 mg via ORAL
  Filled 2020-03-17 (×3): qty 1

## 2020-03-17 MED ORDER — MORPHINE SULFATE (PF) 2 MG/ML IV SOLN: 2.0000 mg | INTRAVENOUS | Status: DC | PRN

## 2020-03-17 MED ORDER — POLYETHYLENE GLYCOL 3350 17 G PO PACK
17.0000 g | PACK | Freq: Every day | ORAL | Status: DC
  Administered 2020-03-19: 10:00:00 17 g via ORAL
  Filled 2020-03-17 (×3): qty 1

## 2020-03-17 MED ORDER — VITAMIN D 25 MCG (1000 UNIT) PO TABS
2000.0000 [IU] | ORAL_TABLET | Freq: Every day | ORAL | Status: DC
  Administered 2020-03-17 – 2020-03-19 (×3): 2000 [IU] via ORAL
  Filled 2020-03-17 (×3): qty 2

## 2020-03-17 NOTE — Progress Notes (Signed)
Pt had not voided since foley removal this am by nightshift around 0530. Pt bladder scanned at 307 and again at 321. Orders from [facility] [place], for foley re-insertion and pt started on Urecholine.   Justice Rocher, RN

## 2020-03-17 NOTE — Progress Notes (Signed)
Orthopaedic Trauma Progress Note  SUBJECTIVE: Reports mild pain about operative site, rate about 5/10 currently. Pain controlled with oral medications. Having a little bit of diarrhea. No chest pain. No SOB. No nausea/vomiting. No other complaints. Husband at bedside  OBJECTIVE:  Vitals:   03/17/20 0300 03/17/20 0346  BP: 112/60   Pulse: 79   Resp: 17   Temp: 98.9 F (37.2 C) 98.9 F (37.2 C)  SpO2: 99%     General: Sitting up in bed, no acute distress.  Pleasant and cooperative Respiratory: No increased work of breathing.  Right lower extremity: Dressing clean, dry, intact.  Removed knee immobilizer to allow for range of motion as tolerated.  Ankle dorsiflexion/plantarflexion is intact.  Sensation is intact to light touch distally.  Compartments soft and compressible.+ DP pulse  IMAGING: Stable post op imaging.   LABS:  Results for orders placed or performed during the hospital encounter of 03/14/20 (from the past 24 hour(s))  CBC     Status: Abnormal   Collection Time: 03/17/20  1:37 AM  Result Value Ref Range   WBC 10.1 4.0 - 10.5 K/uL   RBC 3.43 (L) 3.87 - 5.11 MIL/uL   Hemoglobin 10.3 (L) 12.0 - 15.0 g/dL   HCT 31.8 (L) 36.0 - 46.0 %   MCV 92.7 80.0 - 100.0 fL   MCH 30.0 26.0 - 34.0 pg   MCHC 32.4 30.0 - 36.0 g/dL   RDW 12.9 11.5 - 15.5 %   Platelets 199 150 - 400 K/uL   nRBC 0.0 0.0 - 0.2 %  Basic metabolic panel     Status: Abnormal   Collection Time: 03/17/20  1:37 AM  Result Value Ref Range   Sodium 138 135 - 145 mmol/L   Potassium 4.4 3.5 - 5.1 mmol/L   Chloride 104 98 - 111 mmol/L   CO2 26 22 - 32 mmol/L   Glucose, Bld 151 (H) 70 - 99 mg/dL   BUN 7 (L) 8 - 23 mg/dL   Creatinine, Ser 0.75 0.44 - 1.00 mg/dL   Calcium 8.3 (L) 8.9 - 10.3 mg/dL   GFR, Estimated >60 >60 mL/min   Anion gap 8 5 - 15  VITAMIN D 25 Hydroxy (Vit-D Deficiency, Fractures)     Status: Abnormal   Collection Time: 03/17/20  1:37 AM  Result Value Ref Range   Vit D, 25-Hydroxy 26.18 (L)  30 - 100 ng/mL    ASSESSMENT: Clovia Reine Aloi is a 70 y.o. female, 1 [place] Post-Op   Injuries: Right medial tibial plateau fracture s/p ORIF   CV/Blood loss: Acute blood loss anemia, Hgb 10.3 this morning. Hemodynamically stable  PLAN: Weightbearing: NWB RLE Incisional and dressing care: Plan to remove dressings tomorrow and leave incisions open to air Showering: Okay to begin showering with assistance on 03/19/2020 Orthopedic device(s):  Knee immobilizer at night x2 weeks.   Okay to remove immobilizer during the [place] for unrestricted knee ROM Pain management:  1. Tylenol 650 mg q 6 hours scheduled 2. Robaxin 1000 mg q 8 hours scheduled 3. Oxycodone 5-10 mg q 4 hours PRN 4. Morphine 2 mg q 2 hours PRN VTE prophylaxis: Okay to begin Lovenox from orthopedic standpoint once cleared by trauma and neurosurgery. If Lovenox not allowed, ok for ASA 325 mg BID as this is the plan for d/c ID:  Ancef 2gm post op Foley/Lines:  No foley, KVO IVFs Impediments to Fracture Healing: Vitamin D level 26, start on D3 supplementation Dispo: PT/OT evaluation today.  Okay for discharge from ortho standpoint once cleared by trauma team and therapies Follow - up plan: 2 weeks for repeat x-rays right knee  Contact information:  Katha Hamming MD, Patrecia Pace PA-C. After hours and holidays please check [email or web address] for group call information for [facility]   Jameriah Trotti A. Ricci Barker, PA-C [phone] (office) [email or web address]

## 2020-03-17 NOTE — Progress Notes (Signed)
2100: pt arrived to 4NP13 via bed. Pt A+Ox4. VS stable. IV intact and infusing. Foley intact. Belongings at bedside. Pt oriented to unit. Call bell in reach. Bed in lowest position and bed alarm set. Pt place on 2L [place] after transfer.
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
## 2020-03-17 NOTE — Care Management Important Message (Signed)
Important Message  Patient Details  Name: Rebecca [name] MRN: [id number] Date of Birth: May 21, 1950   Medicare Important Message Given:  Yes     Orbie Pyo 03/17/2020, 3:30 PM

## 2020-03-17 NOTE — TOC Progression Note (Signed)
Transition of Care [facility]) - Progression Note    Patient Details  Name: Rebecca [name] MRN: [id number] Date of Birth: 1950/10/15  Transition of Care [facility]) CM/SW Contact  Oren Section Cleta Alberts, RN Phone Number: 03/17/2020, 3:57 PM  Clinical Narrative: Pt for likely discharge home tomorrow with husband and daughter.  PT/OT recommending HH follow up, and pt agreeable to services.  Referral to [facility], per pt choice.  Referral to [place] for recommended DME, to be delivered to bedside prior to dc.  Husband and daughter to provide 24h care at discharge.      Expected Discharge Plan: [place] Barriers to Discharge: Barriers Resolved  Expected Discharge Plan and Services Expected Discharge Plan: [place]   Discharge Planning Services: CM Consult Post Acute Care Choice: [place] arrangements for the past 2 months: Single Family Home                 DME Arranged: 3-N-1,Walker rolling,Wheelchair manual DME Agency: [employer] Date DME Agency Contacted: 03/17/20 Time DME Agency Contacted: 4920 Representative spoke with at DME Agency: [place]: PT,OT [place]: [place] Date Luis M. [place]: 03/17/20 Time [place]: 1007 Representative spoke with at [place]: [place] ([place]) Interventions    Readmission Risk Interventions Readmission Risk Prevention Plan 03/17/2020  Post Dischage Appt Complete  Medication Screening Complete  Transportation Screening Complete
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
## 2020-03-17 NOTE — Progress Notes (Signed)
Discussed with NSGY about anticoagulation. Clarified that they recommend no Lovenox, including prophylactic dosing, at this time. Spoke with Orthopedics. They are okay for ASA 325mg  BID as inpatient. Will d/c Lovenox and start ASA 325mg  BID.

## 2020-03-18 LAB — CBC
HCT: 27.8 % — ABNORMAL LOW (ref 36.0–46.0)
Hemoglobin: 9.5 g/dL — ABNORMAL LOW (ref 12.0–15.0)
MCH: 31.5 pg (ref 26.0–34.0)
MCHC: 34.2 g/dL (ref 30.0–36.0)
MCV: 92.1 fL (ref 80.0–100.0)
Platelets: 208 10*3/uL (ref 150–400)
RBC: 3.02 MIL/uL — ABNORMAL LOW (ref 3.87–5.11)
RDW: 13 % (ref 11.5–15.5)
WBC: 5.7 10*3/uL (ref 4.0–10.5)
nRBC: 0 % (ref 0.0–0.2)

## 2020-03-18 LAB — BASIC METABOLIC PANEL
Anion gap: 8 (ref 5–15)
BUN: 6 mg/dL — ABNORMAL LOW (ref 8–23)
CO2: 28 mmol/L (ref 22–32)
Calcium: 7.9 mg/dL — ABNORMAL LOW (ref 8.9–10.3)
Chloride: 104 mmol/L (ref 98–111)
Creatinine, Ser: 0.71 mg/dL (ref 0.44–1.00)
GFR, Estimated: >60 mL/min (ref >60)
Glucose, Bld: 98 mg/dL (ref 70–99)
Potassium: 4.1 mmol/L (ref 3.5–5.1)
Sodium: 140 mmol/L (ref 135–145)

## 2020-03-18 MED ORDER — LACTATED RINGERS IV BOLUS
500.0000 mL | Freq: Once | INTRAVENOUS | Status: AC
  Administered 2020-03-18: 12:00:00 500 mL via INTRAVENOUS

## 2020-03-18 MED ORDER — BETHANECHOL CHLORIDE 25 MG PO TABS
25.0000 mg | ORAL_TABLET | Freq: Three times a day (TID) | ORAL | Status: DC
  Administered 2020-03-18 – 2020-03-19 (×3): 25 mg via ORAL
  Filled 2020-03-18 (×3): qty 1

## 2020-03-18 NOTE — Plan of Care (Signed)
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
## 2020-03-18 NOTE — Progress Notes (Addendum)
Orthopaedic Trauma Progress Note  SUBJECTIVE: Reports mild pain about operative site. Pain controlled with oral medications but states she doesn't feel much better than yesterday. Overall states she just feels "lousy". Had foley replaced yesterday. No chest pain. No SOB. No nausea/vomiting. No other complaints. Was able to move around with therapies yesterday and did well with this. Is unsure if she feels ready to go home today. Daughter at bedside  OBJECTIVE:  Vitals:   03/18/20 0000 03/18/20 0400  BP: (!) 109/55 115/60  Pulse: 77 82  Resp: 12 (!) 25  Temp: 98.4 F (36.9 C) 98.7 F (37.1 C)  SpO2: 91% 92%    General: Laying in bed, no acute distress.  Pleasant and cooperative Respiratory: No increased work of breathing.  Right lower extremity: Dressing removed, incisions clean, dry, intact with steri-strips in place.  Removed knee immobilizer to allow for range of motion as tolerated. Tolerates gentle knee motion.  Ankle dorsiflexion/plantarflexion is intact.  Sensation is intact to light touch distally.  Compartments soft and compressible.+ DP pulse  IMAGING: Stable post op imaging.   LABS:  Results for orders placed or performed during the hospital encounter of 03/14/20 (from the past 24 hour(s))  CBC     Status: Abnormal   Collection Time: 03/18/20  2:58 AM  Result Value Ref Range   WBC 5.7 4.0 - 10.5 K/uL   RBC 3.02 (L) 3.87 - 5.11 MIL/uL   Hemoglobin 9.5 (L) 12.0 - 15.0 g/dL   HCT 27.8 (L) 36.0 - 46.0 %   MCV 92.1 80.0 - 100.0 fL   MCH 31.5 26.0 - 34.0 pg   MCHC 34.2 30.0 - 36.0 g/dL   RDW 13.0 11.5 - 15.5 %   Platelets 208 150 - 400 K/uL   nRBC 0.0 0.0 - 0.2 %  Basic metabolic panel     Status: Abnormal   Collection Time: 03/18/20  2:58 AM  Result Value Ref Range   Sodium 140 135 - 145 mmol/L   Potassium 4.1 3.5 - 5.1 mmol/L   Chloride 104 98 - 111 mmol/L   CO2 28 22 - 32 mmol/L   Glucose, Bld 98 70 - 99 mg/dL   BUN 6 (L) 8 - 23 mg/dL   Creatinine, Ser 0.71 0.44 -  1.00 mg/dL   Calcium 7.9 (L) 8.9 - 10.3 mg/dL   GFR, Estimated >60 >60 mL/min   Anion gap 8 5 - 15    ASSESSMENT: Rebecca [name] is a 69 y.o. female, 2 Days Post-Op   Injuries: Right medial tibial plateau fracture s/p ORIF   CV/Blood loss: Acute blood loss anemia, Hgb 9.5 this morning. Hemodynamically stable  PLAN: Weightbearing: NWB RLE Incisional and dressing care: Plan to remove dressings tomorrow and leave incisions open to air Showering: Okay to begin showering with assistance on 03/19/2020 Orthopedic device(s):  Knee immobilizer at night x2 weeks.   Okay to remove immobilizer during the [place] for unrestricted knee ROM Pain management:  1. Tylenol 650 mg q 6 hours scheduled 2. Robaxin 1000 mg q 8 hours scheduled 3. Oxycodone 5-10 mg q 4 hours PRN 4. Morphine 2 mg q 2 hours PRN VTE prophylaxis: ASA 325 mg BID. Continue this at d/c ID:  Ancef 2gm post op completed Foley/Lines:  Foley in place, KVO IVFs Impediments to Fracture Healing: Vitamin D level 26, continue on D3 supplementation Dispo: Therapies as tolerated. PT/OT recommend HH. Okay for discharge from ortho standpoint once cleared by trauma team and therapies Follow -  up plan: 2 weeks for repeat x-rays right knee  Contact information:  Katha Hamming MD, Patrecia Pace PA-C. After hours and holidays please check [email or web address] for group call information for [facility]   Rebecca Julio A. Ricci Barker, PA-C [phone] (office) [email or web address]

## 2020-03-18 NOTE — Progress Notes (Signed)
Patient suffers from SDH, Occipital skull fracture and R tibial plateaufxthat is status post ORIF which impairs their ability to perform daily activities like bathing, dressing, grooming, and toileting in the home.  A cane, crutch, or walker will not resolve issue with performing activities of daily living. A wheelchair will allow patient to safely perform daily activities. Patient can safely propel the wheelchair in the home or has a caregiver who can provide assistance. Length of need 6 months . Accessories: elevating leg rests (ELRs), wheel locks, extensions and anti-tippers.

## 2020-03-19 LAB — CBC
HCT: 27.9 % — ABNORMAL LOW (ref 36.0–46.0)
Hemoglobin: 9.7 g/dL — ABNORMAL LOW (ref 12.0–15.0)
MCH: 31.6 pg (ref 26.0–34.0)
MCHC: 34.8 g/dL (ref 30.0–36.0)
MCV: 90.9 fL (ref 80.0–100.0)
Platelets: 242 10*3/uL (ref 150–400)
RBC: 3.07 MIL/uL — ABNORMAL LOW (ref 3.87–5.11)
RDW: 12.7 % (ref 11.5–15.5)
WBC: 7.9 10*3/uL (ref 4.0–10.5)
nRBC: 0 % (ref 0.0–0.2)

## 2020-03-19 LAB — BASIC METABOLIC PANEL
Anion gap: 11 (ref 5–15)
BUN: 6 mg/dL — ABNORMAL LOW (ref 8–23)
CO2: 23 mmol/L (ref 22–32)
Calcium: 7.9 mg/dL — ABNORMAL LOW (ref 8.9–10.3)
Chloride: 104 mmol/L (ref 98–111)
Creatinine, Ser: 0.63 mg/dL (ref 0.44–1.00)
GFR, Estimated: >60 mL/min (ref >60)
Glucose, Bld: 83 mg/dL (ref 70–99)
Potassium: 3.6 mmol/L (ref 3.5–5.1)
Sodium: 138 mmol/L (ref 135–145)

## 2020-03-19 MED ORDER — ONDANSETRON 4 MG PO TBDP: 4.0000 mg | ORAL_TABLET | Freq: Four times a day (QID) | ORAL | 0 refills | Status: DC | PRN

## 2020-03-19 MED ORDER — ASPIRIN 325 MG PO TABS: 325.0000 mg | ORAL_TABLET | Freq: Two times a day (BID) | ORAL | 0 refills | Status: DC

## 2020-03-19 MED ORDER — ACETAMINOPHEN 325 MG PO TABS: 650.0000 mg | ORAL_TABLET | Freq: Four times a day (QID) | ORAL | 0 refills | Status: DC | PRN

## 2020-03-19 MED ORDER — LEVETIRACETAM 500 MG PO TABS: 500.0000 mg | ORAL_TABLET | Freq: Two times a day (BID) | ORAL | 0 refills | Status: DC

## 2020-03-19 MED ORDER — OXYCODONE HCL 5 MG PO TABS: 5.0000 mg | ORAL_TABLET | Freq: Four times a day (QID) | ORAL | 0 refills | Status: DC | PRN

## 2020-03-19 MED ORDER — VITAMIN D3 25 MCG PO TABS: 2000.0000 [IU] | ORAL_TABLET | Freq: Every day | ORAL | 0 refills | Status: DC

## 2020-03-19 MED ORDER — METHOCARBAMOL 500 MG PO TABS: 1000.0000 mg | ORAL_TABLET | Freq: Three times a day (TID) | ORAL | 0 refills | Status: DC | PRN

## 2020-03-19 MED ORDER — DOCUSATE SODIUM 100 MG PO CAPS: 100.0000 mg | ORAL_CAPSULE | Freq: Two times a day (BID) | ORAL | 0 refills | Status: DC | PRN

## 2020-03-19 MED ORDER — POLYETHYLENE GLYCOL 3350 17 G PO PACK: 17.0000 g | PACK | Freq: Every day | ORAL | 0 refills | Status: DC | PRN
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
## 2020-03-19 NOTE — Progress Notes (Signed)
Daughter Lelon Frohlich called and spoke with RN to inquire about specific information regarding the discharge of the patient. The patient and spouse declined for RN to release information. Patient is alert and oriented x 4. HIPPA and privacy respected and information will only be shared between the patient and the spouse.

## 2020-03-21 ENCOUNTER — Encounter (HOSPITAL_COMMUNITY): Payer: Self-pay | Admitting: Student

## 2020-03-22 ENCOUNTER — Telehealth: Payer: Self-pay

## 2020-03-22 ENCOUNTER — Telehealth: Payer: Self-pay | Admitting: Family

## 2020-03-22 ENCOUNTER — Encounter: Payer: Self-pay | Admitting: Family

## 2020-03-22 ENCOUNTER — Telehealth (INDEPENDENT_AMBULATORY_CARE_PROVIDER_SITE_OTHER): Payer: Medicare Other | Admitting: Family

## 2020-03-22 ENCOUNTER — Other Ambulatory Visit: Payer: Self-pay

## 2020-03-22 DIAGNOSIS — S065XAA Traumatic subdural hemorrhage with loss of consciousness status unknown, initial encounter: Secondary | ICD-10-CM

## 2020-03-22 DIAGNOSIS — M79604 Pain in right leg: Secondary | ICD-10-CM

## 2020-03-22 DIAGNOSIS — Z8781 Personal history of (healed) traumatic fracture: Secondary | ICD-10-CM

## 2020-03-22 DIAGNOSIS — Z9889 Other specified postprocedural states: Secondary | ICD-10-CM | POA: Diagnosis not present

## 2020-03-22 DIAGNOSIS — S065X9A Traumatic subdural hemorrhage with loss of consciousness of unspecified duration, initial encounter: Secondary | ICD-10-CM

## 2020-03-22 DIAGNOSIS — J4520 Mild intermittent asthma, uncomplicated: Secondary | ICD-10-CM | POA: Diagnosis not present

## 2020-03-22 MED ORDER — OXYCODONE HCL 5 MG PO TABS: 5.0000 mg | ORAL_TABLET | Freq: Four times a day (QID) | ORAL | 0 refills | Status: DC | PRN

## 2020-03-22 NOTE — Telephone Encounter (Signed)
I called Rebecca [name] to set up a fasting lab appointment, Rebecca [name] wanted her to have some lab work done sometime around January 11th that will work for the patient.

## 2020-03-22 NOTE — Telephone Encounter (Signed)
.  Rebecca [name], Rebecca [name] are scheduled for a virtual visit with your provider today.    Just as we do with appointments in the office, we must obtain your consent to participate.  Your consent will be active for this visit and any virtual visit you may have with one of our providers in the next 365 days.    If you have a MyChart account, I can also send a copy of this consent to you electronically.  All virtual visits are billed to your insurance company just like a traditional visit in the office.  As this is a virtual visit, video technology does not allow for your provider to perform a traditional examination.  This may limit your provider's ability to fully assess your condition.  If your provider identifies any concerns that need to be evaluated in person or the need to arrange testing such as labs, EKG, etc, we will make arrangements to do so.    Although advances in technology are sophisticated, we cannot ensure that it will always work on either your end or our end.  If the connection with a video visit is poor, we may have to switch to a telephone visit.  With either a video or telephone visit, we are not always able to ensure that we have a secure connection.   I need to obtain your verbal consent now.   Are you willing to proceed with your visit today?    Rebecca [name] has provided verbal consent on 03/22/2020 for a virtual visit (video or telephone).   Tanna Savoy, Goehner 03/22/2020  12:42 PM
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
## 2020-03-23 ENCOUNTER — Telehealth: Payer: Self-pay | Admitting: Physician Assistant

## 2020-03-24 ENCOUNTER — Other Ambulatory Visit: Payer: Self-pay

## 2020-03-24 ENCOUNTER — Encounter (HOSPITAL_COMMUNITY): Payer: Self-pay

## 2020-03-24 ENCOUNTER — Emergency Department (HOSPITAL_COMMUNITY)
Admission: EM | Admit: 2020-03-24 | Discharge: 2020-03-24 | Disposition: A | Discharge status: Home / Self Care | Payer: Medicare Other | Attending: Emergency Medicine | Admitting: Emergency Medicine

## 2020-03-24 DIAGNOSIS — Z7982 Long term (current) use of aspirin: Secondary | ICD-10-CM | POA: Diagnosis not present

## 2020-03-24 DIAGNOSIS — N3000 Acute cystitis without hematuria: Secondary | ICD-10-CM | POA: Diagnosis not present

## 2020-03-24 DIAGNOSIS — M79604 Pain in right leg: Secondary | ICD-10-CM | POA: Insufficient documentation

## 2020-03-24 DIAGNOSIS — Z79899 Other long term (current) drug therapy: Secondary | ICD-10-CM | POA: Insufficient documentation

## 2020-03-24 DIAGNOSIS — J4520 Mild intermittent asthma, uncomplicated: Secondary | ICD-10-CM | POA: Diagnosis not present

## 2020-03-24 DIAGNOSIS — M7989 Other specified soft tissue disorders: Secondary | ICD-10-CM | POA: Diagnosis not present

## 2020-03-24 DIAGNOSIS — R339 Retention of urine, unspecified: Secondary | ICD-10-CM | POA: Diagnosis not present

## 2020-03-24 LAB — CBC WITH DIFFERENTIAL/PLATELET
Abs Immature Granulocytes: 0.18 10*3/uL — ABNORMAL HIGH (ref 0.00–0.07)
Basophils Absolute: 0.1 10*3/uL (ref 0.0–0.1)
Basophils Relative: 0 %
Eosinophils Absolute: 0.2 10*3/uL (ref 0.0–0.5)
Eosinophils Relative: 1 %
HCT: 37.1 % (ref 36.0–46.0)
Hemoglobin: 12.8 g/dL (ref 12.0–15.0)
Immature Granulocytes: 1 %
Lymphocytes Relative: 14 %
Lymphs Abs: 2.3 10*3/uL (ref 0.7–4.0)
MCH: 31.5 pg (ref 26.0–34.0)
MCHC: 34.5 g/dL (ref 30.0–36.0)
MCV: 91.4 fL (ref 80.0–100.0)
Monocytes Absolute: 1.9 10*3/uL — ABNORMAL HIGH (ref 0.1–1.0)
Monocytes Relative: 12 %
Neutro Abs: 11.9 10*3/uL — ABNORMAL HIGH (ref 1.7–7.7)
Neutrophils Relative %: 72 %
Platelets: 561 10*3/uL — ABNORMAL HIGH (ref 150–400)
RBC: 4.06 MIL/uL (ref 3.87–5.11)
RDW: 13.8 % (ref 11.5–15.5)
WBC: 16.5 10*3/uL — ABNORMAL HIGH (ref 4.0–10.5)
nRBC: 0 % (ref 0.0–0.2)

## 2020-03-24 LAB — URINALYSIS, ROUTINE W REFLEX MICROSCOPIC
Bilirubin Urine: NEGATIVE
Glucose, UA: NEGATIVE mg/dL
Hgb urine dipstick: NEGATIVE
Ketones, ur: NEGATIVE mg/dL
Leukocytes,Ua: NEGATIVE
Nitrite: POSITIVE — AB
Protein, ur: NEGATIVE mg/dL
Specific Gravity, Urine: 1.011 (ref 1.005–1.030)
pH: 7 (ref 5.0–8.0)

## 2020-03-24 LAB — BASIC METABOLIC PANEL
Anion gap: 13 (ref 5–15)
BUN: 12 mg/dL (ref 8–23)
CO2: 22 mmol/L (ref 22–32)
Calcium: 8.8 mg/dL — ABNORMAL LOW (ref 8.9–10.3)
Chloride: 99 mmol/L (ref 98–111)
Creatinine, Ser: 0.6 mg/dL (ref 0.44–1.00)
GFR, Estimated: >60 mL/min (ref >60)
Glucose, Bld: 93 mg/dL (ref 70–99)
Potassium: 3.4 mmol/L — ABNORMAL LOW (ref 3.5–5.1)
Sodium: 134 mmol/L — ABNORMAL LOW (ref 135–145)

## 2020-03-24 MED ORDER — CEPHALEXIN 500 MG PO CAPS: 500.0000 mg | ORAL_CAPSULE | Freq: Four times a day (QID) | ORAL | 0 refills | Status: AC

## 2020-03-24 MED ORDER — PHENAZOPYRIDINE HCL 95 MG PO TABS: 95.0000 mg | ORAL_TABLET | Freq: Three times a day (TID) | ORAL | 0 refills | Status: DC | PRN

## 2020-03-24 MED ORDER — CEFTRIAXONE SODIUM 1 G IJ SOLR
1.0000 g | Freq: Once | INTRAMUSCULAR | Status: AC
  Administered 2020-03-24: 17:00:00 1 g via INTRAMUSCULAR
  Filled 2020-03-24: qty 10

## 2020-03-24 MED ORDER — LIDOCAINE HCL (PF) 1 % IJ SOLN
INTRAMUSCULAR | Status: AC
  Administered 2020-03-24: 17:00:00 2.1 mL
  Filled 2020-03-24: qty 5

## 2020-03-24 MED ORDER — HYDROCODONE-ACETAMINOPHEN 5-325 MG PO TABS
1.0000 | ORAL_TABLET | Freq: Once | ORAL | Status: AC
  Administered 2020-03-24: 14:00:00 1 via ORAL
  Filled 2020-03-24: qty 1

## 2020-03-24 NOTE — Discharge Instructions (Signed)
Take the Pyridium as needed for bladder spasms  You were given an antibiotic in the emergency department.  You were sent in Keflex for an oral antibiotic.  You may continue to take the oxycodone as needed for your leg pain.  Return for any new or worsening symptoms.  Follow-up with urology next week to get your Foley catheter removed.  The medication, Pyridium for bladder spasms may turn the urine a red/orange color.  This is to be expected and is not abnormal.
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
## 2020-03-24 NOTE — ED Notes (Signed)
Discharge instructions provided to patient and family. Verbalized understanding. Foley catheter educated provided and return demonstration performed. Alert and oriented. Escorted out of ED via w/c.
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
## 2020-03-27 ENCOUNTER — Other Ambulatory Visit: Payer: Self-pay | Admitting: Family

## 2020-03-27 LAB — URINE CULTURE: Culture: 10000 — AB

## 2020-03-28 NOTE — Progress Notes (Signed)
ED Antimicrobial Stewardship Positive Culture Follow Up   Rebecca [name] is an 69 y.o. female who presented to [facility] on 03/24/2020 with a chief complaint of  Chief Complaint  Patient presents with   Urinary Retention    Recent Results (from the past 720 hour(s))  Resp Panel by RT-PCR (Flu A&B, Covid) Nasopharyngeal Swab     Status: None   Collection Time: 03/14/20  7:09 PM   Specimen: Nasopharyngeal Swab; Nasopharyngeal(NP) swabs in vial transport medium  Result Value Ref Range Status   SARS Coronavirus 2 by RT PCR NEGATIVE NEGATIVE Final    Comment: (NOTE) SARS-CoV-2 target nucleic acids are NOT DETECTED.  The SARS-CoV-2 RNA is generally detectable in upper respiratory specimens during the acute phase of infection. The lowest concentration of SARS-CoV-2 viral copies this assay can detect is 138 copies/mL. A negative result does not preclude SARS-Cov-2 infection and should not be used as the sole basis for treatment or other patient management decisions. A negative result may occur with  improper specimen collection/handling, submission of specimen other than nasopharyngeal swab, presence of viral mutation(s) within the areas targeted by this assay, and inadequate number of viral copies(<138 copies/mL). A negative result must be combined with clinical observations, patient history, and epidemiological information. The expected result is Negative.  Fact Sheet for Patients:  EntrepreneurPulse.com.au  Fact Sheet for Healthcare Providers:  IncredibleEmployment.be  This test is no t yet approved or cleared by the [place] FDA and  has been authorized for detection and/or diagnosis of SARS-CoV-2 by FDA under an Emergency Use Authorization (EUA). This EUA will remain  in effect (meaning this test can be used) for the duration of the COVID-19 declaration under Section 564(b)(1) of the Act, 21 U.S.C.section 360bbb-3(b)(1), unless the  authorization is terminated  or revoked sooner.       Influenza A by PCR NEGATIVE NEGATIVE Final   Influenza B by PCR NEGATIVE NEGATIVE Final    Comment: (NOTE) The Xpert Xpress SARS-CoV-2/FLU/RSV plus assay is intended as an aid in the diagnosis of influenza from Nasopharyngeal swab specimens and should not be used as a sole basis for treatment. Nasal washings and aspirates are unacceptable for Xpert Xpress SARS-CoV-2/FLU/RSV testing.  Fact Sheet for Patients: EntrepreneurPulse.com.au  Fact Sheet for Healthcare Providers: IncredibleEmployment.be  This test is not yet approved or cleared by the [place] FDA and has been authorized for detection and/or diagnosis of SARS-CoV-2 by FDA under an Emergency Use Authorization (EUA). This EUA will remain in effect (meaning this test can be used) for the duration of the COVID-19 declaration under Section 564(b)(1) of the Act, 21 U.S.C. section 360bbb-3(b)(1), unless the authorization is terminated or revoked.  Performed at [facility], [place] [address], [place], [place] [address]   MRSA PCR Screening     Status: None   Collection Time: 03/14/20  9:38 PM   Specimen: Nasal Mucosa; Nasopharyngeal  Result Value Ref Range Status   MRSA by PCR NEGATIVE NEGATIVE Final    Comment:        The GeneXpert MRSA Assay (FDA approved for NASAL specimens only), is one component of a comprehensive MRSA colonization surveillance program. It is not intended to diagnose MRSA infection nor to guide or monitor treatment for MRSA infections. Performed at [facility], [place] [address], [place], [place] [address]   Surgical pcr screen     Status: Abnormal   Collection Time: 03/16/20  1:36 AM   Specimen: Nasal Mucosa; Nasal  Swab  Result Value Ref Range Status   MRSA, PCR NEGATIVE NEGATIVE Final   Staphylococcus aureus POSITIVE (A) NEGATIVE Final    Comment: (NOTE) The Xpert SA Assay (FDA approved  for NASAL specimens in patients 58 years of age and older), is one component of a comprehensive surveillance program. It is not intended to diagnose infection nor to guide or monitor treatment. Performed at [facility], [place] [address], [place], [place] [address]   Urine culture     Status: Abnormal   Collection Time: 03/24/20  4:17 PM   Specimen: Urine, Catheterized  Result Value Ref Range Status   Specimen Description URINE, CATHETERIZED  Final   Special Requests   Final    NONE Performed at [facility], [address], [place], [place] [address]    Culture 10,000 COLONIES/mL PSEUDOMONAS AERUGINOSA (A)  Final   Report Status 03/27/2020 FINAL  Final   Organism ID, Bacteria PSEUDOMONAS AERUGINOSA (A)  Final      Susceptibility   Pseudomonas aeruginosa - MIC*    CEFTAZIDIME 4 SENSITIVE Sensitive     CIPROFLOXACIN <=0.25 SENSITIVE Sensitive     GENTAMICIN <=1 SENSITIVE Sensitive     IMIPENEM 1 SENSITIVE Sensitive     PIP/TAZO 16 SENSITIVE Sensitive     CEFEPIME 1 SENSITIVE Sensitive     * 10,000 COLONIES/mL PSEUDOMONAS AERUGINOSA    [x]  Treated with Keflex, organism resistant to prescribed antimicrobial  New antibiotic prescription: Flow Manager to call patient. If patient not experiencing symptoms (fever, flank pain, dysuria, etc.), then no treatment changes indicated at this time for low colony count of pseudomonas, foley placed, and patient already with Urology f/u. If patient experiencing symptoms, stop Keflex and start Cipro 250mg  PO q12h x 3 days.  ED Provider: Kennith Maes, PA-C   Margretta Sidle Dohlen 03/28/2020, 8:30 AM Clinical Pharmacist Monday - Friday phone -  [phone] Saturday - Sunday phone - [phone]

## 2020-03-31 ENCOUNTER — Encounter: Payer: Self-pay | Admitting: *Deleted

## 2020-03-31 ENCOUNTER — Other Ambulatory Visit: Payer: Self-pay | Admitting: Family

## 2020-03-31 DIAGNOSIS — M79604 Pain in right leg: Secondary | ICD-10-CM

## 2020-03-31 MED ORDER — OXYCODONE HCL 5 MG PO TABS: 5.0000 mg | ORAL_TABLET | Freq: Four times a day (QID) | ORAL | 0 refills | Status: AC | PRN

## 2020-03-31 NOTE — Telephone Encounter (Signed)
Ngetich, Dinah C, NP  You 53 minutes ago (3:27 PM)     Schedule for narcotic contract in office   Routing comment        Called and LMOM for Caryn Bee to return call to schedule follow up appointment for patient.

## 2020-03-31 NOTE — Telephone Encounter (Signed)
Rebecca [name] is running out of her medication Oxycodone on Monday morning, [facility] [address] [phone]  Call patient back at [phone]

## 2020-03-31 NOTE — Telephone Encounter (Signed)
error 

## 2020-03-31 NOTE — Telephone Encounter (Signed)
Patient requesting refill.  Epic LR: 03/22/2020 #15 No Contract on File Pended Rx and sent to [facility] for approval.

## 2020-04-05 DIAGNOSIS — S82141D Displaced bicondylar fracture of right tibia, subsequent encounter for closed fracture with routine healing: Secondary | ICD-10-CM | POA: Diagnosis not present

## 2020-04-05 DIAGNOSIS — S2232XD Fracture of one rib, left side, subsequent encounter for fracture with routine healing: Secondary | ICD-10-CM | POA: Diagnosis not present

## 2020-04-05 DIAGNOSIS — S02119D Unspecified fracture of occiput, subsequent encounter for fracture with routine healing: Secondary | ICD-10-CM | POA: Diagnosis not present

## 2020-04-05 DIAGNOSIS — S065X0D Traumatic subdural hemorrhage without loss of consciousness, subsequent encounter: Secondary | ICD-10-CM | POA: Diagnosis not present

## 2020-04-05 DIAGNOSIS — S22069D Unspecified fracture of T7-T8 vertebra, subsequent encounter for fracture with routine healing: Secondary | ICD-10-CM | POA: Diagnosis not present

## 2020-04-06 NOTE — Telephone Encounter (Signed)
Office contacted 12/22 that patient had urinary rentention. Patient was recommended to go to the ED. It appears that she followed these recommendations on chart review.

## 2020-04-07 DIAGNOSIS — S22069D Unspecified fracture of T7-T8 vertebra, subsequent encounter for fracture with routine healing: Secondary | ICD-10-CM | POA: Diagnosis not present

## 2020-04-07 DIAGNOSIS — Z681 Body mass index (BMI) 19 or less, adult: Secondary | ICD-10-CM | POA: Insufficient documentation

## 2020-04-07 DIAGNOSIS — S2232XD Fracture of one rib, left side, subsequent encounter for fracture with routine healing: Secondary | ICD-10-CM | POA: Diagnosis not present

## 2020-04-07 DIAGNOSIS — S82141D Displaced bicondylar fracture of right tibia, subsequent encounter for closed fracture with routine healing: Secondary | ICD-10-CM | POA: Diagnosis not present

## 2020-04-07 DIAGNOSIS — S065X0D Traumatic subdural hemorrhage without loss of consciousness, subsequent encounter: Secondary | ICD-10-CM | POA: Diagnosis not present

## 2020-04-07 DIAGNOSIS — S02119D Unspecified fracture of occiput, subsequent encounter for fracture with routine healing: Secondary | ICD-10-CM | POA: Diagnosis not present

## 2020-04-11 DIAGNOSIS — R338 Other retention of urine: Secondary | ICD-10-CM | POA: Diagnosis not present

## 2020-04-12 DIAGNOSIS — S82141D Displaced bicondylar fracture of right tibia, subsequent encounter for closed fracture with routine healing: Secondary | ICD-10-CM | POA: Diagnosis not present

## 2020-04-12 DIAGNOSIS — S065X0D Traumatic subdural hemorrhage without loss of consciousness, subsequent encounter: Secondary | ICD-10-CM | POA: Diagnosis not present

## 2020-04-12 DIAGNOSIS — S02119D Unspecified fracture of occiput, subsequent encounter for fracture with routine healing: Secondary | ICD-10-CM | POA: Diagnosis not present

## 2020-04-12 DIAGNOSIS — S22069D Unspecified fracture of T7-T8 vertebra, subsequent encounter for fracture with routine healing: Secondary | ICD-10-CM | POA: Diagnosis not present

## 2020-04-12 DIAGNOSIS — S2232XD Fracture of one rib, left side, subsequent encounter for fracture with routine healing: Secondary | ICD-10-CM | POA: Diagnosis not present

## 2020-04-14 DIAGNOSIS — S065X0D Traumatic subdural hemorrhage without loss of consciousness, subsequent encounter: Secondary | ICD-10-CM | POA: Diagnosis not present

## 2020-04-14 DIAGNOSIS — S82141D Displaced bicondylar fracture of right tibia, subsequent encounter for closed fracture with routine healing: Secondary | ICD-10-CM | POA: Diagnosis not present

## 2020-04-14 DIAGNOSIS — S2232XD Fracture of one rib, left side, subsequent encounter for fracture with routine healing: Secondary | ICD-10-CM | POA: Diagnosis not present

## 2020-04-14 DIAGNOSIS — S22069D Unspecified fracture of T7-T8 vertebra, subsequent encounter for fracture with routine healing: Secondary | ICD-10-CM | POA: Diagnosis not present

## 2020-04-14 DIAGNOSIS — S02119D Unspecified fracture of occiput, subsequent encounter for fracture with routine healing: Secondary | ICD-10-CM | POA: Diagnosis not present

## 2020-04-18 DIAGNOSIS — R531 Weakness: Secondary | ICD-10-CM | POA: Diagnosis not present

## 2020-04-22 DIAGNOSIS — H5211 Myopia, right eye: Secondary | ICD-10-CM | POA: Diagnosis not present

## 2020-04-22 DIAGNOSIS — H5202 Hypermetropia, left eye: Secondary | ICD-10-CM | POA: Diagnosis not present

## 2020-04-22 DIAGNOSIS — H52223 Regular astigmatism, bilateral: Secondary | ICD-10-CM | POA: Diagnosis not present

## 2020-04-22 DIAGNOSIS — H524 Presbyopia: Secondary | ICD-10-CM | POA: Diagnosis not present

## 2020-05-03 DIAGNOSIS — S82141D Displaced bicondylar fracture of right tibia, subsequent encounter for closed fracture with routine healing: Secondary | ICD-10-CM | POA: Diagnosis not present

## 2020-05-06 DIAGNOSIS — R2689 Other abnormalities of gait and mobility: Secondary | ICD-10-CM | POA: Diagnosis not present

## 2020-05-06 DIAGNOSIS — M79604 Pain in right leg: Secondary | ICD-10-CM | POA: Diagnosis not present

## 2020-05-11 DIAGNOSIS — R2689 Other abnormalities of gait and mobility: Secondary | ICD-10-CM | POA: Diagnosis not present

## 2020-05-11 DIAGNOSIS — M79604 Pain in right leg: Secondary | ICD-10-CM | POA: Diagnosis not present

## 2020-05-12 DIAGNOSIS — R338 Other retention of urine: Secondary | ICD-10-CM | POA: Diagnosis not present

## 2020-05-13 DIAGNOSIS — R2689 Other abnormalities of gait and mobility: Secondary | ICD-10-CM | POA: Diagnosis not present

## 2020-05-13 DIAGNOSIS — M79604 Pain in right leg: Secondary | ICD-10-CM | POA: Diagnosis not present

## 2020-05-18 DIAGNOSIS — M79604 Pain in right leg: Secondary | ICD-10-CM | POA: Diagnosis not present

## 2020-05-18 DIAGNOSIS — R2689 Other abnormalities of gait and mobility: Secondary | ICD-10-CM | POA: Diagnosis not present

## 2020-05-19 DIAGNOSIS — R531 Weakness: Secondary | ICD-10-CM | POA: Diagnosis not present

## 2020-05-20 DIAGNOSIS — M79604 Pain in right leg: Secondary | ICD-10-CM | POA: Diagnosis not present

## 2020-05-20 DIAGNOSIS — R2689 Other abnormalities of gait and mobility: Secondary | ICD-10-CM | POA: Diagnosis not present

## 2020-05-25 DIAGNOSIS — M79604 Pain in right leg: Secondary | ICD-10-CM | POA: Diagnosis not present

## 2020-05-25 DIAGNOSIS — R2689 Other abnormalities of gait and mobility: Secondary | ICD-10-CM | POA: Diagnosis not present

## 2020-05-27 DIAGNOSIS — M79604 Pain in right leg: Secondary | ICD-10-CM | POA: Diagnosis not present

## 2020-05-27 DIAGNOSIS — R2689 Other abnormalities of gait and mobility: Secondary | ICD-10-CM | POA: Diagnosis not present

## 2020-05-30 ENCOUNTER — Other Ambulatory Visit: Payer: Self-pay

## 2020-05-30 ENCOUNTER — Ambulatory Visit: Payer: Medicare Other | Admitting: Internal Medicine

## 2020-05-30 ENCOUNTER — Encounter: Payer: Self-pay | Admitting: Internal Medicine

## 2020-05-30 VITALS — BP 110/70 | HR 86 | Temp 97.3°F | Ht 64.0 in | Wt 96.4 lb

## 2020-05-30 DIAGNOSIS — J4520 Mild intermittent asthma, uncomplicated: Secondary | ICD-10-CM

## 2020-05-30 DIAGNOSIS — M79604 Pain in right leg: Secondary | ICD-10-CM

## 2020-05-30 DIAGNOSIS — Z9889 Other specified postprocedural states: Secondary | ICD-10-CM | POA: Diagnosis not present

## 2020-05-30 DIAGNOSIS — S065X9A Traumatic subdural hemorrhage with loss of consciousness of unspecified duration, initial encounter: Secondary | ICD-10-CM | POA: Diagnosis not present

## 2020-05-30 DIAGNOSIS — M810 Age-related osteoporosis without current pathological fracture: Secondary | ICD-10-CM | POA: Diagnosis not present

## 2020-05-30 DIAGNOSIS — Z8781 Personal history of (healed) traumatic fracture: Secondary | ICD-10-CM

## 2020-05-30 DIAGNOSIS — R339 Retention of urine, unspecified: Secondary | ICD-10-CM

## 2020-05-30 DIAGNOSIS — S065XAA Traumatic subdural hemorrhage with loss of consciousness status unknown, initial encounter: Secondary | ICD-10-CM

## 2020-05-30 DIAGNOSIS — R2689 Other abnormalities of gait and mobility: Secondary | ICD-10-CM | POA: Diagnosis not present
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
## 2020-05-31 DIAGNOSIS — M79604 Pain in right leg: Secondary | ICD-10-CM | POA: Diagnosis not present

## 2020-05-31 DIAGNOSIS — R2689 Other abnormalities of gait and mobility: Secondary | ICD-10-CM | POA: Diagnosis not present

## 2020-06-08 DIAGNOSIS — M79604 Pain in right leg: Secondary | ICD-10-CM | POA: Diagnosis not present

## 2020-06-08 DIAGNOSIS — R2689 Other abnormalities of gait and mobility: Secondary | ICD-10-CM | POA: Diagnosis not present

## 2020-06-10 DIAGNOSIS — M79604 Pain in right leg: Secondary | ICD-10-CM | POA: Diagnosis not present

## 2020-06-10 DIAGNOSIS — R2689 Other abnormalities of gait and mobility: Secondary | ICD-10-CM | POA: Diagnosis not present

## 2020-06-13 DIAGNOSIS — S82141D Displaced bicondylar fracture of right tibia, subsequent encounter for closed fracture with routine healing: Secondary | ICD-10-CM | POA: Diagnosis not present

## 2020-06-15 DIAGNOSIS — R2689 Other abnormalities of gait and mobility: Secondary | ICD-10-CM | POA: Diagnosis not present

## 2020-06-15 DIAGNOSIS — M79604 Pain in right leg: Secondary | ICD-10-CM | POA: Diagnosis not present

## 2020-06-16 DIAGNOSIS — R531 Weakness: Secondary | ICD-10-CM | POA: Diagnosis not present

## 2020-06-17 DIAGNOSIS — M79604 Pain in right leg: Secondary | ICD-10-CM | POA: Diagnosis not present

## 2020-06-17 DIAGNOSIS — R2689 Other abnormalities of gait and mobility: Secondary | ICD-10-CM | POA: Diagnosis not present

## 2020-06-21 DIAGNOSIS — M79604 Pain in right leg: Secondary | ICD-10-CM | POA: Diagnosis not present

## 2020-06-21 DIAGNOSIS — R2689 Other abnormalities of gait and mobility: Secondary | ICD-10-CM | POA: Diagnosis not present

## 2020-06-24 DIAGNOSIS — R2689 Other abnormalities of gait and mobility: Secondary | ICD-10-CM | POA: Diagnosis not present

## 2020-06-24 DIAGNOSIS — M79604 Pain in right leg: Secondary | ICD-10-CM | POA: Diagnosis not present

## 2020-06-29 DIAGNOSIS — R2689 Other abnormalities of gait and mobility: Secondary | ICD-10-CM | POA: Diagnosis not present

## 2020-06-29 DIAGNOSIS — M79604 Pain in right leg: Secondary | ICD-10-CM | POA: Diagnosis not present

## 2020-07-06 DIAGNOSIS — R2689 Other abnormalities of gait and mobility: Secondary | ICD-10-CM | POA: Diagnosis not present

## 2020-07-06 DIAGNOSIS — M79604 Pain in right leg: Secondary | ICD-10-CM | POA: Diagnosis not present

## 2020-07-17 DIAGNOSIS — R531 Weakness: Secondary | ICD-10-CM | POA: Diagnosis not present

## 2020-08-16 DIAGNOSIS — R531 Weakness: Secondary | ICD-10-CM | POA: Diagnosis not present

## 2020-09-16 DIAGNOSIS — R531 Weakness: Secondary | ICD-10-CM | POA: Diagnosis not present

## 2020-10-16 DIAGNOSIS — R531 Weakness: Secondary | ICD-10-CM | POA: Diagnosis not present

## 2020-11-16 DIAGNOSIS — R531 Weakness: Secondary | ICD-10-CM | POA: Diagnosis not present

## 2020-12-09 ENCOUNTER — Other Ambulatory Visit: Payer: Self-pay

## 2020-12-09 ENCOUNTER — Other Ambulatory Visit: Payer: Self-pay | Admitting: Family

## 2020-12-09 ENCOUNTER — Ambulatory Visit (INDEPENDENT_AMBULATORY_CARE_PROVIDER_SITE_OTHER): Payer: Medicare Other | Admitting: Family

## 2020-12-09 ENCOUNTER — Encounter: Payer: Self-pay | Admitting: Family

## 2020-12-09 DIAGNOSIS — Z1159 Encounter for screening for other viral diseases: Secondary | ICD-10-CM

## 2020-12-09 DIAGNOSIS — Z681 Body mass index (BMI) 19 or less, adult: Secondary | ICD-10-CM

## 2020-12-09 DIAGNOSIS — Z0000 Encounter for general adult medical examination without abnormal findings: Secondary | ICD-10-CM | POA: Diagnosis not present

## 2020-12-09 DIAGNOSIS — R636 Underweight: Secondary | ICD-10-CM

## 2020-12-09 DIAGNOSIS — Z1231 Encounter for screening mammogram for malignant neoplasm of breast: Secondary | ICD-10-CM | POA: Diagnosis not present

## 2020-12-09 DIAGNOSIS — E785 Hyperlipidemia, unspecified: Secondary | ICD-10-CM

## 2020-12-09 DIAGNOSIS — J4520 Mild intermittent asthma, uncomplicated: Secondary | ICD-10-CM

## 2020-12-09 NOTE — Progress Notes (Signed)
This service is provided via telemedicine  No vital signs collected/recorded due to the encounter was a telemedicine visit.   Location of patient (ex: home, work):  Home.  Patient consents to a telephone visit:  Yes  Location of the provider (ex: office, home):  [employer].   Name of any referring provider:  Eyden Dobie, Nelda Bucks, NP   Names of all persons participating in the telemedicine service and their role in the encounter:  Patient, Heriberto Antigua, [place], [place], Webb Silversmith, NP.    Time spent on call: 8 minutes spent on the phone with Medical Assistant.     Subjective:   Rebecca [name] is a 70 y.o. female who presents for Medicare Annual (Subsequent) preventive examination.  Review of Systems     Cardiac Risk Factors include: advanced age (>54mn, >>52women)     Objective:    Today's Vitals   12/09/20 0834  PainSc: 3    There is no height or weight on file to calculate BMI.  Advanced Directives 12/09/2020 03/24/2020 03/22/2020 12/04/2019 11/27/2019 09/25/2018  Does Patient Have a Medical Advance Directive? No No Yes Yes Yes Yes  Type of Advance Directive - - Living will [place];Out of facility DNR (pink MOST or yellow form) Living will;Healthcare Power of [place] (pink MOST or yellow form) [place]  Does patient want to make changes to medical advance directive? - No - Patient declined No - Patient declined No - Patient declined No - Patient declined No - Patient declined  Copy of [place] Chart? - - - No - copy requested No - copy requested No - copy requested  Would patient like information on creating a medical advance directive? No - Patient declined - - - No - Patient declined -    Current Medications (verified) Outpatient Encounter Medications as of 12/09/2020  Medication Sig   acetaminophen (TYLENOL) 325 MG tablet Take 2 tablets (650 mg total) by mouth  every 6 (six) hours as needed.   albuterol (VENTOLIN HFA) 108 (90 Base) MCG/ACT inhaler Inhale 1 puff into the lungs every 6 (six) hours as needed for wheezing or shortness of breath.   cholecalciferol (VITAMIN D) 25 MCG tablet Take 2 tablets (2,000 Units total) by mouth daily.   fexofenadine (ALLEGRA) 180 MG tablet Take 1 tablet (180 mg total) by mouth daily.   SYMBICORT 80-4.5 MCG/ACT inhaler TAKE 2 PUFFS BY MOUTH TWICE A [place]   [DISCONTINUED] SYMBICORT 80-4.5 MCG/ACT inhaler TAKE 2 PUFFS BY MOUTH TWICE A [place]   No facility-administered encounter medications on file as of 12/09/2020.    Allergies (verified) Patient has no known allergies.   History: Past Medical History:  Diagnosis Date   Allergy    seasonal   Asthma    Osteoporosis    Other hyperlipidemia    Pneumonia    Seasonal allergies    Shingles    Past Surgical History:  Procedure Laterality Date   ORIF TIBIA PLATEAU Right 03/16/2020   Procedure: OPEN REDUCTION INTERNAL FIXATION (ORIF) TIBIAL PLATEAU;  Surgeon: HShona Needles MD;  [place] [facility]  Service: Orthopedics;  Laterality: Right;   WISDOM TOOTH EXTRACTION     Family History  Problem Relation Age of Onset   Cancer - Colon Mother 770  Osteoporosis Mother    Bipolar disorder Mother    Mental illness Mother    Colon cancer Mother    Other Father  63       airplane accident   Anxiety disorder Daughter 13   Diabetes Mellitus II Paternal Grandfather    Esophageal cancer Neg Hx    Stomach cancer Neg Hx    Rectal cancer Neg Hx    Social History   Socioeconomic History   Marital status: Single    Spouse name: Not on file   Number of children: Not on file   Years of education: Not on file   Highest education level: Not on file  Occupational History   Not on file  Tobacco Use   Smoking status: Never   Smokeless tobacco: Never  Vaping Use   Vaping Use: Never used  Substance and Sexual Activity   Alcohol use: Yes    Alcohol/week: 5.0 standard drinks     Types: 5 Glasses of wine per week   Drug use: Never   Sexual activity: Not Currently  Other Topics Concern   Not on file  Social History Narrative   ** Merged History Encounter **       Social History  Diet? No beef or pork  Do you drink/eat things with caffeine? Tea  Marital status?                 married                   What year were you married? 77-94, 98 - current  Do you live in a house, apartment, assisted living, cond   o, trailer, etc.? apartment  Is it one or more stories? 4th  How many persons live in your home? 2  Do you have any pets in your home? (please list) no  Highest level of education completed? MDIV  Current or past profession: Therapist, music   or  Do you exercise?            some                           Type & how often? Walk, yoga 3 x wk  Advanced Directives  Do you have a living will? yes  Do you have a DNR form?          yes                        If not, do you want to discuss on   e?  Do you have signed POA/HPOA for forms? yes  Functional Status  Do you have difficulty bathing or dressing yourself? no  Do you have difficulty preparing food or eating? no  Do you have difficulty managing your medications? no  Do you have    difficulty managing your finances? No   Do you have difficulty affording your medications? No     Social Determinants of Radio broadcast assistant Strain: Not on file  Food Insecurity: Not on file  Transportation Needs: Not on file  Physical Activity: Not on file  Stress: Not on file  Social Connections: Not on file    Tobacco Counseling Counseling given: Not Answered   Clinical Intake:  Pre-visit preparation completed: No  Pain : 0-10 Pain Score: 3  Pain Type: Chronic pain Pain Location: Leg Pain Orientation: Right Pain Radiating Towards: no Pain Descriptors / Indicators: Aching Pain Onset: Other (comment) (december,2021) Pain Frequency: Constant Pain Relieving Factors:  tylenol Effect of Pain on Daily Activities: walking  Pain Relieving  Factors: tylenol  BMI - recorded: 16.55 Nutritional Status: BMI <19  Underweight Nutritional Risks: None Diabetes: No  How often do you need to have someone help you when you read instructions, pamphlets, or other written materials from your doctor or pharmacy?: 1 - Never What is the last grade level you completed in school?: Masters Degree  Diabetic?No   Interpreter Needed?: No  Information entered by :: Shacola Schussler,FNP-C   Activities of Daily Living In your present state of health, do you have any difficulty performing the following activities: 12/09/2020  Hearing? N  Vision? N  Difficulty concentrating or making decisions? N  Walking or climbing stairs? N  Dressing or bathing? N  Doing errands, shopping? N  Preparing Food and eating ? N  Using the Toilet? N  In the past six months, have you accidently leaked urine? N  Do you have problems with loss of bowel control? N  Managing your Medications? N  Managing your Finances? N  Housekeeping or managing your Housekeeping? N  Some recent data might be hidden    Patient Care Team: Keevan Wolz, Nelda Bucks, NP as PCP - General (Family Medicine) Lauree Chandler, NP (Geriatric Medicine)  Indicate any recent Medical Services you may have received from other than [facility] providers in the past year (date may be approximate).     Assessment:   This is a routine wellness examination for [place].  Hearing/Vision screen Hearing Screening - Comments:: No Hearing Concerns. Patient doesn't wear hearing aids. Vision Screening - Comments:: No Vision Concerns. Patient wears prescription glasses. Patient last eye exam was Feb 2022  Dietary issues and exercise activities discussed: Current Exercise Habits: Home exercise routine, Type of exercise: strength training/weights;walking, Time (Minutes): 30, Frequency (Times/Week): 3, Weekly Exercise (Minutes/Week): 90, Intensity:  Moderate, Exercise limited by: Other - see comments (right leg pain)   Goals Addressed             This Visit's Progress    Gain weight   Not on track    I want to gain some weight 110 lbs  2. Eat healthy diet to keep cholesterol         Depression Screen PHQ 2/9 Scores 12/09/2020 05/30/2020 12/04/2019 09/25/2018 09/25/2018 07/10/2017  PHQ - 2 Score 0 0 0 0 0 0    Fall Risk Fall Risk  12/09/2020 05/30/2020 12/04/2019 11/27/2019 12/18/2018  Falls in the past year? 0 0 0 0 0  Number falls in past yr: 0 0 0 0 0  Injury with Fall? 0 0 0 0 0  Risk for fall due to : No Fall Risks - - - -  Follow up Falls evaluation completed - - - -    FALL RISK PREVENTION PERTAINING TO THE HOME:  Any stairs in or around the home? Yes  If so, are there any without handrails? Yes  Home free of loose throw rugs in walkways, pet beds, electrical cords, etc? No  Adequate lighting in your home to reduce risk of falls? Yes   ASSISTIVE DEVICES UTILIZED TO PREVENT FALLS:  Life alert? No  Use of a cane, walker or w/c? No  Grab bars in the bathroom? No  Shower chair or bench in shower? No  Elevated toilet seat or a handicapped toilet? No   TIMED UP AND GO:  Was the test performed? No .  Length of time to ambulate 10 feet: N/A  sec.   Gait slow and steady without use of assistive device  Cognitive Function:  MMSE - Mini Mental State Exam 09/25/2018  Orientation to time 4  Orientation to Place 5  Registration 3  Attention/ Calculation 5  Recall 3  Language- name 2 objects 2  Language- repeat 1  Language- follow 3 step command 3  Language- read & follow direction 1  Write a sentence 1  Copy design 1  Total score 29     6CIT Screen 12/09/2020 12/04/2019  What Year? 0 points 0 points  What month? 0 points 0 points  What time? 0 points 0 points  Count back from 20 0 points 0 points  Months in reverse 0 points 0 points  Repeat phrase 0 points 0 points  Total Score 0 0    Immunizations Immunization  History  Administered Date(s) Administered   Influenza, High Dose Seasonal PF 02/13/2018, 01/06/2019   Influenza-Unspecified 12/31/2016   PFIZER(Purple Top)SARS-COV-2 Vaccination 04/29/2019, 05/20/2019, 01/04/2020, 07/20/2020   Pneumococcal Conjugate-13 01/17/2017   Pneumococcal Polysaccharide-23 04/09/2019   Tdap 03/14/2020    TDAP status: Up to date  Flu Vaccine status: Due, Education has been provided regarding the importance of this vaccine. Advised may receive this vaccine at local pharmacy or Health Dept. Aware to provide a copy of the vaccination record if obtained from local pharmacy or Health Dept. Verbalized acceptance and understanding.  Pneumococcal vaccine status: Up to date  Covid-19 vaccine status: Information provided on how to obtain vaccines.   Qualifies for Shingles Vaccine? Yes   Zostavax completed No   Shingrix Completed?: No.    Education has been provided regarding the importance of this vaccine. Patient has been advised to call insurance company to determine out of pocket expense if they have not yet received this vaccine. Advised may also receive vaccine at local pharmacy or Health Dept. Verbalized acceptance and understanding.  Screening Tests Health Maintenance  Topic Date Due   Hepatitis C Screening  Never done   Zoster Vaccines- Shingrix (1 of 2) Never done   MAMMOGRAM  08/16/2019   INFLUENZA VACCINE  10/31/2020   COLONOSCOPY (Pts 45-60yr Insurance coverage will need to be confirmed)  08/22/2022   TETANUS/TDAP  03/14/2030   DEXA SCAN  Completed   COVID-19 Vaccine  Completed   PNA vac Low Risk Adult  Completed   HPV VACCINES  Aged Out    Health Maintenance  Health Maintenance Due  Topic Date Due   Hepatitis C Screening  Never done   Zoster Vaccines- Shingrix (1 of 2) Never done   MAMMOGRAM  08/16/2019   INFLUENZA VACCINE  10/31/2020    Colorectal cancer screening: Type of screening: Colonoscopy. Completed 08/21/2017. Repeat every 5  years  Mammogram status: Ordered 12/09/2020. Pt provided with contact info and advised to call to schedule appt.   Bone Density status: Completed 08/15/2017. Results reflect: Bone density results: OSTEOPOROSIS. Repeat every declined  years.  Lung Cancer Screening: (Low Dose CT Chest recommended if Age 68-80years, 30 pack-year currently smoking OR have quit w/in 15years.) does not qualify.   Lung Cancer Screening Referral: No   Additional Screening:  Hepatitis C Screening: does not qualify; Completed No   Vision Screening: Recommended annual ophthalmology exams for early detection of glaucoma and other disorders of the eye. Is the patient up to date with their annual eye exam?  Yes  Who is the provider or what is the name of the office in which the patient attends annual eye exams? Could not remember Dr's name.  If pt is not established with a provider,  would they like to be referred to a provider to establish care? No .   Dental Screening: Recommended annual dental exams for proper oral hygiene  Community Resource Referral / Chronic Care Management: CRR required this visit?  No   CCM required this visit?  No      Plan:     I have personally reviewed and noted the following in the patient's chart:   Medical and social history Use of alcohol, tobacco or illicit drugs  Current medications and supplements including opioid prescriptions.  Functional ability and status Nutritional status Physical activity Advanced directives List of other physicians Hospitalizations, surgeries, and ER visits in previous 12 months Vitals Screenings to include cognitive, depression, and falls Referrals and appointments  In addition, I have reviewed and discussed with patient certain preventive protocols, quality metrics, and best practice recommendations. A written personalized care plan for preventive services as well as general preventive health recommendations were provided to patient.      Sandrea Hughs, NP   12/09/2020   Nurse Notes: she will get her COVID-19 vaccine,Shingrix and Influenza vaccine at her pharmacy.  - will schedule for fasting labs and routine follow up visit today.  - Declined Bone density previous showed Osteoporosis declined any further treatment.will increase dietary intake of calcium.

## 2020-12-09 NOTE — Patient Instructions (Signed)
Rebecca [name] , Thank you for taking time to come for your Medicare Wellness Visit. I appreciate your ongoing commitment to your health goals. Please review the following plan we discussed and let me know if I can assist you in the future.   Screening recommendations/referrals: Colonoscopy Up to date  Mammogram: Ordered today imaging center will call to schedule appointment  Bone Density: Declined  Recommended yearly ophthalmology/optometry visit for glaucoma screening and checkup Recommended yearly dental visit for hygiene and checkup  Vaccinations: Influenza vaccine : Please get vaccine at your pharmacy  Pneumococcal vaccine: 2 nd dose of Pneumococcal 23  Tdap vaccine: Up to date  Shingles vaccine: Please get vaccine at your pharmacy   Advanced directives: No   Conditions/risks identified: Advance age > 35 yrs  Next appointment: 1 year    Preventive Care 42 Years and Older, Female Preventive care refers to lifestyle choices and visits with your health care provider that can promote health and wellness. What does preventive care include? A yearly physical exam. This is also called an annual well check. Dental exams once or twice a year. Routine eye exams. Ask your health care provider how often you should have your eyes checked. Personal lifestyle choices, including: Daily care of your teeth and gums. Regular physical activity. Eating a healthy diet. Avoiding tobacco and drug use. Limiting alcohol use. Practicing safe sex. Taking low-dose aspirin every [place]. Taking vitamin and mineral supplements as recommended by your health care provider. What happens during an annual well check? The services and screenings done by your health care provider during your annual well check will depend on your age, overall health, lifestyle risk factors, and family history of disease. Counseling  Your health care provider may ask you questions about your: Alcohol use. Tobacco use. Drug  use. Emotional well-being. Home and relationship well-being. Sexual activity. Eating habits. History of falls. Memory and ability to understand (cognition). Work and work Statistician. Reproductive health. Screening  You may have the following tests or measurements: Height, weight, and BMI. Blood pressure. Lipid and cholesterol levels. These may be checked every 5 years, or more frequently if you are over 75 years old. Skin check. Lung cancer screening. You may have this screening every year starting at age 19 if you have a 30-pack-year history of smoking and currently smoke or have quit within the past 15 years. Fecal occult blood test (FOBT) of the stool. You may have this test every year starting at age 72. Flexible sigmoidoscopy or colonoscopy. You may have a sigmoidoscopy every 5 years or a colonoscopy every 10 years starting at age 1. Hepatitis C blood test. Hepatitis B blood test. Sexually transmitted disease (STD) testing. Diabetes screening. This is done by checking your blood sugar (glucose) after you have not eaten for a while (fasting). You may have this done every 1-3 years. Bone density scan. This is done to screen for osteoporosis. You may have this done starting at age 50. Mammogram. This may be done every 1-2 years. Talk to your health care provider about how often you should have regular mammograms. Talk with your health care provider about your test results, treatment options, and if necessary, the need for more tests. Vaccines  Your health care provider may recommend certain vaccines, such as: Influenza vaccine. This is recommended every year. Tetanus, diphtheria, and acellular pertussis (Tdap, Td) vaccine. You may need a Td booster every 10 years. Zoster vaccine. You may need this after age 23. Pneumococcal 13-valent conjugate (PCV13) vaccine. One  dose is recommended after age 42. Pneumococcal polysaccharide (PPSV23) vaccine. One dose is recommended after age  33. Talk to your health care provider about which screenings and vaccines you need and how often you need them. This information is not intended to replace advice given to you by your health care provider. Make sure you discuss any questions you have with your health care provider. Document Released: 04/15/2015 Document Revised: 12/07/2015 Document Reviewed: 01/18/2015 Elsevier Interactive Patient Education  2017 [employer] Prevention in the Home Falls can cause injuries. They can happen to people of all ages. There are many things you can do to make your home safe and to help prevent falls. What can I do on the outside of my home? Regularly fix the edges of walkways and driveways and fix any cracks. Remove anything that might make you trip as you walk through a door, such as a raised step or threshold. Trim any bushes or trees on the path to your home. Use bright outdoor lighting. Clear any walking paths of anything that might make someone trip, such as rocks or tools. Regularly check to see if handrails are loose or broken. Make sure that both sides of any steps have handrails. Any raised decks and porches should have guardrails on the edges. Have any leaves, snow, or ice cleared regularly. Use sand or salt on walking paths during winter. Clean up any spills in your garage right away. This includes oil or grease spills. What can I do in the bathroom? Use night lights. Install grab bars by the toilet and in the tub and shower. Do not use towel bars as grab bars. Use non-skid mats or decals in the tub or shower. If you need to sit down in the shower, use a plastic, non-slip stool. Keep the floor dry. Clean up any water that spills on the floor as soon as it happens. Remove soap buildup in the tub or shower regularly. Attach bath mats securely with double-sided non-slip rug tape. Do not have throw rugs and other things on the floor that can make you trip. What can I do in the  bedroom? Use night lights. Make sure that you have a light by your bed that is easy to reach. Do not use any sheets or blankets that are too big for your bed. They should not hang down onto the floor. Have a firm chair that has side arms. You can use this for support while you get dressed. Do not have throw rugs and other things on the floor that can make you trip. What can I do in the kitchen? Clean up any spills right away. Avoid walking on wet floors. Keep items that you use a lot in easy-to-reach places. If you need to reach something above you, use a strong step stool that has a grab bar. Keep electrical cords out of the way. Do not use floor polish or wax that makes floors slippery. If you must use wax, use non-skid floor wax. Do not have throw rugs and other things on the floor that can make you trip. What can I do with my stairs? Do not leave any items on the stairs. Make sure that there are handrails on both sides of the stairs and use them. Fix handrails that are broken or loose. Make sure that handrails are as long as the stairways. Check any carpeting to make sure that it is firmly attached to the stairs. Fix any carpet that is loose or worn.  Avoid having throw rugs at the top or bottom of the stairs. If you do have throw rugs, attach them to the floor with carpet tape. Make sure that you have a light switch at the top of the stairs and the bottom of the stairs. If you do not have them, ask someone to add them for you. What else can I do to help prevent falls? Wear shoes that: Do not have high heels. Have rubber bottoms. Are comfortable and fit you well. Are closed at the toe. Do not wear sandals. If you use a stepladder: Make sure that it is fully opened. Do not climb a closed stepladder. Make sure that both sides of the stepladder are locked into place. Ask someone to hold it for you, if possible. Clearly mark and make sure that you can see: Any grab bars or  handrails. First and last steps. Where the edge of each step is. Use tools that help you move around (mobility aids) if they are needed. These include: Canes. Walkers. Scooters. Crutches. Turn on the lights when you go into a dark area. Replace any light bulbs as soon as they burn out. Set up your furniture so you have a clear path. Avoid moving your furniture around. If any of your floors are uneven, fix them. If there are any pets around you, be aware of where they are. Review your medicines with your doctor. Some medicines can make you feel dizzy. This can increase your chance of falling. Ask your doctor what other things that you can do to help prevent falls. This information is not intended to replace advice given to you by your health care provider. Make sure you discuss any questions you have with your health care provider. Document Released: 01/13/2009 Document Revised: 08/25/2015 Document Reviewed: 04/23/2014 Elsevier Interactive Patient Education  2017 [employer].

## 2020-12-12 ENCOUNTER — Other Ambulatory Visit: Payer: Medicare Other

## 2020-12-12 ENCOUNTER — Other Ambulatory Visit: Payer: Self-pay

## 2020-12-12 ENCOUNTER — Other Ambulatory Visit: Payer: Self-pay | Admitting: Family

## 2020-12-12 DIAGNOSIS — Z681 Body mass index (BMI) 19 or less, adult: Secondary | ICD-10-CM

## 2020-12-12 DIAGNOSIS — E785 Hyperlipidemia, unspecified: Secondary | ICD-10-CM | POA: Diagnosis not present

## 2020-12-12 DIAGNOSIS — R636 Underweight: Secondary | ICD-10-CM

## 2020-12-12 DIAGNOSIS — J4520 Mild intermittent asthma, uncomplicated: Secondary | ICD-10-CM | POA: Diagnosis not present

## 2020-12-12 DIAGNOSIS — Z1159 Encounter for screening for other viral diseases: Secondary | ICD-10-CM | POA: Diagnosis not present

## 2020-12-13 LAB — CBC WITH DIFFERENTIAL/PLATELET
Absolute Monocytes: 725 cells/uL (ref 200–950)
Basophils Absolute: 50 cells/uL (ref 0–200)
Basophils Relative: 0.8 %
Eosinophils Absolute: 329 cells/uL (ref 15–500)
Eosinophils Relative: 5.3 %
HCT: 45.4 % — ABNORMAL HIGH (ref 35.0–45.0)
Hemoglobin: 14.9 g/dL (ref 11.7–15.5)
Lymphs Abs: 2554 cells/uL (ref 850–3900)
MCH: 30.2 pg (ref 27.0–33.0)
MCHC: 32.8 g/dL (ref 32.0–36.0)
MCV: 92.1 fL (ref 80.0–100.0)
MPV: 10.5 fL (ref 7.5–12.5)
Monocytes Relative: 11.7 %
Neutro Abs: 2542 cells/uL (ref 1500–7800)
Neutrophils Relative %: 41 %
Platelets: 290 10*3/uL (ref 140–400)
RBC: 4.93 10*6/uL (ref 3.80–5.10)
RDW: 11.9 % (ref 11.0–15.0)
Total Lymphocyte: 41.2 %
WBC: 6.2 10*3/uL (ref 3.8–10.8)

## 2020-12-13 LAB — COMPLETE METABOLIC PANEL WITH GFR
AG Ratio: 1.8 (calc) (ref 1.0–2.5)
ALT: 10 U/L (ref 6–29)
AST: 17 U/L (ref 10–35)
Albumin: 4.5 g/dL (ref 3.6–5.1)
Alkaline phosphatase (APISO): 67 U/L (ref 37–153)
BUN: 10 mg/dL (ref 7–25)
CO2: 29 mmol/L (ref 20–32)
Calcium: 9.6 mg/dL (ref 8.6–10.4)
Chloride: 101 mmol/L (ref 98–110)
Creat: 0.75 mg/dL (ref 0.50–1.05)
Globulin: 2.5 g/dL (calc) (ref 1.9–3.7)
Glucose, Bld: 85 mg/dL (ref 65–99)
Potassium: 4.4 mmol/L (ref 3.5–5.3)
Sodium: 136 mmol/L (ref 135–146)
Total Bilirubin: 0.7 mg/dL (ref 0.2–1.2)
Total Protein: 7 g/dL (ref 6.1–8.1)
eGFR: 87 mL/min/{1.73_m2} (ref >=60)

## 2020-12-13 LAB — LIPID PANEL
Cholesterol: 240 mg/dL — ABNORMAL HIGH (ref <200)
HDL: 72 mg/dL (ref >=50)
LDL Cholesterol (Calc): 149 mg/dL (calc) — ABNORMAL HIGH
Non-HDL Cholesterol (Calc): 168 mg/dL (calc) — ABNORMAL HIGH (ref <130)
Total CHOL/HDL Ratio: 3.3 (calc) (ref <5.0)
Triglycerides: 86 mg/dL (ref <150)

## 2020-12-13 LAB — HEPATITIS C ANTIBODY
Hepatitis C Ab: NONREACTIVE
SIGNAL TO CUT-OFF: 0.01 (ref <1.00)

## 2020-12-13 LAB — TSH: TSH: 2.42 mIU/L (ref 0.40–4.50)

## 2020-12-15 ENCOUNTER — Encounter: Payer: Self-pay | Admitting: Family

## 2020-12-15 ENCOUNTER — Ambulatory Visit (INDEPENDENT_AMBULATORY_CARE_PROVIDER_SITE_OTHER): Payer: Medicare Other | Admitting: Family

## 2020-12-15 ENCOUNTER — Other Ambulatory Visit: Payer: Self-pay

## 2020-12-15 VITALS — BP 100/68 | HR 91 | Temp 97.3°F | Resp 16 | Ht 64.0 in | Wt 98.2 lb

## 2020-12-15 DIAGNOSIS — E785 Hyperlipidemia, unspecified: Secondary | ICD-10-CM

## 2020-12-15 DIAGNOSIS — J4520 Mild intermittent asthma, uncomplicated: Secondary | ICD-10-CM | POA: Diagnosis not present

## 2020-12-15 DIAGNOSIS — Z23 Encounter for immunization: Secondary | ICD-10-CM

## 2020-12-15 DIAGNOSIS — Z681 Body mass index (BMI) 19 or less, adult: Secondary | ICD-10-CM | POA: Diagnosis not present

## 2020-12-15 DIAGNOSIS — M810 Age-related osteoporosis without current pathological fracture: Secondary | ICD-10-CM | POA: Diagnosis not present

## 2020-12-15 NOTE — Progress Notes (Signed)
Provider: [name] FNP-C   Athalene Kolle, Nelda Bucks, NP  Patient Care Team: Jaicob Dia, Nelda Bucks, NP as PCP - General (Family Medicine) Lauree Chandler, NP (Geriatric Medicine)  Extended Emergency Contact Information Primary Emergency Contact: [name] Address: [address] [place]          [place], [place] [address] [name] of [place] Phone: [phone] Mobile Phone: [phone] Relation: Spouse Secondary Emergency Contact: Chaisson,[name] Home Phone: [phone] Relation: None  Code Status:  Full Code  Goals of care: Advanced Directive information Advanced Directives 12/15/2020  Does Patient Have a Medical Advance Directive? No  Type of Advance Directive -  Does patient want to make changes to medical advance directive? -  Copy of [place] in Chart? -  Would patient like information on creating a medical advance directive? No - Patient declined     Chief Complaint  Patient presents with   Medical Management of Chronic Issues    7 month follow up/ Discuss Labs.   Health Maintenance    Discuss the need for mammogram.    Immunizations    Discuss the need for Influenza vaccine, and Shingrix vaccine.    HPI:  Pt is a 70 y.o. female seen today for 7 months follow up for medical management of chronic diseases.Has a medical history of Hyperlipidemia,Asthma,Osteoporosis,seasonal allergies among others.she denies any acute issues. Recent lab results reviewed and discussed today.All labs within normal range except cholesterol 240,LDL 149 slightly worst than previous level.States hereditary declines statin.eats a healthy diet and Walks 3 miles daily. Mammogram recently ordered 12/09/2020  Due for Flu vaccine today. Will think about Shingrix vaccine states did not get chicken pox when young but her brother had it.  Past Medical History:  Diagnosis Date   Allergy    seasonal   Asthma    Osteoporosis    Other hyperlipidemia    Pneumonia     Seasonal allergies    Shingles    Past Surgical History:  Procedure Laterality Date   ORIF TIBIA PLATEAU Right 03/16/2020   Procedure: OPEN REDUCTION INTERNAL FIXATION (ORIF) TIBIAL PLATEAU;  Surgeon: Shona Needles, MD;  Location: [facility];  Service: Orthopedics;  Laterality: Right;   WISDOM TOOTH EXTRACTION      No Known Allergies  Allergies as of 12/15/2020   No Known Allergies      Medication List        Accurate as of December 15, 2020  9:17 AM. If you have any questions, ask your nurse or doctor.          STOP taking these medications    Vitamin D3 25 MCG tablet Commonly known as: Vitamin D Stopped by: Sandrea Hughs, NP       TAKE these medications    acetaminophen 325 MG tablet Commonly known as: TYLENOL Take 2 tablets (650 mg total) by mouth every 6 (six) hours as needed.   albuterol 108 (90 Base) MCG/ACT inhaler Commonly known as: VENTOLIN HFA Inhale 1 puff into the lungs every 6 (six) hours as needed for wheezing or shortness of breath.   fexofenadine 180 MG tablet Commonly known as: ALLEGRA Take 1 tablet (180 mg total) by mouth daily.   Symbicort 80-4.5 MCG/ACT inhaler Generic drug: budesonide-formoterol TAKE 2 PUFFS BY MOUTH TWICE A Isa        Review of Systems  Constitutional:  Negative for appetite change, chills, fatigue, fever and unexpected weight change.  HENT:  Negative for congestion,  dental problem, ear discharge, ear pain, facial swelling, hearing loss, nosebleeds, postnasal drip, rhinorrhea, sinus pressure, sinus pain, sneezing, sore throat, tinnitus and trouble swallowing.   Eyes:  Positive for visual disturbance. Negative for pain, discharge, redness and itching.       Wears eye glasses has new glasses since February,2022   Respiratory:  Negative for cough, chest tightness, shortness of breath and wheezing.   Cardiovascular:  Negative for chest pain, palpitations and leg swelling.  Gastrointestinal:  Negative for abdominal  distention, abdominal pain, blood in stool, constipation, diarrhea, nausea and vomiting.  Endocrine: Negative for cold intolerance, heat intolerance, polydipsia, polyphagia and polyuria.  Genitourinary:  Negative for difficulty urinating, dysuria, flank pain, frequency and urgency.  Musculoskeletal:  Negative for arthralgias, back pain, gait problem, joint swelling, myalgias, neck pain and neck stiffness.  Skin:  Negative for color change, pallor, rash and wound.  Neurological:  Negative for dizziness, syncope, speech difficulty, weakness, light-headedness, numbness and headaches.  Hematological:  Does not bruise/bleed easily.  Psychiatric/Behavioral:  Negative for agitation, behavioral problems, confusion, hallucinations, self-injury, sleep disturbance and suicidal ideas. The patient is not nervous/anxious.    Immunization History  Administered Date(s) Administered   Influenza, High Dose Seasonal PF 02/13/2018, 01/06/2019   Influenza-Unspecified 12/31/2016   PFIZER(Purple Top)SARS-COV-2 Vaccination 04/29/2019, 05/20/2019, 01/04/2020, 07/20/2020   Pneumococcal Conjugate-13 01/17/2017   Pneumococcal Polysaccharide-23 04/09/2019   Tdap 03/14/2020   Pertinent  Health Maintenance Due  Topic Date Due   MAMMOGRAM  08/16/2019   INFLUENZA VACCINE  10/31/2020   COLONOSCOPY (Pts 45-76yr Insurance coverage will need to be confirmed)  08/22/2022   DEXA SCAN  Completed   PNA vac Low Risk Adult  Completed   Fall Risk  12/15/2020 12/09/2020 05/30/2020 12/04/2019 11/27/2019  Falls in the past year? 0 0 0 0 0  Number falls in past yr: 0 0 0 0 0  Injury with Fall? 0 0 0 0 0  Risk for fall due to : No Fall Risks No Fall Risks - - -  Follow up Falls evaluation completed Falls evaluation completed - - -   Functional Status Survey:    Vitals:   12/15/20 0910  BP: 100/68  Pulse: 91  Resp: 16  Temp: (!) 97.3 F (36.3 C)  SpO2: 96%  Weight: 98 lb 3.2 oz (44.5 kg)  Height: _0  (1.626 m)   Body mass  index is 16.86 kg/m. Physical Exam Vitals reviewed.  Constitutional:      General: She is not in acute distress.    Appearance: Normal appearance. She is underweight. She is not ill-appearing or diaphoretic.  HENT:     Head: Normocephalic.     Right Ear: Tympanic membrane, ear canal and external ear normal. There is no impacted cerumen.     Left Ear: Tympanic membrane, ear canal and external ear normal. There is no impacted cerumen.     Nose: Nose normal. No congestion or rhinorrhea.     Mouth/Throat:     Mouth: Mucous membranes are moist.     Pharynx: Oropharynx is clear. No oropharyngeal exudate or posterior oropharyngeal erythema.  Eyes:     General: No scleral icterus.       Right eye: No discharge.        Left eye: No discharge.     Conjunctiva/sclera: Conjunctivae normal.     Pupils: Pupils are equal, round, and reactive to light.     Comments: Corrective lens in place   Neck:  Vascular: No carotid bruit.  Cardiovascular:     Rate and Rhythm: Normal rate and regular rhythm.     Pulses: Normal pulses.     Heart sounds: Normal heart sounds. No murmur heard.   No friction rub. No gallop.  Pulmonary:     Effort: Pulmonary effort is normal. No respiratory distress.     Breath sounds: Normal breath sounds. No wheezing, rhonchi or rales.  Chest:     Chest wall: No tenderness.  Abdominal:     General: Bowel sounds are normal. There is no distension.     Palpations: Abdomen is soft. There is no mass.     Tenderness: There is no abdominal tenderness. There is no right CVA tenderness, left CVA tenderness, guarding or rebound.  Musculoskeletal:        General: No swelling or tenderness. Normal range of motion.     Cervical back: Normal range of motion. No rigidity or tenderness.     Right lower leg: No edema.     Left lower leg: No edema.  Lymphadenopathy:     Cervical: No cervical adenopathy.  Skin:    General: Skin is warm and dry.     Coloration: Skin is not pale.      Findings: No bruising, erythema, lesion or rash.  Neurological:     Mental Status: She is alert and oriented to person, place, and time.     Cranial Nerves: No cranial nerve deficit.     Sensory: No sensory deficit.     Motor: No weakness.     Coordination: Coordination normal.     Gait: Gait normal.  Psychiatric:        Mood and Affect: Mood normal.        Speech: Speech normal.        Behavior: Behavior normal.        Thought Content: Thought content normal.        Judgment: Judgment normal.    Labs reviewed: Recent Labs    03/19/20 0452 03/24/20 1537 12/12/20 0840  NA 138 134* 136  K 3.6 3.4* 4.4  CL 104 99 101  CO2 _0 GLUCOSE 83 93 85  BUN 6* 12 10  CREATININE 0.63 0.60 0.75  CALCIUM 7.9* 8.8* 9.6   Recent Labs    03/14/20 1658 12/12/20 0840  AST 25 17  ALT 15 10  ALKPHOS 70  --   BILITOT 0.7 0.7  PROT 6.6 7.0  ALBUMIN 3.9  --    Recent Labs    03/19/20 0452 03/24/20 1537 12/12/20 0840  WBC 7.9 16.5* 6.2  NEUTROABS  --  11.9* 2,542  HGB 9.7* 12.8 14.9  HCT 27.9* 37.1 45.4*  MCV 90.9 91.4 92.1  PLT 242 561* 290   Lab Results  Component Value Date   TSH 2.42 12/12/2020   No results found for: HGBA1C Lab Results  Component Value Date   CHOL 240 (H) 12/12/2020   HDL 72 12/12/2020   LDLCALC 149 (H) 12/12/2020   TRIG 86 12/12/2020   CHOLHDL 3.3 12/12/2020    Significant Diagnostic Results in last 30 days:  No results found.  Assessment/Plan 1. Need for influenza vaccination Afebrile Flut shot administered by CMA no acute reaction reported.  - Flu Vaccine QUAD High Dose(Fluad)  2. Hyperlipidemia, unspecified hyperlipidemia type LDL not at goal.decline statin thinks elevated due to genetics in her family.  - continue dietary modification and exercise  - Lipid panel; Future  3.  Mild intermittent asthma, unspecified whether complicated Symptoms well controlled - continue on Symbicort and Albuterol   - CBC with  Differential/Platelet; Future - CMP with eGFR([facility]); Future  4. Age-related osteoporosis without current pathological fracture Left femur Neck T-score -4.3 ( 08/15/2017)  5. Body mass index (BMI) 19.9 or less, adult BMI 16.86  Weight stable this visit. - TSH; Future  Family/ staff Communication: Reviewed plan of care with patient verbalized understanding   Labs/tests ordered:  - CBC with Differential/Platelet - CMP with eGFR([facility]) - TSH - Lipid panel  Next Appointment : one year for Annual Physical examination   Sandrea Hughs, NP

## 2020-12-17 DIAGNOSIS — R531 Weakness: Secondary | ICD-10-CM | POA: Diagnosis not present

## 2020-12-28 DIAGNOSIS — S5292XA Unspecified fracture of left forearm, initial encounter for closed fracture: Secondary | ICD-10-CM | POA: Diagnosis not present

## 2020-12-28 DIAGNOSIS — S52222A Displaced transverse fracture of shaft of left ulna, initial encounter for closed fracture: Secondary | ICD-10-CM | POA: Diagnosis not present

## 2020-12-28 DIAGNOSIS — M25532 Pain in left wrist: Secondary | ICD-10-CM | POA: Diagnosis not present

## 2020-12-28 DIAGNOSIS — S52322A Displaced transverse fracture of shaft of left radius, initial encounter for closed fracture: Secondary | ICD-10-CM | POA: Diagnosis not present

## 2020-12-28 DIAGNOSIS — S52502A Unspecified fracture of the lower end of left radius, initial encounter for closed fracture: Secondary | ICD-10-CM | POA: Diagnosis not present

## 2020-12-28 DIAGNOSIS — S52352A Displaced comminuted fracture of shaft of radius, left arm, initial encounter for closed fracture: Secondary | ICD-10-CM | POA: Diagnosis not present

## 2020-12-28 DIAGNOSIS — S52252A Displaced comminuted fracture of shaft of ulna, left arm, initial encounter for closed fracture: Secondary | ICD-10-CM | POA: Diagnosis not present

## 2020-12-28 DIAGNOSIS — S52202A Unspecified fracture of shaft of left ulna, initial encounter for closed fracture: Secondary | ICD-10-CM | POA: Diagnosis not present

## 2020-12-28 DIAGNOSIS — S52609A Unspecified fracture of lower end of unspecified ulna, initial encounter for closed fracture: Secondary | ICD-10-CM | POA: Diagnosis not present

## 2021-01-03 ENCOUNTER — Encounter (HOSPITAL_COMMUNITY): Payer: Self-pay | Admitting: Student

## 2021-01-03 ENCOUNTER — Other Ambulatory Visit: Payer: Self-pay

## 2021-01-03 ENCOUNTER — Ambulatory Visit: Payer: Self-pay | Admitting: Student

## 2021-01-03 DIAGNOSIS — S52532A Colles' fracture of left radius, initial encounter for closed fracture: Secondary | ICD-10-CM | POA: Diagnosis not present

## 2021-01-03 NOTE — H&P (Signed)
[facility] ([facility]) H&P  Patient ID: Rebecca [name] MRN: [id number] DOB/AGE: 07-18-1950 70 y.o.  Reason for surgery: Left distal radius fracture  HPI: Rebecca [name] is an 70 y.o. female presenting for surgery on left wrist.  Patient tripped while walking up onto the porch, landing on outstretched hand.  Had immediate pain and notable deformity to the left wrist.  Was seen in the emergency department and underwent closed reduction of the fracture and placement of splint.  Patient seen in [facility] clinic on 01/03/2021.  Fracture remains in appropriate alignment on AP view, but on lateral view there is still significant amount of dorsal angulation to the fracture.  Patient denies injury to any of her other extremities from her fall.  She denies any previous injury or surgery to the left upper extremity.  Past Medical History:  Diagnosis Date   Allergy    seasonal   Asthma    Osteoporosis    Other hyperlipidemia    Pneumonia    Seasonal allergies    Shingles     Past Surgical History:  Procedure Laterality Date   ORIF TIBIA PLATEAU Right 03/16/2020   Procedure: OPEN REDUCTION INTERNAL FIXATION (ORIF) TIBIAL PLATEAU;  Surgeon: Shona Needles, MD;  Location: [facility];  Service: Orthopedics;  Laterality: Right;   WISDOM TOOTH EXTRACTION      Family History  Problem Relation Age of Onset   Cancer - Colon Mother 19   Osteoporosis Mother    Bipolar disorder Mother    Mental illness Mother    Colon cancer Mother    Other Father 24       airplane accident   Anxiety disorder Daughter 73   Diabetes Mellitus II Paternal Grandfather    Esophageal cancer Neg Hx    Stomach cancer Neg Hx    Rectal cancer Neg Hx     Social History:  reports that she has never smoked. She has never used smokeless tobacco. She reports current alcohol use of about 5.0 standard drinks per week. She reports that she does not use drugs.  Allergies: No Known Allergies  Medications: I have reviewed the patient's  current medications. Prior to Admission:  No medications prior to admission.    ROS: Constitutional: No fever or chills Vision: No changes in vision ENT: No difficulty swallowing CV: No chest pain Pulm: No SOB or wheezing GI: No nausea or vomiting GU: No urgency or inability to hold urine Skin: No poor wound healing Neurologic: No numbness or tingling Psychiatric: No depression or anxiety Heme: No bruising Allergic: No reaction to medications or food   Exam: There were no vitals taken for this visit. General: No acute distress Orientation: Alert and oriented x4 Mood and Affect: Mood and affect appropriate, pleasant and cooperative Gait: Within normal limits Coordination and balance: Within normal limits  LUE: Sugar-tong splint in place.  Significant bruising and swelling into the fingers.  Nontender above splint.  Able to wiggle fingers a small amount but this is limited secondary to placement of splint.  Endorses sensation to light touch over the fingers.  Hand warm and well-perfused  RUE: Skin without lesions. No tenderness to palpation. Full painless ROM, full strength in each muscle group without evidence of instability.   Medical Decision Making: Data: Imaging: AP and lateral views of the left wrist show distal radius fracture with dorsal angulation.  Labs: No results found for this or any previous visit (from the past 168 hour(s)).   Assessment/Plan: 70  year old female with left distal radius fracture  Patient with significant injury to left upper extremity.  I have discussed operative versus nonoperative management of the fracture with the patient and her husband.  Without surgical fixation, we may have a difficult time maintaining appropriate positioning of the fracture even after performing a closed reduction.  I feel that surgical fixation will provide a more reliable outcome in this highly active patient.  Due to patient's activity level at baseline for that she  would benefit most from operative treatment which would involve open reduction internal fixation of the left distal radius.  She agrees to proceed with surgery.  All questions answered to patient and her husband satisfaction.  Consent will be obtained  Miranda Garber A. [facility] [phone] (office) [email or web address]

## 2021-01-03 NOTE — Anesthesia Preprocedure Evaluation (Addendum)
Anesthesia Evaluation  Patient identified by MRN, date of birth, ID band Patient awake    Reviewed: Allergy & Precautions, NPO status , Patient's Chart, lab work & pertinent test results  History of Anesthesia Complications Negative for: history of anesthetic complications  Airway Mallampati: II  TM Distance: >3 FB Neck ROM: Full    Dental no notable dental hx.    Pulmonary asthma ,    Pulmonary exam normal        Cardiovascular negative cardio ROS Normal cardiovascular exam     Neuro/Psych negative neurological ROS  negative psych ROS   GI/Hepatic negative GI ROS, Neg liver ROS,   Endo/Other  negative endocrine ROS  Renal/GU negative Renal ROS  negative genitourinary   Musculoskeletal Closed Colles' fracture of left radius   Abdominal   Peds  Hematology negative hematology ROS (+)   Anesthesia Other Findings Klemp of surgery medications reviewed with patient.  Reproductive/Obstetrics negative OB ROS                            Anesthesia Physical Anesthesia Plan  ASA: 2  Anesthesia Plan: MAC and Regional   Post-op Pain Management:    Induction:   PONV Risk Score and Plan: 2 and Treatment may vary due to age or medical condition, Propofol infusion, Ondansetron and Midazolam  Airway Management Planned: Natural Airway and Simple Face Mask  Additional Equipment: None  Intra-op Plan:   Post-operative Plan:   Informed Consent: I have reviewed the patients History and Physical, chart, labs and discussed the procedure including the risks, benefits and alternatives for the proposed anesthesia with the patient or authorized representative who has indicated his/her understanding and acceptance.       Plan Discussed with: CRNA  Anesthesia Plan Comments:        Anesthesia Quick Evaluation

## 2021-01-03 NOTE — Progress Notes (Signed)
PCP - [name], NP  Cardiologist - denies EKG -  Chest x-ray -  ECHO -  Cardiac Cath -  CPAP -   ERAS Protcol - n/a  COVID TEST- n/a  Anesthesia review: n/a  -------------  SDW INSTRUCTIONS:  Your procedure is scheduled on Wednesday 10/5. Please report to [facility] Main Entrance "A" at [email or web address] and check in at the Admitting office. Call this number if you have problems the morning of surgery: [phone]   Remember: Do not eat or drink after midnight the night before your surgery   Medications to take morning of surgery with a sip of water include: acetaminophen (TYLENOL)  albuterol (VENTOLIN HFA) 108 (90 Base)  --- Please bring all inhalers with you the [place] of surgery.  fexofenadine (ALLEGRA)  SYMBICORT  As of today, STOP taking any Aspirin (unless otherwise instructed by your surgeon), Aleve, Naproxen, Ibuprofen, Motrin, Advil, Goody's, BC's, all herbal medications, fish oil, and all vitamins.    The Morning of Surgery Do not wear jewelry, make-up or nail polish. Do not wear lotions, powders, or perfumes, or deodorant  Do not bring valuables to the hospital. [facility] is not responsible for any belongings or valuables.  If you are a smoker, DO NOT Smoke 24 hours prior to surgery  If you wear a CPAP at night please bring your mask the morning of surgery   Remember that you must have someone to transport you home after your surgery, and remain with you for 24 hours if you are discharged the same [place].  Please bring cases for contacts, glasses, hearing aids, dentures or bridgework because it cannot be worn into surgery.   Patients discharged the [place] of surgery will not be allowed to drive home.   Please shower the NIGHT BEFORE/MORNING OF SURGERY (use antibacterial soap like DIAL soap if possible). Wear comfortable clothes the morning of surgery. Oral Hygiene is also important to reduce your risk of infection.  Remember - BRUSH YOUR TEETH THE MORNING OF  SURGERY WITH YOUR REGULAR TOOTHPASTE  Patient denies shortness of breath, fever, cough and chest pain.

## 2021-01-04 ENCOUNTER — Ambulatory Visit (HOSPITAL_COMMUNITY): Payer: Medicare Other

## 2021-01-04 ENCOUNTER — Encounter (HOSPITAL_COMMUNITY)
Admission: RE | Disposition: A | Discharge status: Home / Self Care | Payer: Self-pay | Source: Home / Self Care | Attending: Student

## 2021-01-04 ENCOUNTER — Ambulatory Visit (HOSPITAL_COMMUNITY)
Admission: RE | Admit: 2021-01-04 | Discharge: 2021-01-04 | Disposition: A | Discharge status: Home / Self Care | Payer: Medicare Other | Attending: Student | Admitting: Student

## 2021-01-04 ENCOUNTER — Encounter (HOSPITAL_COMMUNITY): Payer: Self-pay | Admitting: Student

## 2021-01-04 ENCOUNTER — Other Ambulatory Visit: Payer: Self-pay

## 2021-01-04 DIAGNOSIS — Z419 Encounter for procedure for purposes other than remedying health state, unspecified: Secondary | ICD-10-CM

## 2021-01-04 DIAGNOSIS — Y9301 Activity, walking, marching and hiking: Secondary | ICD-10-CM | POA: Diagnosis not present

## 2021-01-04 DIAGNOSIS — S52552A Other extraarticular fracture of lower end of left radius, initial encounter for closed fracture: Secondary | ICD-10-CM | POA: Diagnosis not present

## 2021-01-04 DIAGNOSIS — S52692A Other fracture of lower end of left ulna, initial encounter for closed fracture: Secondary | ICD-10-CM | POA: Insufficient documentation

## 2021-01-04 DIAGNOSIS — E7849 Other hyperlipidemia: Secondary | ICD-10-CM | POA: Diagnosis not present

## 2021-01-04 DIAGNOSIS — T148XXA Other injury of unspecified body region, initial encounter: Secondary | ICD-10-CM

## 2021-01-04 DIAGNOSIS — W010XXA Fall on same level from slipping, tripping and stumbling without subsequent striking against object, initial encounter: Secondary | ICD-10-CM | POA: Insufficient documentation

## 2021-01-04 DIAGNOSIS — S52592D Other fractures of lower end of left radius, subsequent encounter for closed fracture with routine healing: Secondary | ICD-10-CM | POA: Diagnosis not present

## 2021-01-04 DIAGNOSIS — S52602A Unspecified fracture of lower end of left ulna, initial encounter for closed fracture: Secondary | ICD-10-CM | POA: Diagnosis not present

## 2021-01-04 DIAGNOSIS — S52532A Colles' fracture of left radius, initial encounter for closed fracture: Secondary | ICD-10-CM | POA: Insufficient documentation

## 2021-01-04 DIAGNOSIS — J4520 Mild intermittent asthma, uncomplicated: Secondary | ICD-10-CM | POA: Diagnosis not present

## 2021-01-04 HISTORY — PX: OPEN REDUCTION INTERNAL FIXATION (ORIF) DISTAL RADIAL FRACTURE: SHX5989

## 2021-01-04 SURGERY — OPEN REDUCTION INTERNAL FIXATION (ORIF) DISTAL RADIUS FRACTURE
Anesthesia: Monitor Anesthesia Care | Laterality: Left

## 2021-01-04 MED ORDER — PROPOFOL 500 MG/50ML IV EMUL
INTRAVENOUS | Status: DC | PRN
  Administered 2021-01-04: 50 ug/kg/min via INTRAVENOUS

## 2021-01-04 MED ORDER — FENTANYL CITRATE (PF) 250 MCG/5ML IJ SOLN
INTRAMUSCULAR | Status: AC
  Filled 2021-01-04: qty 5

## 2021-01-04 MED ORDER — BUPIVACAINE-EPINEPHRINE (PF) 0.5% -1:200000 IJ SOLN
INTRAMUSCULAR | Status: DC | PRN
  Administered 2021-01-04: 25 mL via PERINEURAL

## 2021-01-04 MED ORDER — CLONIDINE HCL (ANALGESIA) 100 MCG/ML EP SOLN
EPIDURAL | Status: DC | PRN
  Administered 2021-01-04: 50 ug

## 2021-01-04 MED ORDER — PROPOFOL 10 MG/ML IV BOLUS
INTRAVENOUS | Status: AC
  Filled 2021-01-04: qty 20

## 2021-01-04 MED ORDER — CEFAZOLIN SODIUM-DEXTROSE 2-4 GM/100ML-% IV SOLN
2.0000 g | INTRAVENOUS | Status: AC
  Administered 2021-01-04: 2 g via INTRAVENOUS
  Filled 2021-01-04: qty 100

## 2021-01-04 MED ORDER — MIDAZOLAM HCL 5 MG/5ML IJ SOLN
INTRAMUSCULAR | Status: DC | PRN
  Administered 2021-01-04: 2 mg via INTRAVENOUS

## 2021-01-04 MED ORDER — LACTATED RINGERS IV SOLN: INTRAVENOUS | Status: DC | PRN

## 2021-01-04 MED ORDER — FENTANYL CITRATE (PF) 100 MCG/2ML IJ SOLN: 25.0000 ug | INTRAMUSCULAR | Status: DC | PRN

## 2021-01-04 MED ORDER — ORAL CARE MOUTH RINSE: 15.0000 mL | Freq: Once | OROMUCOSAL | Status: AC

## 2021-01-04 MED ORDER — CHLORHEXIDINE GLUCONATE 0.12 % MT SOLN
15.0000 mL | Freq: Once | OROMUCOSAL | Status: AC
  Administered 2021-01-04: 15 mL via OROMUCOSAL
  Filled 2021-01-04: qty 15

## 2021-01-04 MED ORDER — FENTANYL CITRATE (PF) 250 MCG/5ML IJ SOLN
INTRAMUSCULAR | Status: DC | PRN
  Administered 2021-01-04: 50 ug via INTRAVENOUS

## 2021-01-04 MED ORDER — PHENYLEPHRINE HCL (PRESSORS) 10 MG/ML IV SOLN
INTRAVENOUS | Status: DC | PRN
  Administered 2021-01-04 (×3): 40 ug via INTRAVENOUS

## 2021-01-04 MED ORDER — MIDAZOLAM HCL 2 MG/2ML IJ SOLN
INTRAMUSCULAR | Status: AC
  Filled 2021-01-04: qty 2

## 2021-01-04 MED ORDER — ACETAMINOPHEN 500 MG PO TABS: 1000.0000 mg | ORAL_TABLET | Freq: Once | ORAL | Status: DC

## 2021-01-04 MED ORDER — LACTATED RINGERS IV SOLN: INTRAVENOUS | Status: DC

## 2021-01-04 MED ORDER — 0.9 % SODIUM CHLORIDE (POUR BTL) OPTIME
TOPICAL | Status: DC | PRN
  Administered 2021-01-04: 1000 mL

## 2021-01-04 MED ORDER — DEXAMETHASONE SODIUM PHOSPHATE 10 MG/ML IJ SOLN
INTRAMUSCULAR | Status: DC | PRN
  Administered 2021-01-04: 10 mg via INTRAVENOUS

## 2021-01-04 MED ORDER — VANCOMYCIN HCL 1000 MG IV SOLR
INTRAVENOUS | Status: AC
  Filled 2021-01-04: qty 20

## 2021-01-04 MED ORDER — OXYCODONE HCL 5 MG/5ML PO SOLN: 5.0000 mg | Freq: Once | ORAL | Status: DC | PRN

## 2021-01-04 MED ORDER — VANCOMYCIN HCL 1000 MG IV SOLR
INTRAVENOUS | Status: DC | PRN
  Administered 2021-01-04: 1000 mg via TOPICAL

## 2021-01-04 MED ORDER — OXYCODONE HCL 5 MG PO TABS: 5.0000 mg | ORAL_TABLET | Freq: Once | ORAL | Status: DC | PRN

## 2021-01-04 MED ORDER — ONDANSETRON HCL 4 MG/2ML IJ SOLN
INTRAMUSCULAR | Status: DC | PRN
  Administered 2021-01-04: 4 mg via INTRAVENOUS

## 2021-01-04 MED ORDER — LIDOCAINE 2% (20 MG/ML) 5 ML SYRINGE
INTRAMUSCULAR | Status: DC | PRN
  Administered 2021-01-04: 60 mg via INTRAVENOUS

## 2021-01-04 SURGICAL SUPPLY — 63 items
BAG COUNTER SPONGE SURGICOUNT (BAG) ×2 IMPLANT
BIT DRILL 2.2 SS TIBIAL (BIT) ×1 IMPLANT
BLADE CLIPPER SURG (BLADE) IMPLANT
BNDG ELASTIC 3X5.8 VLCR STR LF (GAUZE/BANDAGES/DRESSINGS) ×2 IMPLANT
BNDG ELASTIC 4X5.8 VLCR STR LF (GAUZE/BANDAGES/DRESSINGS) ×2 IMPLANT
BNDG ESMARK 4X9 LF (GAUZE/BANDAGES/DRESSINGS) ×2 IMPLANT
BNDG GAUZE ELAST 4 BULKY (GAUZE/BANDAGES/DRESSINGS) ×2 IMPLANT
CLSR STERI-STRIP ANTIMIC 1/2X4 (GAUZE/BANDAGES/DRESSINGS) ×1 IMPLANT
COVER SURGICAL LIGHT HANDLE (MISCELLANEOUS) ×2 IMPLANT
CUFF TOURN SGL QUICK 18X4 (TOURNIQUET CUFF) IMPLANT
CUFF TOURN SGL QUICK 24 (TOURNIQUET CUFF)
CUFF TRNQT CYL 24X4X16.5-23 (TOURNIQUET CUFF) IMPLANT
DECANTER SPIKE VIAL GLASS SM (MISCELLANEOUS) ×2 IMPLANT
DRAPE C-ARM 35X43 STRL (DRAPES) IMPLANT
DRAPE U-SHAPE 47X51 STRL (DRAPES) ×2 IMPLANT
GAUZE SPONGE 4X4 12PLY STRL (GAUZE/BANDAGES/DRESSINGS) ×2 IMPLANT
GAUZE SPONGE 4X4 12PLY STRL LF (GAUZE/BANDAGES/DRESSINGS) ×1 IMPLANT
GAUZE XEROFORM 1X8 LF (GAUZE/BANDAGES/DRESSINGS) ×2 IMPLANT
GLOVE SURG ENC MOIS LTX SZ6.5 (GLOVE) ×2 IMPLANT
GLOVE SURG ENC MOIS LTX SZ7.5 (GLOVE) ×2 IMPLANT
GLOVE SURG UNDER POLY LF SZ7 (GLOVE) ×2 IMPLANT
GLOVE SURG UNDER POLY LF SZ7.5 (GLOVE) ×2 IMPLANT
GOWN STRL REUS W/ TWL LRG LVL3 (GOWN DISPOSABLE) ×1 IMPLANT
GOWN STRL REUS W/TWL LRG LVL3 (GOWN DISPOSABLE) ×1
K-WIRE 1.6 (WIRE) ×4
K-WIRE FX5X1.6XNS BN SS (WIRE) ×4
KIT BASIN OR (CUSTOM PROCEDURE TRAY) ×2 IMPLANT
KIT TURNOVER KIT B (KITS) ×2 IMPLANT
KWIRE FX5X1.6XNS BN SS (WIRE) IMPLANT
MANIFOLD NEPTUNE II (INSTRUMENTS) ×2 IMPLANT
NEEDLE 22X1 1/2 (OR ONLY) (NEEDLE) IMPLANT
NS IRRIG 1000ML POUR BTL (IV SOLUTION) ×2 IMPLANT
PACK ORTHO EXTREMITY (CUSTOM PROCEDURE TRAY) ×2 IMPLANT
PAD ARMBOARD 7.5X6 YLW CONV (MISCELLANEOUS) ×4 IMPLANT
PAD CAST 4YDX4 CTTN HI CHSV (CAST SUPPLIES) ×1 IMPLANT
PADDING CAST ABS 3INX4YD NS (CAST SUPPLIES) ×1
PADDING CAST ABS COTTON 3X4 (CAST SUPPLIES) IMPLANT
PADDING CAST COTTON 4X4 STRL (CAST SUPPLIES) ×1
PLATE NARROW DVR LEFT (Plate) ×1 IMPLANT
SCREW 2.7X14MM (Screw) ×2 IMPLANT
SCREW BN 14X2.7XNONLOCK 3 LD (Screw) IMPLANT
SCREW LOCK 14X2.7X 3 LD TPR (Screw) IMPLANT
SCREW LOCK 16X2.7X 3 LD TPR (Screw) IMPLANT
SCREW LOCK 18X2.7X 3 LD TPR (Screw) IMPLANT
SCREW LOCK 20X2.7X 3 LD TPR (Screw) IMPLANT
SCREW LOCK 22X2.7X 3 LD TPR (Screw) IMPLANT
SCREW LOCKING 2.7X13MM (Screw) ×1 IMPLANT
SCREW LOCKING 2.7X14 (Screw) ×1 IMPLANT
SCREW LOCKING 2.7X16 (Screw) ×1 IMPLANT
SCREW LOCKING 2.7X18 (Screw) ×1 IMPLANT
SCREW LOCKING 2.7X20MM (Screw) ×1 IMPLANT
SCREW LOCKING 2.7X22MM (Screw) ×1 IMPLANT
SCREW MULTI DIRECTIONAL 2.7X16 (Screw) ×1 IMPLANT
SCREW MULTI DIRECTIONAL 2.7X20 (Screw) ×1 IMPLANT
SCREW MULTI DIRECTIONAL 2.7X22 (Screw) ×1 IMPLANT
SPLINT PLASTER EXTRA FAST 3X15 (CAST SUPPLIES) ×1
SPLINT PLASTER GYPS XFAST 3X15 (CAST SUPPLIES) IMPLANT
SYR CONTROL 10ML LL (SYRINGE) ×2 IMPLANT
TOWEL GREEN STERILE (TOWEL DISPOSABLE) ×2 IMPLANT
TOWEL GREEN STERILE FF (TOWEL DISPOSABLE) ×2 IMPLANT
TUBE CONNECTING 12X1/4 (SUCTIONS) ×2 IMPLANT
WATER STERILE IRR 1000ML POUR (IV SOLUTION) ×2 IMPLANT
YANKAUER SUCT BULB TIP NO VENT (SUCTIONS) IMPLANT

## 2021-01-04 NOTE — Discharge Instructions (Addendum)
Katha Hamming, MD Patrecia Pace PA-C [facility] [place] [phone] Asher [phone] (fax)                                  POST-OPERATIVE INSTRUCTIONS   WEIGHT BEARING STATUS: non-weightbearing through left wrist, ok to weightbear through the elbow  RANGE OF MOTION/ACTIVITY: Ok for elbow range of motion. Do no remove splint from left wrist  WOUND CARE Please keep splint clean dry and intact until follow-up. If your splint gets wet for any reason please contact the office immediately.  Do not stick anything down your splint such as pencils, momey, hangers to try and scratch yourself.  If you feel itchy take Benadryl as prescribed on the bottle for itching You may shower on Post-Op Rester #2.  You must keep splint dry during this process and may find that a plastic bag taped around the extremity or alternatively a towel based bath may be a better option.   If you get your splint wet or if it is damaged please contact our clinic.  EXERCISES Due to your splint being in place you will not be able to bear weight through your extremity.   Please use your sling until follow-up. Please continue to work on range of motion of your fingers and stretch these multiple times a Sheppard to prevent stiffness. Please continue to ambulate and do not stay sitting or lying for too long. Perform foot and wrist pumps to assist in circulation.  DVT/PE prophylaxis: None  DIET: As you were eating previously.  Can use over the counter stool softeners and bowel preparations, such as Miralax, to help with bowel movements.  Narcotics can be constipating.  Be sure to drink plenty of fluids  REGIONAL ANESTHESIA (NERVE BLOCKS) The anesthesia team may have performed a nerve block for you if safe in the setting of your care.  This is a great tool used to minimize pain.  Typically the block may start wearing off overnight but the long acting medicine may last for 3-4 days.  The nerve block  wearing off can be a challenging period but please utilize your as needed pain medications to try and manage this period.    ICE AND ELEVATE INJURED/OPERATIVE EXTREMITY Using ice and elevating the injured extremity above your heart can help with swelling and pain control.  Icing in a pulsatile fashion, such as 20 minutes on and 20 minutes off, can be followed.   Do not place ice directly on skin. Make sure there is a barrier between to skin and the ice pack.   Using frozen items such as frozen peas works well as the conform nicely to the are that needs to be iced.  POST-OP MEDICATIONS- Multimodal approach to pain control In general your pain will be controlled with a combination of substances.  Prescriptions unless otherwise discussed are electronically sent to your pharmacy.  This is a carefully made plan we use to minimize narcotic use.                     -   Acetaminophen - Non-narcotic pain medicine taken on a scheduled basis        -   Oxycodone - This is a strong narcotic, to be used only on an "as needed" basis for pain.                  -  Robaxin -  Muscle relaxer taken on an "as needed" basis for muscle spasms                  -   Gabapentin - Helps with nerve pain, such as burning or tingling feeling in the operative arm             STOP SMOKING OR USING NICOTINE PRODUCTS!!!!  As discussed nicotine severely impairs your body's ability to heal surgical and traumatic wounds but also impairs bone healing.  Wounds and bone heal by forming microscopic blood vessels (angiogenesis) and nicotine is a vasoconstrictor (essentially, shrinks blood vessels).  Therefore, if vasoconstriction occurs to these microscopic blood vessels they essentially disappear and are unable to deliver necessary nutrients to the healing tissue.  This is one modifiable factor that you can do to dramatically increase your chances of healing your injury.    (This means no smoking, no nicotine gum, patches,  etc)   FOLLOW-UP If you develop a Fever (>101.5), Redness or Drainage from the surgical incision site, please call our office to arrange for an evaluation. Please call the office to schedule a follow-up appointment for your incision check if you do not already have one, 7-10 days post-operatively.  CALL THE OFFICE WITH ANY QUESTIONS OR CONCERNS: [phone]   VISIT OUR WEBSITE FOR ADDITIONAL INFORMATION: [email or web address]    OTHER HELPFUL INFORMATION  If you had a block, it will wear off between 8-24 hrs postop typically.  This is period when your pain may go from nearly zero to the pain you would have had postop without the block.  This is an abrupt transition but nothing dangerous is happening.  You may take an extra dose of narcotic when this happens.  You may be more comfortable sleeping in a semi-seated position the first few nights following surgery.  Keep a pillow propped under the elbow and forearm for comfort.  If you have a recliner type of chair it might be beneficial.  If not that is fine too, but it would be helpful to sleep propped up with pillows behind your operated shoulder as well under your elbow and forearm.  This will reduce pulling on the suture lines.  When dressing, put your operative arm in the sleeve first.  When getting undressed, take your operative arm out last.  Loose fitting, button-down shirts are recommended.  Often in the first days after surgery you may be more comfortable keeping your operative arm under your shirt and not through the sleeve.  You may return to work/school in the next couple of days when you feel up to it.  Desk work and typing in the sling is fine.  We suggest you use the pain medication the first night prior to going to bed, in order to ease any pain when the anesthesia wears off. You should avoid taking pain medications on an empty stomach as it will make you nauseous. You should wean off your narcotic medicines as soon as you are  able.  Most patients will be off or using minimal narcotics before their first postop appointment.   Do not drink alcoholic beverages or take illicit drugs when taking pain medications.  It is against the law to drive while taking narcotics.  In some states it is against the law to drive while your arm is in a sling.   Pain medication may make you constipated.  Below are a few solutions to try in this order: Decrease the amount of pain  medication if you aren't having pain. Drink lots of decaffeinated fluids. Drink prune juice and/or each dried prunes  If the first 3 don't work start with additional solutions -   Take Colace - an over-the-counter stool softener -   Take Senokot - an over-the-counter laxative -   Take Miralax - a stronger over-the-counter laxative

## 2021-01-04 NOTE — Anesthesia Postprocedure Evaluation (Signed)
Anesthesia Post Note  Patient: Rebecca [name]  Procedure(s) Performed: OPEN REDUCTION INTERNAL FIXATION (ORIF) DISTAL RADIAL FRACTURE (Left)     Patient location during evaluation: PACU Anesthesia Type: Regional and MAC Level of consciousness: awake and alert and oriented Pain management: pain level controlled Vital Signs Assessment: post-procedure vital signs reviewed and stable Respiratory status: spontaneous breathing, nonlabored ventilation and respiratory function stable Cardiovascular status: blood pressure returned to baseline Postop Assessment: no apparent nausea or vomiting Anesthetic complications: no   No notable events documented.  Last Vitals:  Vitals:   01/04/21 0955 01/04/21 1021  BP: (!) 104/53 96/80  Pulse: 74 63  Resp: 12 11  Temp: 36.6 C 36.6 C  SpO2: 100% 98%    Last Pain:  Vitals:   01/04/21 1021  TempSrc:   PainSc: 0-No pain                 Marthenia Rolling

## 2021-01-04 NOTE — Transfer of Care (Signed)
Immediate Anesthesia Transfer of Care Note  Patient: Rebecca [name]  Procedure(s) Performed: OPEN REDUCTION INTERNAL FIXATION (ORIF) DISTAL RADIAL FRACTURE (Left)  Patient Location: PACU  Anesthesia Type:MAC combined with regional for post-op pain  Level of Consciousness: awake, alert , oriented and patient cooperative  Airway & Oxygen Therapy: Patient Spontanous Breathing  Post-op Assessment: Report given to RN, Post -op Vital signs reviewed and stable and Patient moving all extremities X 4  Post vital signs: Reviewed and stable  Last Vitals:  Vitals Value Taken Time  BP 104/53 01/04/21 0955  Temp    Pulse 70 01/04/21 0957  Resp 11 01/04/21 0957  SpO2 100 % 01/04/21 0957  Vitals shown include unvalidated device data.  Last Pain:  Vitals:   01/04/21 0715  TempSrc:   PainSc: 1          Complications: No notable events documented.

## 2021-01-04 NOTE — Interval H&P Note (Signed)
History and Physical Interval Note:  01/04/2021 8:20 AM  Rebecca [name]  has presented today for surgery, with the diagnosis of Left distal radius fracture.  The various methods of treatment have been discussed with the patient and family. After consideration of risks, benefits and other options for treatment, the patient has consented to  Procedure(s): OPEN REDUCTION INTERNAL FIXATION (ORIF) DISTAL RADIAL FRACTURE (Left) as a surgical intervention.  The patient's history has been reviewed, patient examined, no change in status, stable for surgery.  I have reviewed the patient's chart and labs.  Questions were answered to the patient's satisfaction.     Lennette Bihari P Aleyssa Pike

## 2021-01-04 NOTE — Op Note (Signed)
Orthopaedic Surgery Operative Note (CSN: [id number] ) Date of Surgery: 01/04/2021  Admit Date: 01/04/2021   Diagnoses: Pre-Op Diagnoses: Left extra-articular distal radius and distal ulna fracture  Post-Op Diagnosis: Same  Procedures: CPT 25607-Open reduction internal fixation of left distal radius  Surgeons : Primary: Shona Needles, MD  Assistant: Patrecia Pace, PA-C  Location: OR 3   Anesthesia:Regional with MAC   Antibiotics: Ancef 2g preop with 1 gm vancomycin powder placed topically   Tourniquet time: Total Tourniquet Time Documented: Upper Arm (Left) - 47 minutes Total: Upper Arm (Left) - 47 minutes  Estimated Blood Loss:5 mL  Complications:None  Specimens:None   Implants: Implant Name Type Inv. Item Serial No. Manufacturer Lot No. LRB No. Used Action  PLATE NARROW DVR LEFT - [id number] Plate PLATE NARROW DVR LEFT  [employer](ORTH,TRAU,BIO,SG)  Left 1 Implanted  SCREW 2.7X14MM - [id number] Screw SCREW 2.7X14MM  [employer](ORTH,TRAU,BIO,SG)  Left 2 Implanted  SCREW MULTI DIRECTIONAL 2.7X20 - [id number] Screw SCREW MULTI DIRECTIONAL 2.7X20  [employer](ORTH,TRAU,BIO,SG)  Left 1 Implanted  SCREW MULTI DIRECTIONAL 2.7X22 - [id number] Screw SCREW MULTI DIRECTIONAL 2.7X22  [employer](ORTH,TRAU,BIO,SG)  Left 1 Implanted  SCREW LOCKING 2.7X22MM - [id number] Screw SCREW LOCKING 2.7X22MM  [employer](ORTH,TRAU,BIO,SG)  Left 1 Implanted  SCREW LOCKING 2.7X22MM - [id number] Screw SCREW LOCKING 2.7X22MM  [employer](ORTH,TRAU,BIO,SG)  Left 1 Implanted  SCREW LOCKING 2.7X18 - [id number] Screw SCREW LOCKING 2.7X18  [employer](ORTH,TRAU,BIO,SG)  Left 1 Implanted  SCREW LOCKING 2.7X14 - [id number] Screw SCREW LOCKING 2.7X14  [employer](ORTH,TRAU,BIO,SG)  Left 1 Implanted  SCREW LOCKING 2.7X13MM - [id number] Screw SCREW LOCKING 2.7X13MM  [employer](ORTH,TRAU,BIO,SG)  Left 1 Implanted  SCREW MULTI DIRECTIONAL 2.7X16 - [id number] Screw SCREW MULTI DIRECTIONAL 2.7X16  [employer]IMMER  RECON(ORTH,TRAU,BIO,SG)  Left 1 Implanted     Indications for Surgery: 70 year old female right-hand-dominant that fell landing on her left wrist sustained a extra-articular distal radius and ulna fracture.  Patient presented to me in the office where I went over the risks and benefits of surgical intervention.  Risks included but not limited to bleeding, infection, malunion, nonunion, hardware failure, hardware irritation, nerve or blood vessel injury, DVT, even the possibility of complications.  After full discussion of nonoperative and operative treatment options she wishes to proceed with surgery and consent was obtained.  Operative Findings: Open reduction internal fixation of left distal radius plate using [employer] DVR volar distal radius locking plate. No fixation to distal ulna.  Procedure: The patient was identified in the preoperative holding area. Consent was confirmed with the patient and their family and all questions were answered. The operative extremity was marked after confirmation with the patient. he was then brought back to the operating room by our anesthesia colleagues. The patient was carefully transferred over to regular OR table.  They were placed under sedation.  A nonsterile tourniquet was placed to the upper arm.The operative extremity was then prepped and draped in usual sterile fashion. A preoperative timeout was performed to verify the patient, the procedure, and the extremity. Preoperative antibiotics were dosed.  Fluoroscopy images were used to visualize the fracture.  The tourniquet was inflated to 250 mmHg.  A standard FCR approach was performed.  The skin and subcutaneous tissue and the fascia overlying the FCR was incised.  The FCR tendon was moved out of the way in the dorsal sheath was incised as well.  The finger flexors were swept out of the way and the pronator quadratus was visualized.  This was taken down from the  radial border using a 15 blade and  periosteal elevator.  A reduction maneuver was performed to improve the alignment of the distal articular segment.  Percutaneous K wires were placed in the radial styloid and directed proximal and ulnar to gain fixation into the intact shaft.  When the distal radius was provisionally fixed with a K wire a volar plate was appropriately positioned in the AP and lateral fluoroscopic imaging.  It was pinned in place and a nonlocking screw was placed in the radial shaft.  I then proceeded to place the distal fixation with locking screws.  I used fluoroscopy to confirm that they were extra-articular.  A total of 6 locking screws were placed into the distal segment.  3 more screw were placed in the shaft including two locking screws.  This completed the fixation construct.  Final fluoroscopic images were obtained.  The incision was thoroughly irrigated.  A gram of vancomycin powder was placed into the wound.  The quadratus was left unrepaired.  The skin was then closed with 2-0 Vicryl and 3-0 monocryl suture.  A sterile dressing consisting of Adaptic, 4 x 4's, sterile cast padding volar wrist splint was placed.  Patient was then awoken from anesthesia and taken to the PACU in stable condition.   Post Op Plan/Instructions: Patient will be nonweightbearing to the left upper extremity.  She will be discharged home from the PACU.  No DVT prophylaxis is needed in this healthy upper extremity patient.  I was present and performed the entire surgery.  Patrecia Pace, PA-C did assist me throughout the case. An assistant was necessary given the difficulty in approach, maintenance of reduction and ability to instrument the fracture.   Katha Hamming, MD [facility]

## 2021-01-04 NOTE — Anesthesia Procedure Notes (Addendum)
Anesthesia Regional Block: Supraclavicular block   Pre-Anesthetic Checklist: , timeout performed,  Correct Patient, Correct Site, Correct Laterality,  Correct Procedure, Correct Position, site marked,  Risks and benefits discussed,  Pre-op evaluation,  At surgeon's request and post-op pain management  Laterality: Left  Prep: Maximum Sterile Barrier Precautions used, chloraprep       Needles:  Injection technique: Single-shot  Needle Type: Echogenic Stimulator Needle     Needle Length: 9cm  Needle Gauge: 22     Additional Needles:   Procedures:,,,, ultrasound used (permanent image in chart),,    Narrative:  Start time: 01/04/2021 8:07 AM End time: 01/04/2021 8:11 AM Injection made incrementally with aspirations every 5 mL.  Performed by: Personally  Anesthesiologist: Brennan Bailey, MD  Additional Notes: Risks, benefits, and alternative discussed. Patient gave consent for procedure. Patient prepped and draped in sterile fashion. Sedation administered, patient remains easily responsive to voice. Relevant anatomy identified with ultrasound guidance. Local anesthetic given in 5cc increments with no signs or symptoms of intravascular injection. No pain or paraesthesias with injection. Patient monitored throughout procedure with signs of LAST or immediate complications. Tolerated well. Ultrasound image placed in chart.  Tawny Asal, MD

## 2021-01-05 ENCOUNTER — Encounter (HOSPITAL_COMMUNITY): Payer: Self-pay | Admitting: Student

## 2021-01-17 DIAGNOSIS — S52532D Colles' fracture of left radius, subsequent encounter for closed fracture with routine healing: Secondary | ICD-10-CM | POA: Diagnosis not present

## 2021-01-27 DIAGNOSIS — M25642 Stiffness of left hand, not elsewhere classified: Secondary | ICD-10-CM | POA: Diagnosis not present

## 2021-01-27 DIAGNOSIS — M25632 Stiffness of left wrist, not elsewhere classified: Secondary | ICD-10-CM | POA: Diagnosis not present

## 2021-01-27 DIAGNOSIS — M25532 Pain in left wrist: Secondary | ICD-10-CM | POA: Diagnosis not present

## 2021-01-31 DIAGNOSIS — M25642 Stiffness of left hand, not elsewhere classified: Secondary | ICD-10-CM | POA: Diagnosis not present

## 2021-01-31 DIAGNOSIS — M25532 Pain in left wrist: Secondary | ICD-10-CM | POA: Diagnosis not present

## 2021-01-31 DIAGNOSIS — M25632 Stiffness of left wrist, not elsewhere classified: Secondary | ICD-10-CM | POA: Diagnosis not present

## 2021-02-02 DIAGNOSIS — M25532 Pain in left wrist: Secondary | ICD-10-CM | POA: Diagnosis not present

## 2021-02-02 DIAGNOSIS — M25642 Stiffness of left hand, not elsewhere classified: Secondary | ICD-10-CM | POA: Diagnosis not present

## 2021-02-02 DIAGNOSIS — M25632 Stiffness of left wrist, not elsewhere classified: Secondary | ICD-10-CM | POA: Diagnosis not present

## 2021-02-07 DIAGNOSIS — M25632 Stiffness of left wrist, not elsewhere classified: Secondary | ICD-10-CM | POA: Diagnosis not present

## 2021-02-07 DIAGNOSIS — M25642 Stiffness of left hand, not elsewhere classified: Secondary | ICD-10-CM | POA: Diagnosis not present

## 2021-02-07 DIAGNOSIS — M25532 Pain in left wrist: Secondary | ICD-10-CM | POA: Diagnosis not present

## 2021-02-07 DIAGNOSIS — S52532D Colles' fracture of left radius, subsequent encounter for closed fracture with routine healing: Secondary | ICD-10-CM | POA: Diagnosis not present

## 2021-02-14 DIAGNOSIS — M25532 Pain in left wrist: Secondary | ICD-10-CM | POA: Diagnosis not present

## 2021-02-14 DIAGNOSIS — M25632 Stiffness of left wrist, not elsewhere classified: Secondary | ICD-10-CM | POA: Diagnosis not present

## 2021-02-14 DIAGNOSIS — M25642 Stiffness of left hand, not elsewhere classified: Secondary | ICD-10-CM | POA: Diagnosis not present

## 2021-02-15 ENCOUNTER — Ambulatory Visit
Admission: RE | Admit: 2021-02-15 | Discharge: 2021-02-15 | Disposition: A | Discharge status: Home / Self Care | Payer: Medicare Other | Source: Ambulatory Visit | Attending: Family | Admitting: Family

## 2021-02-15 ENCOUNTER — Other Ambulatory Visit: Payer: Self-pay

## 2021-02-15 DIAGNOSIS — Z1231 Encounter for screening mammogram for malignant neoplasm of breast: Secondary | ICD-10-CM

## 2021-02-21 DIAGNOSIS — M25642 Stiffness of left hand, not elsewhere classified: Secondary | ICD-10-CM | POA: Diagnosis not present

## 2021-02-21 DIAGNOSIS — M25632 Stiffness of left wrist, not elsewhere classified: Secondary | ICD-10-CM | POA: Diagnosis not present

## 2021-02-21 DIAGNOSIS — M25532 Pain in left wrist: Secondary | ICD-10-CM | POA: Diagnosis not present

## 2021-02-28 DIAGNOSIS — M25642 Stiffness of left hand, not elsewhere classified: Secondary | ICD-10-CM | POA: Diagnosis not present

## 2021-02-28 DIAGNOSIS — M25632 Stiffness of left wrist, not elsewhere classified: Secondary | ICD-10-CM | POA: Diagnosis not present

## 2021-02-28 DIAGNOSIS — M25532 Pain in left wrist: Secondary | ICD-10-CM | POA: Diagnosis not present

## 2021-03-07 DIAGNOSIS — M25642 Stiffness of left hand, not elsewhere classified: Secondary | ICD-10-CM | POA: Diagnosis not present

## 2021-03-07 DIAGNOSIS — S52532D Colles' fracture of left radius, subsequent encounter for closed fracture with routine healing: Secondary | ICD-10-CM | POA: Diagnosis not present

## 2021-03-07 DIAGNOSIS — M25532 Pain in left wrist: Secondary | ICD-10-CM | POA: Diagnosis not present

## 2021-03-07 DIAGNOSIS — M25632 Stiffness of left wrist, not elsewhere classified: Secondary | ICD-10-CM | POA: Diagnosis not present

## 2021-03-14 DIAGNOSIS — M25632 Stiffness of left wrist, not elsewhere classified: Secondary | ICD-10-CM | POA: Diagnosis not present

## 2021-03-14 DIAGNOSIS — M25642 Stiffness of left hand, not elsewhere classified: Secondary | ICD-10-CM | POA: Diagnosis not present

## 2021-03-14 DIAGNOSIS — M25532 Pain in left wrist: Secondary | ICD-10-CM | POA: Diagnosis not present

## 2021-03-21 DIAGNOSIS — M25632 Stiffness of left wrist, not elsewhere classified: Secondary | ICD-10-CM | POA: Diagnosis not present

## 2021-03-21 DIAGNOSIS — M25532 Pain in left wrist: Secondary | ICD-10-CM | POA: Diagnosis not present

## 2021-03-21 DIAGNOSIS — M25642 Stiffness of left hand, not elsewhere classified: Secondary | ICD-10-CM | POA: Diagnosis not present

## 2021-03-31 DIAGNOSIS — M25532 Pain in left wrist: Secondary | ICD-10-CM | POA: Diagnosis not present

## 2021-03-31 DIAGNOSIS — M25642 Stiffness of left hand, not elsewhere classified: Secondary | ICD-10-CM | POA: Diagnosis not present

## 2021-03-31 DIAGNOSIS — M25632 Stiffness of left wrist, not elsewhere classified: Secondary | ICD-10-CM | POA: Diagnosis not present

## 2021-04-18 DIAGNOSIS — M25532 Pain in left wrist: Secondary | ICD-10-CM | POA: Diagnosis not present

## 2021-04-18 DIAGNOSIS — M25632 Stiffness of left wrist, not elsewhere classified: Secondary | ICD-10-CM | POA: Diagnosis not present

## 2021-04-18 DIAGNOSIS — M25642 Stiffness of left hand, not elsewhere classified: Secondary | ICD-10-CM | POA: Diagnosis not present

## 2021-05-02 DIAGNOSIS — M25642 Stiffness of left hand, not elsewhere classified: Secondary | ICD-10-CM | POA: Diagnosis not present

## 2021-05-02 DIAGNOSIS — M25532 Pain in left wrist: Secondary | ICD-10-CM | POA: Diagnosis not present

## 2021-05-02 DIAGNOSIS — M25632 Stiffness of left wrist, not elsewhere classified: Secondary | ICD-10-CM | POA: Diagnosis not present

## 2021-05-15 ENCOUNTER — Other Ambulatory Visit: Payer: Self-pay | Admitting: *Deleted

## 2021-05-15 MED ORDER — BUDESONIDE-FORMOTEROL FUMARATE 80-4.5 MCG/ACT IN AERO: INHALATION_SPRAY | RESPIRATORY_TRACT | 3 refills | Status: DC

## 2021-05-15 NOTE — Telephone Encounter (Signed)
Pharmacy requested refill

## 2021-12-11 ENCOUNTER — Encounter: Payer: Medicare Other | Admitting: Family

## 2021-12-18 ENCOUNTER — Ambulatory Visit (INDEPENDENT_AMBULATORY_CARE_PROVIDER_SITE_OTHER): Payer: Medicare Other | Admitting: Family

## 2021-12-18 ENCOUNTER — Encounter: Payer: Self-pay | Admitting: Family

## 2021-12-18 DIAGNOSIS — Z0000 Encounter for general adult medical examination without abnormal findings: Secondary | ICD-10-CM

## 2021-12-18 NOTE — Progress Notes (Signed)
This service is provided via telemedicine  No vital signs collected/recorded due to the encounter was a telemedicine visit.   Location of patient (ex: home, work):  [place]  Patient consents to a telephone visit:  Yes  Location of the provider (ex: office, home):  [employer].   Name of any referring provider:  Nakyla Bracco, Nelda Bucks, NP   Names of all persons participating in the telemedicine service and their role in the encounter:  Patient, Heriberto Antigua, [place], [place], Webb Silversmith, NP.    Time spent on call: 8 minutes spent on the phone with Medical Assistant.     Subjective:   Rebecca [name] is a 71 y.o. female who presents for Medicare Annual (Subsequent) preventive examination.  Review of Systems     Cardiac Risk Factors include: advanced age (>46mn, >>73women)     Objective:    There were no vitals filed for this visit. There is no height or weight on file to calculate BMI.     12/18/2021    9:24 AM 01/04/2021    7:19 AM 12/15/2020    9:04 AM 12/09/2020    8:30 AM 03/24/2020    1:08 PM 03/22/2020   12:37 PM 12/04/2019    8:51 AM  Advanced Directives  Does Patient Have a Medical Advance Directive? No No No No No Yes Yes  Type of Advance Directive      [place]Out of facility DNR (pink MOST or yellow form)  Does patient want to make changes to medical advance directive?     No - Patient declined No - Patient declined No - Patient declined  Copy of [place] Chart?       No - copy requested  Would patient like information on creating a medical advance directive? No - Patient declined No - Patient declined No - Patient declined No - Patient declined       Current Medications (verified) Outpatient Encounter Medications as of 12/18/2021  Medication Sig   albuterol (VENTOLIN HFA) 108 (90 Base) MCG/ACT inhaler Inhale 1 puff into the lungs every 6 (six) hours as needed for wheezing or shortness of breath.    budesonide-formoterol (SYMBICORT) 80-4.5 MCG/ACT inhaler TAKE 2 PUFFS BY MOUTH TWICE A [place]   fexofenadine (ALLEGRA) 180 MG tablet Take 1 tablet (180 mg total) by mouth daily.   ibuprofen (ADVIL) 200 MG tablet Take 400 mg by mouth every 8 (eight) hours as needed for moderate pain.   [DISCONTINUED] acetaminophen (TYLENOL) 325 MG tablet Take 650 mg by mouth every 6 (six) hours as needed for moderate pain.   No facility-administered encounter medications on file as of 12/18/2021.    Allergies (verified) Patient has no known allergies.   History: Past Medical History:  Diagnosis Date   Allergy    seasonal   Asthma    Osteoporosis    Other hyperlipidemia    Pneumonia    Seasonal allergies    Shingles    Past Surgical History:  Procedure Laterality Date   OPEN REDUCTION INTERNAL FIXATION (ORIF) DISTAL RADIAL FRACTURE Left 01/04/2021   Procedure: OPEN REDUCTION INTERNAL FIXATION (ORIF) DISTAL RADIAL FRACTURE;  Surgeon: HShona Needles MD;  [facility]  Service: Orthopedics;  Laterality: Left;   ORIF TIBIA PLATEAU Right 03/16/2020   Procedure: OPEN REDUCTION INTERNAL FIXATION (ORIF) TIBIAL PLATEAU;  Surgeon: HShona Needles MD;  [facility]  Service: Orthopedics;  Laterality: Right;   WISDOM  TOOTH EXTRACTION     Family History  Problem Relation Age of Onset   Cancer - Colon Mother 1   Osteoporosis Mother    Bipolar disorder Mother    Mental illness Mother    Colon cancer Mother    Other Father 83       airplane accident   Anxiety disorder Daughter 54   Diabetes Mellitus II Paternal Grandfather    Esophageal cancer Neg Hx    Stomach cancer Neg Hx    Rectal cancer Neg Hx    Social History   Socioeconomic History   Marital status: Single    Spouse name: Not on file   Number of children: Not on file   Years of education: Not on file   Highest education level: Not on file  Occupational History   Not on file  Tobacco Use   Smoking status: Never   Smokeless  tobacco: Never  Vaping Use   Vaping Use: Never used  Substance and Sexual Activity   Alcohol use: Yes    Alcohol/week: 5.0 standard drinks of alcohol    Types: 5 Glasses of wine per week   Drug use: Never   Sexual activity: Not Currently  Other Topics Concern   Not on file  Social History Narrative   ** Merged History Encounter **       Social History  Diet? No beef or pork  Do you drink/eat things with caffeine? Tea  Marital status?                 married                   What year were you married? 77-94, 98 - current  Do you live in a house, apartment, assisted living, cond   o, trailer, etc.? apartment  Is it one or more stories? 4th  How many persons live in your home? 2  Do you have any pets in your home? (please list) no  Highest level of education completed? MDIV  Current or past profession: Therapist, music   or  Do you exercise?            some                           Type & how often? Walk, yoga 3 x wk  Advanced Directives  Do you have a living will? yes  Do you have a DNR form?          yes                        If not, do you want to discuss on   e?  Do you have signed POA/HPOA for forms? yes  Functional Status  Do you have difficulty bathing or dressing yourself? no  Do you have difficulty preparing food or eating? no  Do you have difficulty managing your medications? no  Do you have    difficulty managing your finances? No   Do you have difficulty affording your medications? No     Social Determinants of Radio broadcast assistant Strain: Not on file  Food Insecurity: Not on file  Transportation Needs: Not on file  Physical Activity: Not on file  Stress: Not on file  Social Connections: Not on file    Tobacco Counseling Counseling given: Not Answered   Clinical Intake:  Pre-visit preparation completed: No  Pain : No/denies pain     BMI - recorded: 16.86 Nutritional Status: BMI <19  Underweight Nutritional  Risks: None Diabetes: No     Diabetic?No         Activities of Daily Living    12/18/2021    9:45 AM 01/04/2021    7:18 AM  In your present state of health, do you have any difficulty performing the following activities:  Hearing? 0 0  Vision? 0 0  Difficulty concentrating or making decisions? 0 0  Walking or climbing stairs? 0 0  Dressing or bathing? 0 1  Comment  due to left arm injury  Doing errands, shopping? 0   Preparing Food and eating ? N   Using the Toilet? N   In the past six months, have you accidently leaked urine? N   Do you have problems with loss of bowel control? N   Managing your Medications? N   Managing your Finances? N   Housekeeping or managing your Housekeeping? N     Patient Care Team: Cleophus Mendonsa, Nelda Bucks, NP as PCP - General (Family Medicine) Lauree Chandler, NP (Geriatric Medicine)  Indicate any recent Medical Services you may have received from other than [facility] providers in the past year (date may be approximate).     Assessment:   This is a routine wellness examination for Mertie.  Hearing/Vision screen Hearing Screening - Comments:: No Hearing Concerns.  Vision Screening - Comments:: No vision concerns. Patient wears prescription glasses. Patient last eye exam 2022  Dietary issues and exercise activities discussed: Current Exercise Habits: Home exercise routine, Type of exercise: stretching;walking, Time (Minutes): 45, Frequency (Times/Week): 7, Weekly Exercise (Minutes/Week): 315, Intensity: Moderate   Goals Addressed             This Visit's Progress    Gain weight   On track    I want to gain some weight 110 lbs  2. Eat healthy diet to keep cholesterol         Depression Screen    12/18/2021    9:21 AM 12/09/2020    8:25 AM 05/30/2020    9:14 AM 12/04/2019    8:48 AM 09/25/2018   10:24 AM 09/25/2018   10:23 AM 07/10/2017    8:52 AM  PHQ 2/9 Scores  PHQ - 2 Score 0 0 0 0 0 0 0    Fall Risk    12/18/2021    9:21 AM  12/15/2020    9:04 AM 12/09/2020    8:25 AM 05/30/2020    9:14 AM 12/04/2019    8:48 AM  Fall Risk   Falls in the past year? 0 0 0 0 0  Number falls in past yr: 0 0 0 0 0  Injury with Fall? 0 0 0 0 0  Risk for fall due to : No Fall Risks No Fall Risks No Fall Risks    Follow up Falls evaluation completed Falls evaluation completed Falls evaluation completed      FALL RISK PREVENTION PERTAINING TO THE HOME:  Any stairs in or around the home? Yes  If so, are there any without handrails? No  Home free of loose throw rugs in walkways, pet beds, electrical cords, etc? No  Adequate lighting in your home to reduce risk of falls? Yes   ASSISTIVE DEVICES UTILIZED TO PREVENT FALLS:  Life alert? No  Use of a cane, walker or w/c? No  Grab bars in the bathroom? No  Shower chair or bench  in shower? Yes  Elevated toilet seat or a handicapped toilet? No   TIMED UP AND GO:  Was the test performed? No .  Length of time to ambulate 10 feet: N/A  sec.   Gait steady and fast without use of assistive device  Cognitive Function:    09/25/2018   10:24 AM  MMSE - Mini Mental State Exam  Orientation to time 4  Orientation to Place 5  Registration 3  Attention/ Calculation 5  Recall 3  Language- name 2 objects 2  Language- repeat 1  Language- follow 3 step command 3  Language- read & follow direction 1  Write a sentence 1  Copy design 1  Total score 29        12/18/2021    9:22 AM 12/09/2020    8:26 AM 12/04/2019    8:49 AM  6CIT Screen  What Year? 0 points 0 points 0 points  What month? 0 points 0 points 0 points  What time? 0 points 0 points 0 points  Count back from 20 0 points 0 points 0 points  Months in reverse 0 points 0 points 0 points  Repeat phrase 2 points 0 points 0 points  Total Score 2 points 0 points 0 points    Immunizations Immunization History  Administered Date(s) Administered   Fluad Quad(high Dose 65+) 12/15/2020   Influenza, High Dose Seasonal PF 02/13/2018,  01/06/2019   Influenza-Unspecified 12/31/2016   PFIZER(Purple Top)SARS-COV-2 Vaccination 04/29/2019, 05/20/2019, 01/04/2020, 07/20/2020   Pneumococcal Conjugate-13 01/17/2017   Pneumococcal Polysaccharide-23 04/09/2019   Tdap 03/14/2020    TDAP status: Up to date  Flu Vaccine status: Due, Education has been provided regarding the importance of this vaccine. Advised may receive this vaccine at local pharmacy or Health Dept. Aware to provide a copy of the vaccination record if obtained from local pharmacy or Health Dept. Verbalized acceptance and understanding.  Pneumococcal vaccine status: Up to date  Covid-19 vaccine status: Information provided on how to obtain vaccines.   Qualifies for Shingles Vaccine? Yes   Zostavax completed No   Shingrix Completed?: No.    Education has been provided regarding the importance of this vaccine. Patient has been advised to call insurance company to determine out of pocket expense if they have not yet received this vaccine. Advised may also receive vaccine at local pharmacy or Health Dept. Verbalized acceptance and understanding.  Screening Tests Health Maintenance  Topic Date Due   Zoster Vaccines- Shingrix (1 of 2) Never done   COVID-19 Vaccine (5 - Pfizer series) 09/14/2020   INFLUENZA VACCINE  10/31/2021   COLONOSCOPY (Pts 45-46yr Insurance coverage will need to be confirmed)  08/22/2022   MAMMOGRAM  02/16/2023   TETANUS/TDAP  03/14/2030   Pneumonia Vaccine 71 Years old  Completed   DEXA SCAN  Completed   Hepatitis C Screening  Completed   HPV VACCINES  Aged Out    Health Maintenance  Health Maintenance Due  Topic Date Due   Zoster Vaccines- Shingrix (1 of 2) Never done   COVID-19 Vaccine (5 - Pfizer series) 09/14/2020   INFLUENZA VACCINE  10/31/2021    Colorectal cancer screening: Type of screening: Colonoscopy. Completed 08/21/2017. Repeat every 5 years  Mammogram status: Completed 02/15/2021. Repeat every year  Bone Density  status: Completed 5/16/20219. Results reflect: Bone density results: OSTEOPOROSIS. Repeat every 2 years.  Lung Cancer Screening: (Low Dose CT Chest recommended if Age 69-80years, 30 pack-year currently smoking OR have quit w/in 15years.) does not  qualify.   Lung Cancer Screening Referral: No   Additional Screening:  Hepatitis C Screening: does qualify; Completed yes   Vision Screening: Recommended annual ophthalmology exams for early detection of glaucoma and other disorders of the eye. Is the patient up to date with their annual eye exam?  Yes  Who is the provider or what is the name of the office in which the patient attends annual eye exams?  If pt is not established with a provider, would they like to be referred to a provider to establish care? No .   Dental Screening: Recommended annual dental exams for proper oral hygiene  Community Resource Referral / Chronic Care Management: CRR required this visit?  No   CCM required this visit?  No      Plan:     I have personally reviewed and noted the following in the patient's chart:   Medical and social history Use of alcohol, tobacco or illicit drugs  Current medications and supplements including opioid prescriptions. Patient is not currently taking opioid prescriptions. Functional ability and status Nutritional status Physical activity Advanced directives List of other physicians Hospitalizations, surgeries, and ER visits in previous 12 months Vitals Screenings to include cognitive, depression, and falls Referrals and appointments  In addition, I have reviewed and discussed with patient certain preventive protocols, quality metrics, and best practice recommendations. A written personalized care plan for preventive services as well as general preventive health recommendations were provided to patient.     Sandrea Hughs, NP   12/18/2021   Nurse Notes: Advised to get Shingrix vaccine at the pharmacy

## 2021-12-21 ENCOUNTER — Ambulatory Visit: Payer: Medicare Other | Admitting: Family

## 2021-12-26 ENCOUNTER — Encounter: Payer: Self-pay | Admitting: Family

## 2021-12-26 ENCOUNTER — Ambulatory Visit (INDEPENDENT_AMBULATORY_CARE_PROVIDER_SITE_OTHER): Payer: Medicare Other | Admitting: Family

## 2021-12-26 VITALS — BP 110/80 | HR 87 | Temp 97.9°F | Resp 16 | Ht 64.0 in | Wt 102.0 lb

## 2021-12-26 DIAGNOSIS — J4520 Mild intermittent asthma, uncomplicated: Secondary | ICD-10-CM

## 2021-12-26 DIAGNOSIS — M810 Age-related osteoporosis without current pathological fracture: Secondary | ICD-10-CM

## 2021-12-26 DIAGNOSIS — E785 Hyperlipidemia, unspecified: Secondary | ICD-10-CM

## 2021-12-26 DIAGNOSIS — Z681 Body mass index (BMI) 19 or less, adult: Secondary | ICD-10-CM | POA: Diagnosis not present

## 2021-12-26 DIAGNOSIS — Z23 Encounter for immunization: Secondary | ICD-10-CM

## 2021-12-26 NOTE — Progress Notes (Signed)
Provider: [name] FNP-C   Dakai Braithwaite, Nelda Bucks, NP  Patient Care Team: Jolly Bleicher, Nelda Bucks, NP as PCP - General (Family Medicine) Lauree Chandler, NP (Geriatric Medicine)  Extended Emergency Contact Information Primary Emergency Contact: Coolidge,[name] Address: [address] [place]          [place], [place] [address] [name] of [place] Phone: [phone] Mobile Phone: [phone] Relation: Spouse Secondary Emergency Contact: Shedd,[name] Home Phone: [phone] Relation: Brother  Code Status:  Full Code  Goals of care: Advanced Directive information    12/26/2021   10:08 AM  Advanced Directives  Does Patient Have a Medical Advance Directive? No  Would patient like information on creating a medical advance directive? No - Patient declined     Chief Complaint  Patient presents with   Annual Exam    PHYSICAL   Immunizations    Discuss the need for Shingrix vaccine, Covid Boosterm and Influenza vaccine.     HPI:  Pt is a 71 y.o. female seen today for annual exam for medical management of chronic diseases.  She denies any acute issues. Previous Cholesterol 240 and LDL 149 continues to watch her diet and exercise   Asthma - has not required albuterol and uses Symbicort.She denies any fever,chills,cough,fatigue,body aches,runny nose,chest tightness,chest pain,palpitation or shortness of breath. Appetite is good.Has gained weight 3 lbs over 6 months.    Past Medical History:  Diagnosis Date   Allergy    seasonal   Asthma    Osteoporosis    Other hyperlipidemia    Pneumonia    Seasonal allergies    Shingles    Past Surgical History:  Procedure Laterality Date   OPEN REDUCTION INTERNAL FIXATION (ORIF) DISTAL RADIAL FRACTURE Left 01/04/2021   Procedure: OPEN REDUCTION INTERNAL FIXATION (ORIF) DISTAL RADIAL FRACTURE;  Surgeon: Shona Needles, MD;  Location: [facility];  Service: Orthopedics;  Laterality: Left;   ORIF TIBIA PLATEAU Right 03/16/2020    Procedure: OPEN REDUCTION INTERNAL FIXATION (ORIF) TIBIAL PLATEAU;  Surgeon: Shona Needles, MD;  Location: [facility];  Service: Orthopedics;  Laterality: Right;   WISDOM TOOTH EXTRACTION      Not on File  Allergies as of 12/26/2021   Not on File      Medication List        Accurate as of December 26, 2021 10:14 AM. If you have any questions, ask your nurse or doctor.          albuterol 108 (90 Base) MCG/ACT inhaler Commonly known as: VENTOLIN HFA Inhale 1 puff into the lungs every 6 (six) hours as needed for wheezing or shortness of breath.   budesonide-formoterol 80-4.5 MCG/ACT inhaler Commonly known as: Symbicort TAKE 2 PUFFS BY MOUTH TWICE A [place]   fexofenadine 180 MG tablet Commonly known as: ALLEGRA Take 1 tablet (180 mg total) by mouth daily.   ibuprofen 200 MG tablet Commonly known as: ADVIL Take 400 mg by mouth every 8 (eight) hours as needed for moderate pain.        Review of Systems  Constitutional:  Negative for appetite change, chills, fatigue, fever and unexpected weight change.  HENT:  Negative for congestion, dental problem, ear discharge, ear pain, facial swelling, hearing loss, nosebleeds, postnasal drip, rhinorrhea, sinus pressure, sinus pain, sneezing, sore throat, tinnitus and trouble swallowing.   Eyes:  Negative for pain, discharge, redness, itching and visual disturbance.  Respiratory:  Negative for cough, chest tightness, shortness of breath and wheezing.   Cardiovascular:  Negative for chest pain, palpitations and leg swelling.  Gastrointestinal:  Negative for abdominal distention, abdominal pain, blood in stool, constipation, diarrhea, nausea and vomiting.  Endocrine: Negative for cold intolerance, heat intolerance, polydipsia, polyphagia and polyuria.  Genitourinary:  Negative for difficulty urinating, dysuria, flank pain, frequency and urgency.  Musculoskeletal:  Negative for arthralgias, back pain, gait problem, joint swelling, myalgias,  neck pain and neck stiffness.  Skin:  Negative for color change, pallor, rash and wound.  Neurological:  Negative for dizziness, syncope, speech difficulty, weakness, light-headedness, numbness and headaches.  Hematological:  Does not bruise/bleed easily.  Psychiatric/Behavioral:  Negative for agitation, behavioral problems, confusion, hallucinations, self-injury, sleep disturbance and suicidal ideas. The patient is not nervous/anxious.     Immunization History  Administered Date(s) Administered   Fluad Quad(high Dose 65+) 12/15/2020   Influenza, High Dose Seasonal PF 02/13/2018, 01/06/2019   Influenza-Unspecified 12/31/2016   PFIZER(Purple Top)SARS-COV-2 Vaccination 04/29/2019, 05/20/2019, 01/04/2020, 07/20/2020   Pneumococcal Conjugate-13 01/17/2017   Pneumococcal Polysaccharide-23 04/09/2019   Tdap 03/14/2020   Pertinent  Health Maintenance Due  Topic Date Due   INFLUENZA VACCINE  10/31/2021   COLONOSCOPY (Pts 45-60yr Insurance coverage will need to be confirmed)  08/22/2022   MAMMOGRAM  02/16/2023   DEXA SCAN  Completed      12/09/2020    8:25 AM 12/15/2020    9:04 AM 01/04/2021    7:17 AM 12/18/2021    9:21 AM 12/26/2021   10:08 AM  Fall Risk  Falls in the past year? 0 0  0 0  Was there an injury with Fall? 0 0  0 0  Fall Risk Category Calculator 0 0  0 0  Fall Risk Category Low Low  Low Low  Patient Fall Risk Level Low fall risk Low fall risk Moderate fall risk Low fall risk Low fall risk  Patient at Risk for Falls Due to No Fall Risks No Fall Risks  No Fall Risks No Fall Risks  Fall risk Follow up Falls evaluation completed Falls evaluation completed  Falls evaluation completed Falls evaluation completed   Functional Status Survey:    Vitals:   12/26/21 1003  BP: 110/80  Pulse: 87  Resp: 16  Temp: 97.9 F (36.6 C)  SpO2: 95%  Weight: 102 lb (46.3 kg)  Height: 5' 4"  (1.626 m)   Body mass index is 17.51 kg/m. Physical Exam Vitals reviewed.  Constitutional:       General: She is not in acute distress.    Appearance: Normal appearance. She is underweight. She is not ill-appearing or diaphoretic.  HENT:     Head: Normocephalic.     Right Ear: Tympanic membrane, ear canal and external ear normal. There is no impacted cerumen.     Left Ear: Tympanic membrane, ear canal and external ear normal. There is no impacted cerumen.     Nose: Nose normal. No congestion or rhinorrhea.     Mouth/Throat:     Mouth: Mucous membranes are moist.     Pharynx: Oropharynx is clear. No oropharyngeal exudate or posterior oropharyngeal erythema.  Eyes:     General: No scleral icterus.       Right eye: No discharge.        Left eye: No discharge.     Extraocular Movements: Extraocular movements intact.     Conjunctiva/sclera: Conjunctivae normal.     Pupils: Pupils are equal, round, and reactive to light.  Neck:     Vascular: No carotid bruit.  Cardiovascular:  Rate and Rhythm: Normal rate and regular rhythm.     Pulses: Normal pulses.     Heart sounds: Normal heart sounds. No murmur heard.    No friction rub. No gallop.  Pulmonary:     Effort: Pulmonary effort is normal. No respiratory distress.     Breath sounds: Normal breath sounds. No wheezing, rhonchi or rales.  Chest:     Chest wall: No tenderness.  Abdominal:     General: Bowel sounds are normal. There is no distension.     Palpations: Abdomen is soft. There is no mass.     Tenderness: There is no abdominal tenderness. There is no right CVA tenderness, left CVA tenderness, guarding or rebound.  Musculoskeletal:        General: No swelling or tenderness. Normal range of motion.     Cervical back: Normal range of motion. No rigidity or tenderness.     Right lower leg: No edema.     Left lower leg: No edema.  Lymphadenopathy:     Cervical: No cervical adenopathy.  Skin:    General: Skin is warm and dry.     Coloration: Skin is not pale.     Findings: No bruising, erythema, lesion or rash.   Neurological:     Mental Status: She is alert and oriented to person, place, and time.     Cranial Nerves: No cranial nerve deficit.     Sensory: No sensory deficit.     Motor: No weakness.     Coordination: Coordination normal.     Gait: Gait normal.  Psychiatric:        Mood and Affect: Mood normal.        Speech: Speech normal.        Behavior: Behavior normal.        Thought Content: Thought content normal.        Judgment: Judgment normal.    Labs reviewed: No results for input(s): "NA", "K", "CL", "CO2", "GLUCOSE", "BUN", "CREATININE", "CALCIUM", "MG", "PHOS" in the last 8760 hours. No results for input(s): "AST", "ALT", "ALKPHOS", "BILITOT", "PROT", "ALBUMIN" in the last 8760 hours. No results for input(s): "WBC", "NEUTROABS", "HGB", "HCT", "MCV", "PLT" in the last 8760 hours. Lab Results  Component Value Date   TSH 2.42 12/12/2020   No results found for: "HGBA1C" Lab Results  Component Value Date   CHOL 240 (H) 12/12/2020   HDL 72 12/12/2020   LDLCALC 149 (H) 12/12/2020   TRIG 86 12/12/2020   CHOLHDL 3.3 12/12/2020    Significant Diagnostic Results in last 30 days:  No results found.  Assessment/Plan 1. Need for influenza vaccination Afebrile Flut shot administered by CMA no acute reaction reported.  - Flu Vaccine QUAD High Dose(Fluad)  2. Hyperlipidemia, unspecified hyperlipidemia type Previous LDL not at goal  - Recommend a low saturated fats, low carbohydrates and high vegetable diet also exercise at least 3 times per week for 30 minutes. - Lipid panel; Future  3. Age-related osteoporosis without current pathological fracture previous bone density reviewed done 5/16/20291 Left Femur Neck T-score - 4.3  has tried Bisphosphates such as Fosamax and Reclast  but were ineffective. - weight bearing exercises recommended   4. Mild intermittent asthma, unspecified whether complicated Symptoms well controlled  - continue on Albuterol PRN and Symbicort twice  daily  - COMPLETE METABOLIC PANEL WITH GFR; Future - CBC with Differential/Platelet; Future  5. Body mass index (BMI) 19.9 or less, adult BMI 17.51 suspect normal body habitus verse  malnutrition.Appetite is good.Does exercise.  Has had weight gain.    Family/ staff Communication: Reviewed plan of care with patient verbalized understanding   Labs/tests ordered: - CBC with Differential/Platelet - CMP with eGFR([facility]) - Lipid panel  Next Appointment : Return in about 1 year (around 12/27/2022) for medical mangement of chronic issues., fasting labs in one week or sooner .   Sandrea Hughs, NP

## 2021-12-27 ENCOUNTER — Other Ambulatory Visit: Payer: Medicare Other

## 2021-12-27 DIAGNOSIS — E785 Hyperlipidemia, unspecified: Secondary | ICD-10-CM

## 2021-12-27 DIAGNOSIS — J4520 Mild intermittent asthma, uncomplicated: Secondary | ICD-10-CM

## 2021-12-28 LAB — COMPLETE METABOLIC PANEL WITH GFR
AG Ratio: 1.7 (calc) (ref 1.0–2.5)
ALT: 12 U/L (ref 6–29)
AST: 19 U/L (ref 10–35)
Albumin: 4.7 g/dL (ref 3.6–5.1)
Alkaline phosphatase (APISO): 85 U/L (ref 37–153)
BUN: 12 mg/dL (ref 7–25)
CO2: 25 mmol/L (ref 20–32)
Calcium: 9.9 mg/dL (ref 8.6–10.4)
Chloride: 99 mmol/L (ref 98–110)
Creat: 0.75 mg/dL (ref 0.50–1.05)
Globulin: 2.8 g/dL (calc) (ref 1.9–3.7)
Glucose, Bld: 85 mg/dL (ref 65–99)
Potassium: 5.4 mmol/L — ABNORMAL HIGH (ref 3.5–5.3)
Sodium: 137 mmol/L (ref 135–146)
Total Bilirubin: 0.7 mg/dL (ref 0.2–1.2)
Total Protein: 7.5 g/dL (ref 6.1–8.1)
eGFR: 86 mL/min/{1.73_m2} (ref >=60)

## 2021-12-28 LAB — CBC WITH DIFFERENTIAL/PLATELET
Absolute Monocytes: 767 cells/uL (ref 200–950)
Basophils Absolute: 52 cells/uL (ref 0–200)
Basophils Relative: 0.8 %
Eosinophils Absolute: 397 cells/uL (ref 15–500)
Eosinophils Relative: 6.1 %
HCT: 50.4 % — ABNORMAL HIGH (ref 35.0–45.0)
Hemoglobin: 16.8 g/dL — ABNORMAL HIGH (ref 11.7–15.5)
Lymphs Abs: 2568 cells/uL (ref 850–3900)
MCH: 30.4 pg (ref 27.0–33.0)
MCHC: 33.3 g/dL (ref 32.0–36.0)
MCV: 91.3 fL (ref 80.0–100.0)
MPV: 10.6 fL (ref 7.5–12.5)
Monocytes Relative: 11.8 %
Neutro Abs: 2717 cells/uL (ref 1500–7800)
Neutrophils Relative %: 41.8 %
Platelets: 316 10*3/uL (ref 140–400)
RBC: 5.52 10*6/uL — ABNORMAL HIGH (ref 3.80–5.10)
RDW: 11.8 % (ref 11.0–15.0)
Total Lymphocyte: 39.5 %
WBC: 6.5 10*3/uL (ref 3.8–10.8)

## 2021-12-28 LAB — LIPID PANEL
Cholesterol: 260 mg/dL — ABNORMAL HIGH (ref <200)
HDL: 87 mg/dL (ref >=50)
LDL Cholesterol (Calc): 151 mg/dL (calc) — ABNORMAL HIGH
Non-HDL Cholesterol (Calc): 173 mg/dL (calc) — ABNORMAL HIGH (ref <130)
Total CHOL/HDL Ratio: 3 (calc) (ref <5.0)
Triglycerides: 103 mg/dL (ref <150)

## 2021-12-31 NOTE — Patient Instructions (Signed)
Rebecca [name] , Thank you for taking time to come for your Medicare Wellness Visit. I appreciate your ongoing commitment to your health goals. Please review the following plan we discussed and let me know if I can assist you in the future.   Screening recommendations/referrals: Colonoscopy: Up-to-date Mammogram: Up-to-date Bone Density: Up-to-date Recommended yearly ophthalmology/optometry visit for glaucoma screening and checkup Recommended yearly dental visit for hygiene and checkup  Vaccinations: Influenza vaccine- due annually in September/October Pneumococcal vaccine: Up-to-date Tdap vaccine: Up-to-date Shingles vaccine: Due please get Shingrix vaccine at the pharmacy  Advanced directives: No  Conditions/risks identified: Cardiac Risk Factors include: advanced age (>82mn, >>15women)  Next appointment: 1 year   Preventive Care 658Years and Older, Female Preventive care refers to lifestyle choices and visits with your health care provider that can promote health and wellness. What does preventive care include? A yearly physical exam. This is also called an annual well check. Dental exams once or twice a year. Routine eye exams. Ask your health care provider how often you should have your eyes checked. Personal lifestyle choices, including: Daily care of your teeth and gums. Regular physical activity. Eating a healthy diet. Avoiding tobacco and drug use. Limiting alcohol use. Practicing safe sex. Taking low-dose aspirin every [place]. Taking vitamin and mineral supplements as recommended by your health care provider. What happens during an annual well check? The services and screenings done by your health care provider during your annual well check will depend on your age, overall health, lifestyle risk factors, and family history of disease. Counseling  Your health care provider may ask you questions about your: Alcohol use. Tobacco use. Drug use. Emotional well-being. Home  and relationship well-being. Sexual activity. Eating habits. History of falls. Memory and ability to understand (cognition). Work and work [employer] Reproductive health. Screening  You may have the following tests or measurements: Height, weight, and BMI. Blood pressure. Lipid and cholesterol levels. These may be checked every 5 years, or more frequently if you are over 550years old. Skin check. Lung cancer screening. You may have this screening every year starting at age 4232if you have a 30-pack-year history of smoking and currently smoke or have quit within the past 15 years. Fecal occult blood test (FOBT) of the stool. You may have this test every year starting at age 69 Flexible sigmoidoscopy or colonoscopy. You may have a sigmoidoscopy every 5 years or a colonoscopy every 10 years starting at age 69 Hepatitis C blood test. Hepatitis B blood test. Sexually transmitted disease (STD) testing. Diabetes screening. This is done by checking your blood sugar (glucose) after you have not eaten for a while (fasting). You may have this done every 1-3 years. Bone density scan. This is done to screen for osteoporosis. You may have this done starting at age 69 Mammogram. This may be done every 1-2 years. Talk to your health care provider about how often you should have regular mammograms. Talk with your health care provider about your test results, treatment options, and if necessary, the need for more tests. Vaccines  Your health care provider may recommend certain vaccines, such as: Influenza vaccine. This is recommended every year. Tetanus, diphtheria, and acellular pertussis (Tdap, Td) vaccine. You may need a Td booster every 10 years. Zoster vaccine. You may need this after age 69 Pneumococcal 13-valent conjugate (PCV13) vaccine. One dose is recommended after age 69 Pneumococcal polysaccharide (PPSV23) vaccine. One dose is recommended after age 69 Talk to your health care provider  about which screenings and vaccines you need and how often you need them. This information is not intended to replace advice given to you by your health care provider. Make sure you discuss any questions you have with your health care provider. Document Released: 04/15/2015 Document Revised: 12/07/2015 Document Reviewed: 01/18/2015 Elsevier Interactive Patient Education  2017 [place] Prevention in the Home Falls can cause injuries. They can happen to people of all ages. There are many things you can do to make your home safe and to help prevent falls. What can I do on the outside of my home? Regularly fix the edges of walkways and driveways and fix any cracks. Remove anything that might make you trip as you walk through a door, such as a raised step or threshold. Trim any bushes or trees on the path to your home. Use bright outdoor lighting. Clear any walking paths of anything that might make someone trip, such as rocks or tools. Regularly check to see if handrails are loose or broken. Make sure that both sides of any steps have handrails. Any raised decks and porches should have guardrails on the edges. Have any leaves, snow, or ice cleared regularly. Use sand or salt on walking paths during winter. Clean up any spills in your garage right away. This includes oil or grease spills. What can I do in the bathroom? Use night lights. Install grab bars by the toilet and in the tub and shower. Do not use towel bars as grab bars. Use non-skid mats or decals in the tub or shower. If you need to sit down in the shower, use a plastic, non-slip stool. Keep the floor dry. Clean up any water that spills on the floor as soon as it happens. Remove soap buildup in the tub or shower regularly. Attach bath mats securely with double-sided non-slip rug tape. Do not have throw rugs and other things on the floor that can make you trip. What can I do in the bedroom? Use night lights. Make sure  that you have a light by your bed that is easy to reach. Do not use any sheets or blankets that are too big for your bed. They should not hang down onto the floor. Have a firm chair that has side arms. You can use this for support while you get dressed. Do not have throw rugs and other things on the floor that can make you trip. What can I do in the kitchen? Clean up any spills right away. Avoid walking on wet floors. Keep items that you use a lot in easy-to-reach places. If you need to reach something above you, use a strong step stool that has a grab bar. Keep electrical cords out of the way. Do not use floor polish or wax that makes floors slippery. If you must use wax, use non-skid floor wax. Do not have throw rugs and other things on the floor that can make you trip. What can I do with my stairs? Do not leave any items on the stairs. Make sure that there are handrails on both sides of the stairs and use them. Fix handrails that are broken or loose. Make sure that handrails are as long as the stairways. Check any carpeting to make sure that it is firmly attached to the stairs. Fix any carpet that is loose or worn. Avoid having throw rugs at the top or bottom of the stairs. If you do have throw rugs, attach them to the floor with  carpet tape. Make sure that you have a light switch at the top of the stairs and the bottom of the stairs. If you do not have them, ask someone to add them for you. What else can I do to help prevent falls? Wear shoes that: Do not have high heels. Have rubber bottoms. Are comfortable and fit you well. Are closed at the toe. Do not wear sandals. If you use a stepladder: Make sure that it is fully opened. Do not climb a closed stepladder. Make sure that both sides of the stepladder are locked into place. Ask someone to hold it for you, if possible. Clearly mark and make sure that you can see: Any grab bars or handrails. First and last steps. Where the edge of  each step is. Use tools that help you move around (mobility aids) if they are needed. These include: Canes. Walkers. Scooters. Crutches. Turn on the lights when you go into a dark area. Replace any light bulbs as soon as they burn out. Set up your furniture so you have a clear path. Avoid moving your furniture around. If any of your floors are uneven, fix them. If there are any pets around you, be aware of where they are. Review your medicines with your doctor. Some medicines can make you feel dizzy. This can increase your chance of falling. Ask your doctor what other things that you can do to help prevent falls. This information is not intended to replace advice given to you by your health care provider. Make sure you discuss any questions you have with your health care provider. Document Released: 01/13/2009 Document Revised: 08/25/2015 Document Reviewed: 04/23/2014 Elsevier Interactive Patient Education  2017 [employer].

## 2022-03-29 ENCOUNTER — Other Ambulatory Visit: Payer: Self-pay | Admitting: *Deleted

## 2022-03-29 MED ORDER — ROSUVASTATIN CALCIUM 5 MG PO TABS: 5.0000 mg | ORAL_TABLET | Freq: Every day | ORAL | 5 refills | Status: DC

## 2022-03-29 NOTE — Telephone Encounter (Signed)
Patient called and stated that she is willing to start the Statin that was recommended and would like it called into her pharmacy.   Nelda Bucks Ngetich, NP 01/01/2022 10:57 AM EDT      -Cholesterol levels have gone higher than the previous.  Recommend starting on rosuvastatin 5 mg tablet 1 by mouth daily to lower cholesterol.     Pended Rx and sent to [facility] for approval.

## 2022-03-29 NOTE — Telephone Encounter (Signed)
Rosuvastatin script send to pharmacy.

## 2022-08-28 ENCOUNTER — Other Ambulatory Visit: Payer: Self-pay | Admitting: Family

## 2022-09-04 ENCOUNTER — Encounter: Payer: Self-pay | Admitting: Internal Medicine

## 2022-09-05 ENCOUNTER — Ambulatory Visit (INDEPENDENT_AMBULATORY_CARE_PROVIDER_SITE_OTHER): Payer: Medicare Other | Admitting: Family

## 2022-09-05 ENCOUNTER — Encounter: Payer: Self-pay | Admitting: Family

## 2022-09-05 VITALS — BP 118/68 | HR 94 | Temp 97.4°F | Resp 16 | Ht 64.0 in | Wt 103.0 lb

## 2022-09-05 DIAGNOSIS — J4520 Mild intermittent asthma, uncomplicated: Secondary | ICD-10-CM | POA: Diagnosis not present

## 2022-09-05 MED ORDER — ALBUTEROL SULFATE HFA 108 (90 BASE) MCG/ACT IN AERS: 1.0000 | INHALATION_SPRAY | Freq: Four times a day (QID) | RESPIRATORY_TRACT | 3 refills | Status: DC | PRN

## 2022-09-05 NOTE — Progress Notes (Signed)
Provider: Richarda Blade FNP-C  Rebecca [name], Rebecca Citrin, NP  Patient Care Team: Mohit Zirbes, Rebecca Citrin, NP as PCP - General (Family Medicine) Sharon Seller, NP (Geriatric Medicine)  Extended Emergency Contact Information Primary Emergency Contact: Coolidge,[name] Address: [address]          [place], [place] [address] [place] of [place] Home Phone: [phone] Mobile Phone: [phone] Relation: Spouse Secondary Emergency Contact: Pereyra,[name] Home Phone: [phone] Relation: Brother  Code Status:  Full Code  Goals of care: Advanced Directive information    12/26/2021   10:08 AM  Advanced Directives  Does Patient Have a Medical Advance Directive? No  Would patient like information on creating a medical advance directive? No - Patient declined     Chief Complaint  Patient presents with   Acute Visit    Patient is being seen for breathing issues    HPI:  Pt is a 72 y.o. female seen today for an acute visit for evaluation of wheezing on exertion or when walking.she is here with Husband who provides additional HPI information. States requires her Albuterol when going out on grocery shopping.use of albuterol does not seem to help symptoms.CMA and patient checked albuterol and date has expired.will need refill. Has not been using Symbicort twice daily as prescribed.states sometimes uses once a [place].Had run out of Symbicort but was recently refilled. She denies any edema,fever,chills,fatigue,body aches,runny nose,chest tightness,chest pain or palpitation. Also states no recent contact with sick person with COVID-19 infection. States will be travelling this weekend. Husband wonders whether it's okay for patient to lift and carry pottery.I have discussed with patient to wear a mask whenever in places where it's dusty. Should be fine with lifting and carrying.Recommend using albuterol prior to strenuous activities.    Past Medical History:  Diagnosis Date   Allergy     seasonal   Asthma    Osteoporosis    Other hyperlipidemia    Pneumonia    Seasonal allergies    Shingles    Past Surgical History:  Procedure Laterality Date   OPEN REDUCTION INTERNAL FIXATION (ORIF) DISTAL RADIAL FRACTURE Left 01/04/2021   Procedure: OPEN REDUCTION INTERNAL FIXATION (ORIF) DISTAL RADIAL FRACTURE;  Surgeon: Roby Lofts, MD;  Location: [facility] OR;  Service: Orthopedics;  Laterality: Left;   ORIF TIBIA PLATEAU Right 03/16/2020   Procedure: OPEN REDUCTION INTERNAL FIXATION (ORIF) TIBIAL PLATEAU;  Surgeon: Roby Lofts, MD;  Location: [facility] OR;  Service: Orthopedics;  Laterality: Right;   WISDOM TOOTH EXTRACTION      No Known Allergies  Outpatient Encounter Medications as of 09/05/2022  Medication Sig   albuterol (VENTOLIN HFA) 108 (90 Base) MCG/ACT inhaler Inhale 1 puff into the lungs every 6 (six) hours as needed for wheezing or shortness of breath.   budesonide-formoterol (SYMBICORT) 80-4.5 MCG/ACT inhaler INHALE 2 PUFFS BY MOUTH TWICE A Pogorzelski   fexofenadine (ALLEGRA) 180 MG tablet Take 1 tablet (180 mg total) by mouth daily.   rosuvastatin (CRESTOR) 5 MG tablet TAKE 1 TABLET (5 MG TOTAL) BY MOUTH DAILY.   [DISCONTINUED] ibuprofen (ADVIL) 200 MG tablet Take 400 mg by mouth every 8 (eight) hours as needed for moderate pain.   No facility-administered encounter medications on file as of 09/05/2022.    Review of Systems  Constitutional:  Negative for appetite change, chills, fatigue, fever and unexpected weight change.  HENT:  Negative for congestion, ear discharge, ear pain, hearing loss, nosebleeds, postnasal drip, rhinorrhea, sinus pressure, sinus pain, sneezing, sore throat,  tinnitus and trouble swallowing.   Eyes:  Negative for pain, discharge, redness, itching and visual disturbance.  Respiratory:  Positive for cough and wheezing. Negative for chest tightness and shortness of breath.   Cardiovascular:  Negative for chest pain, palpitations and leg swelling.   Gastrointestinal:  Negative for abdominal distention, abdominal pain, blood in stool, constipation, diarrhea, nausea and vomiting.  Skin:  Negative for color change, pallor and rash.  Neurological:  Negative for dizziness, light-headedness and headaches.    Immunization History  Administered Date(s) Administered   Fluad Quad(high Dose 65+) 12/15/2020, 12/26/2021   Influenza, High Dose Seasonal PF 02/13/2018, 01/06/2019   Influenza-Unspecified 12/31/2016   Moderna Sars-Covid-2 Vaccination 12/22/2021   PFIZER(Purple Top)SARS-COV-2 Vaccination 04/29/2019, 05/20/2019, 01/04/2020, 07/20/2020, 12/18/2020   Pneumococcal Conjugate-13 01/17/2017   Pneumococcal Polysaccharide-23 04/09/2019   Tdap 03/14/2020   Pertinent  Health Maintenance Due  Topic Date Due   Colonoscopy  08/22/2022   INFLUENZA VACCINE  11/01/2022   MAMMOGRAM  02/16/2023   DEXA SCAN  Completed      12/09/2020    8:25 AM 12/15/2020    9:04 AM 01/04/2021    7:17 AM 12/18/2021    9:21 AM 12/26/2021   10:08 AM  Fall Risk  Falls in the past year? 0 0  0 0  Was there an injury with Fall? 0 0  0 0  Fall Risk Category Calculator 0 0  0 0  Fall Risk Category (Retired) Low Low  Low Low  (RETIRED) Patient Fall Risk Level Low fall risk Low fall risk Moderate fall risk Low fall risk Low fall risk  Patient at Risk for Falls Due to No Fall Risks No Fall Risks  No Fall Risks No Fall Risks  Fall risk Follow up Falls evaluation completed Falls evaluation completed  Falls evaluation completed Falls evaluation completed   Functional Status Survey:    Vitals:   09/05/22 1336  BP: 118/68  Pulse: 94  Resp: 16  Temp: (!) 97.4 F (36.3 C)  SpO2: 97%  Weight: 103 lb (46.7 kg)  Height: 5\' 4"  (1.626 m)   Body mass index is 17.68 kg/m. Physical Exam Vitals reviewed.  Constitutional:      General: She is not in acute distress.    Appearance: Normal appearance. She is underweight. She is not ill-appearing or diaphoretic.  HENT:      Head: Normocephalic.     Nose: Nose normal. No congestion or rhinorrhea.     Mouth/Throat:     Mouth: Mucous membranes are moist.     Pharynx: Oropharynx is clear. No oropharyngeal exudate or posterior oropharyngeal erythema.  Neck:     Vascular: No carotid bruit.  Cardiovascular:     Rate and Rhythm: Normal rate and regular rhythm.     Pulses: Normal pulses.     Heart sounds: Normal heart sounds. No murmur heard.    No friction rub. No gallop.  Pulmonary:     Effort: Pulmonary effort is normal. No respiratory distress.     Breath sounds: Normal breath sounds. No wheezing, rhonchi or rales.  Chest:     Chest wall: No tenderness.  Abdominal:     General: Bowel sounds are normal. There is no distension.     Palpations: Abdomen is soft. There is no mass.     Tenderness: There is no abdominal tenderness. There is no right CVA tenderness, left CVA tenderness, guarding or rebound.  Musculoskeletal:        General: No swelling or  tenderness. Normal range of motion.     Cervical back: Normal range of motion. No rigidity or tenderness.     Right lower leg: No edema.     Left lower leg: No edema.  Lymphadenopathy:     Cervical: No cervical adenopathy.  Skin:    General: Skin is warm and dry.     Coloration: Skin is not pale.     Findings: No bruising, erythema, lesion or rash.  Neurological:     Mental Status: She is alert and oriented to person, place, and time.     Cranial Nerves: No cranial nerve deficit.     Sensory: No sensory deficit.     Motor: No weakness.     Coordination: Coordination normal.     Gait: Gait normal.  Psychiatric:        Mood and Affect: Mood normal.        Speech: Speech normal.        Behavior: Behavior normal.     Labs reviewed: Recent Labs    12/27/21 0805  NA 137  K 5.4*  CL 99  CO2 25  GLUCOSE 85  BUN 12  CREATININE 0.75  CALCIUM 9.9   Recent Labs    12/27/21 0805  AST 19  ALT 12  BILITOT 0.7  PROT 7.5   Recent Labs     12/27/21 0805  WBC 6.5  NEUTROABS 2,717  HGB 16.8*  HCT 50.4*  MCV 91.3  PLT 316   Lab Results  Component Value Date   TSH 2.42 12/12/2020   No results found for: "HGBA1C" Lab Results  Component Value Date   CHOL 260 (H) 12/27/2021   HDL 87 12/27/2021   LDLCALC 151 (H) 12/27/2021   TRIG 103 12/27/2021   CHOLHDL 3.0 12/27/2021    Significant Diagnostic Results in last 30 days:  No results found.  Assessment/Plan  Mild intermittent asthma, unspecified whether complicated Afebrile No wheezing or shortness of breath noted during exam. - will refill Albuterol.advised to discard expired albuterol since it's ineffective. - Advised to use Symbicort twice daily instead of once a [place] or PRN  - Notify provider if symptoms worsen or fail to improve  Family/ staff Communication: Reviewed plan of care with patient and Husband verbalized understanding   Labs/tests ordered: None   Next Appointment:Return if symptoms worsen or fail to improve.    Caesar Bookman, NP

## 2022-11-13 ENCOUNTER — Other Ambulatory Visit: Payer: Self-pay | Admitting: Family

## 2022-12-04 IMAGING — CT CT KNEE*R* W/O CM
3 series · 14 of 33 positions shown, 17 images · non-contrast
Comparison: None. Findings are correlated with plain radiographs
but completed at [DATE] p.m.

CLINICAL DATA: Tibial plateau fracture

EXAM:
CT OF THE RIGHT KNEE WITHOUT CONTRAST
TECHNIQUE: Multidetector CT imaging of the RIGHT knee was performed according
to the standard protocol. Multiplanar CT image reconstructions were
also generated.

[Series 4: lower ext 1.5 st · axial · 0.35mm/px · z∈[+488,+638]mm · 6 of 132 slices shown, 8 images]
[im 21/132  soft-tissue]
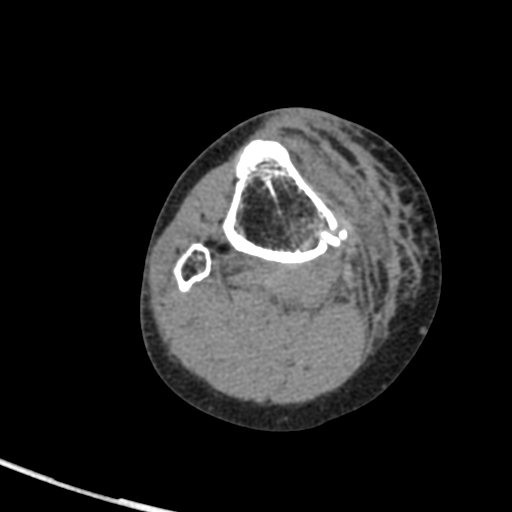
[im 21/132  bone]
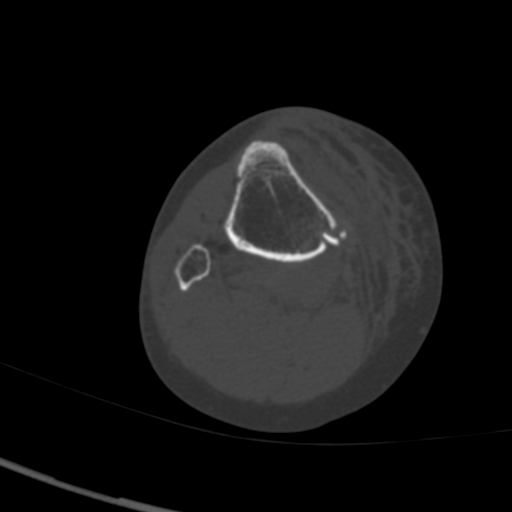
[im 41/132  bone]
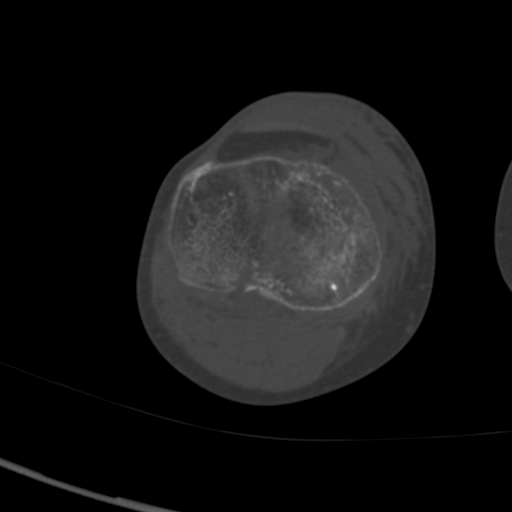
[im 61/132  bone]
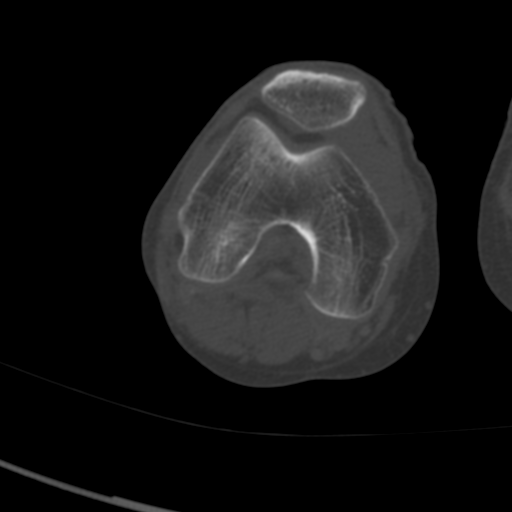
[im 81/132  bone]
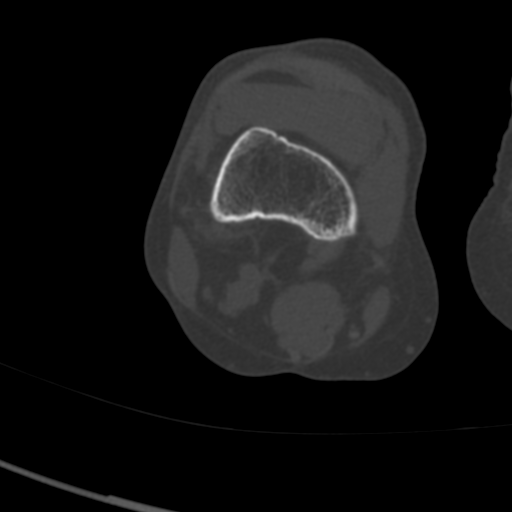
[im 101/132  soft-tissue]
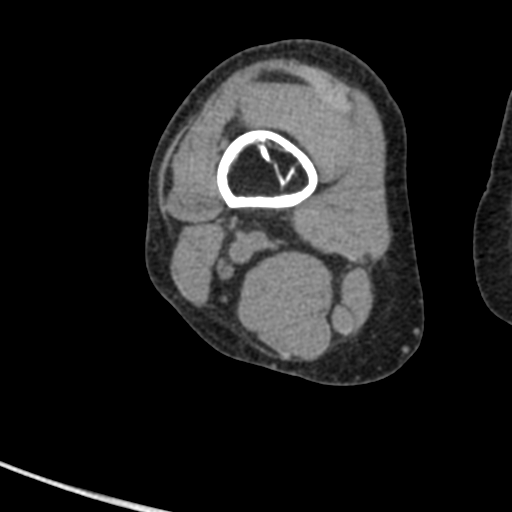
[im 101/132  bone]
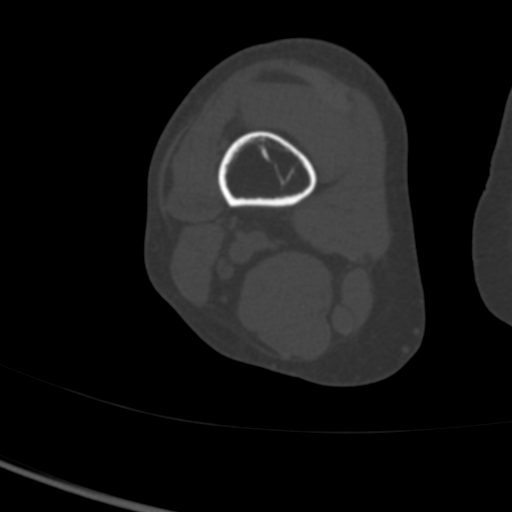
[im 121/132  bone]
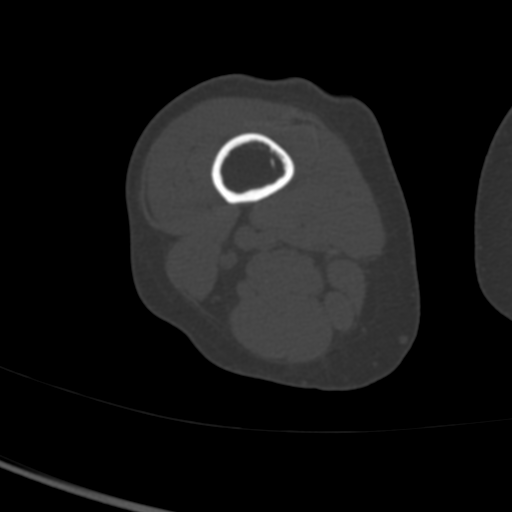

[Series 9: lower ext cor st · coronal · 0.26mm/px · 3 of 102 slices shown]
[im 21/102  bone]
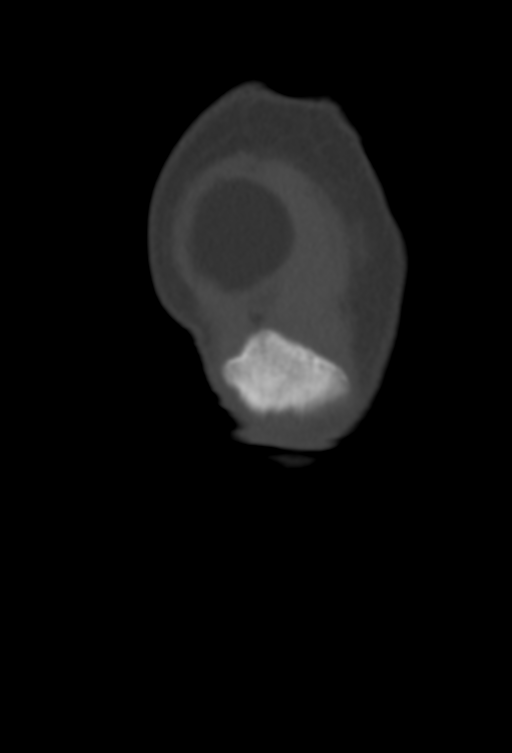
[im 41/102  bone]
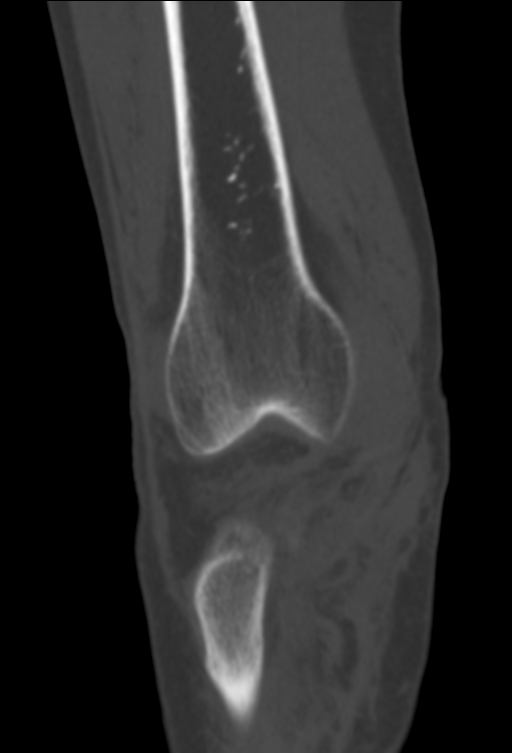
[im 61/102  bone]
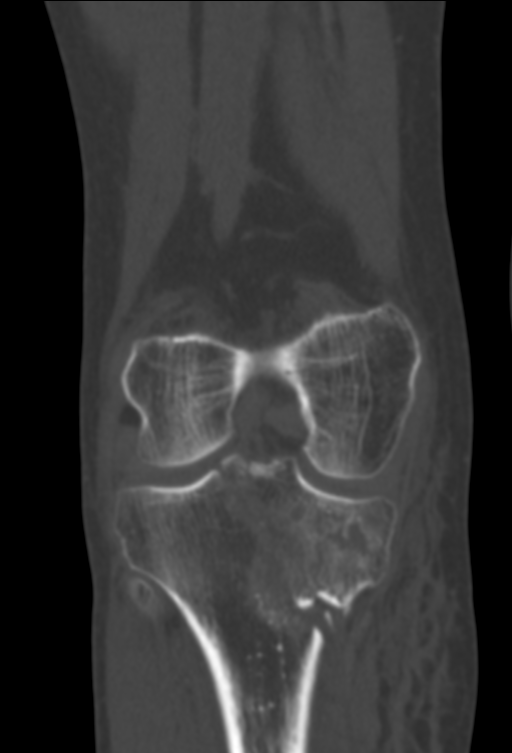

[Series 10: lower ext sag st · sagittal · 0.37mm/px · 5 of 84 slices shown, 6 images]
[im 28/84  bone]
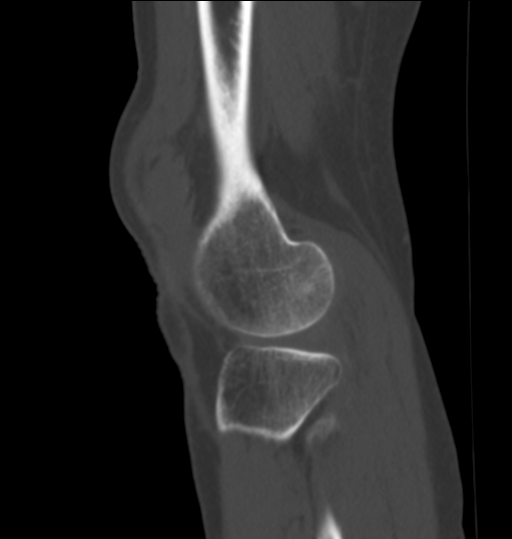
[im 35/84  bone]
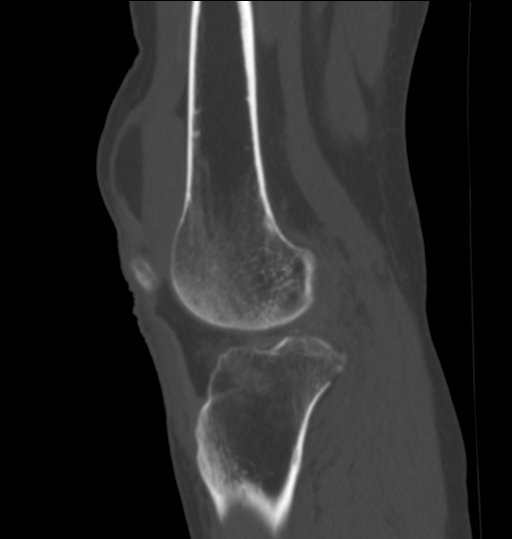
[im 42/84  soft-tissue]
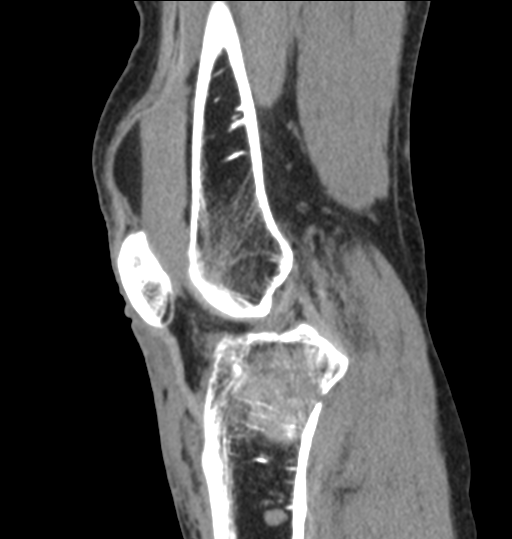
[im 42/84  bone]
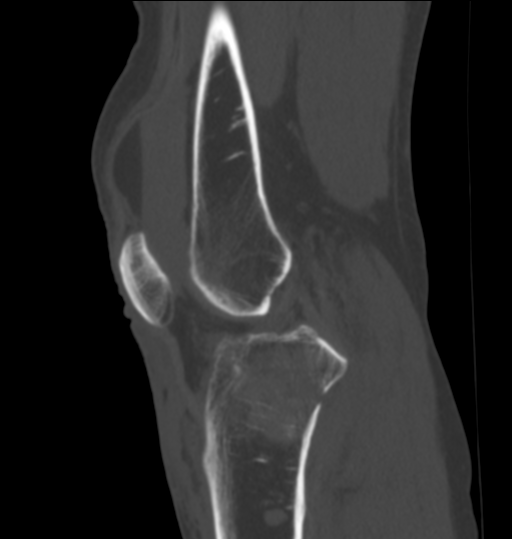
[im 49/84  bone]
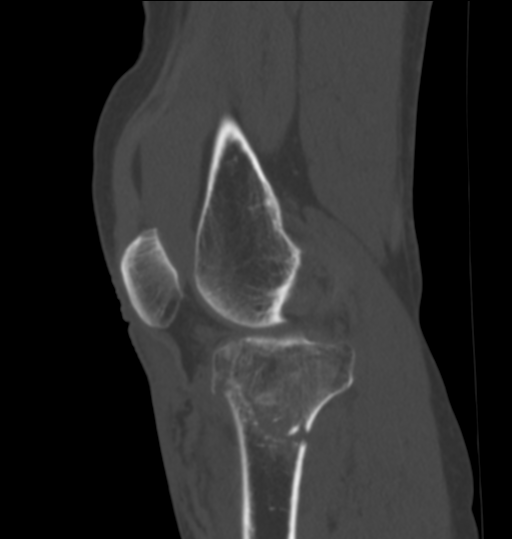
[im 56/84  bone]
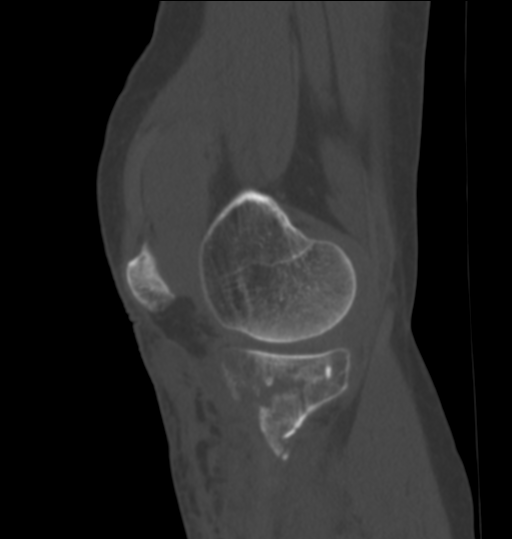

[14 of 33 positions shown; findings below may reference images not displayed]

FINDINGS: Bones/Joint/Cartilage

There is a mildly comminuted medial tibial plateau fracture
identified with approximately 1 cortical with medial displacement of
the a medial tibial plateau. The dominant fracture plane extends
obliquely from the medial cortex to the a intercondylar region in
the region of the lateral intercondylar spine. The articular surface
appears congruent and there is no significant depression of the
articular surface noted. There is mild medial and lateral
compartment joint space narrowing incidentally noted in keeping with
changes of mild superimposed degenerative arthritis. Patellofemoral
joint space is preserved.

Distal femur is intact. Patella is intact. Visualized proximal
fibula is intact. Proximal tibia fibular articulation is intact.
Normal overall alignment.

Ligaments

Suboptimally assessed by CT.

Muscles and Tendons

Quadriceps and patellar tendons are intact. Visualized muscular
structures are unremarkable

Soft tissues

Large lipohemarthrosis noted. Subcutaneous edema and interstitial
hemorrhage noted within the a medial soft tissues. No loculated
subcutaneous hematoma identified.
IMPRESSION: Mildly comminuted minimally displaced medial tibial plateau fracture
without articular depression or incongruity. Large associated
lipohemarthrosis.
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
## 2022-12-20 ENCOUNTER — Encounter: Payer: Medicare Other | Admitting: Family

## 2022-12-24 ENCOUNTER — Encounter: Payer: Self-pay | Admitting: Family

## 2022-12-24 ENCOUNTER — Telehealth (INDEPENDENT_AMBULATORY_CARE_PROVIDER_SITE_OTHER): Payer: Medicare Other | Admitting: Family

## 2022-12-24 VITALS — Ht 64.0 in | Wt 108.0 lb

## 2022-12-24 DIAGNOSIS — Z0000 Encounter for general adult medical examination without abnormal findings: Secondary | ICD-10-CM | POA: Diagnosis not present

## 2022-12-24 DIAGNOSIS — Z1211 Encounter for screening for malignant neoplasm of colon: Secondary | ICD-10-CM

## 2022-12-24 NOTE — Progress Notes (Addendum)
This service is provided via telemedicine  No vital signs collected/recorded due to the encounter was a telemedicine visit.   Location of patient (ex: home, work):  home  Patient consents to a telephone visit:  yes  Location of the provider (ex: office, home):  office  Name of any referring provider:  N/A  Names of all persons participating in the telemedicine service and their role in the encounter:  Richarda Blade, NP and Clydie Braun Rossy  Time spent on call:  10 minutes     Subjective:   Rebecca [name] is a 72 y.o. female who presents for Medicare Annual (Subsequent) preventive examination.  Visit Complete: Virtual  I connected with  Rebecca [name] on 12/24/22 by a video and audio enabled telemedicine application and verified that I am speaking with the correct person using two identifiers.  Patient Location: Home  Provider Location: Office/Clinic  I discussed the limitations of evaluation and management by telemedicine. The patient expressed understanding and agreed to proceed.  Patient Medicare AWV questionnaire was completed by the patient on 12/24/2022; I have confirmed that all information answered by patient is correct and no changes since this date.        Objective:    Today's Vitals   12/24/22 1414  Weight: 108 lb (49 kg)  Height: 5\' 4"  (1.626 m)   Body mass index is 18.54 kg/m.     12/24/2022    2:13 PM 12/26/2021   10:08 AM 12/18/2021    9:24 AM 01/04/2021    7:19 AM 12/15/2020    9:04 AM 12/09/2020    8:30 AM 03/24/2020    1:08 PM  Advanced Directives  Does Patient Have a Medical Advance Directive? No No No No No No No  Does patient want to make changes to medical advance directive?       No - Patient declined  Would patient like information on creating a medical advance directive?  No - Patient declined No - Patient declined No - Patient declined No - Patient declined No - Patient declined     Current Medications (verified) Outpatient Encounter Medications as of  12/24/2022  Medication Sig   albuterol (VENTOLIN HFA) 108 (90 Base) MCG/ACT inhaler Inhale 1 puff into the lungs every 6 (six) hours as needed for wheezing or shortness of breath.   budesonide-formoterol (SYMBICORT) 80-4.5 MCG/ACT inhaler INHALE 2 PUFFS BY MOUTH TWICE A Bradt   fexofenadine (ALLEGRA) 180 MG tablet Take 1 tablet (180 mg total) by mouth daily.   rosuvastatin (CRESTOR) 5 MG tablet TAKE 1 TABLET (5 MG TOTAL) BY MOUTH DAILY.   No facility-administered encounter medications on file as of 12/24/2022.    Allergies (verified) Patient has no known allergies.   History: Past Medical History:  Diagnosis Date   Allergy    seasonal   Asthma    Osteoporosis    Other hyperlipidemia    Pneumonia    Seasonal allergies    Shingles    Past Surgical History:  Procedure Laterality Date   OPEN REDUCTION INTERNAL FIXATION (ORIF) DISTAL RADIAL FRACTURE Left 01/04/2021   Procedure: OPEN REDUCTION INTERNAL FIXATION (ORIF) DISTAL RADIAL FRACTURE;  Surgeon: Roby Lofts, MD;  Location: [facility] OR;  Service: Orthopedics;  Laterality: Left;   ORIF TIBIA PLATEAU Right 03/16/2020   Procedure: OPEN REDUCTION INTERNAL FIXATION (ORIF) TIBIAL PLATEAU;  Surgeon: Roby Lofts, MD;  Location: [facility] OR;  Service: Orthopedics;  Laterality: Right;   WISDOM TOOTH EXTRACTION     Family  History  Problem Relation Age of Onset   Cancer - Colon Mother 4   Osteoporosis Mother    Bipolar disorder Mother    Mental illness Mother    Colon cancer Mother    Other Father 51       airplane accident   Anxiety disorder Daughter 4   Diabetes Mellitus II Paternal Grandfather    Esophageal cancer Neg Hx    Stomach cancer Neg Hx    Rectal cancer Neg Hx    Social History   Socioeconomic History   Marital status: Single    Spouse name: Not on file   Number of children: Not on file   Years of education: Not on file   Highest education level: Not on file  Occupational History   Not on file  Tobacco Use   Smoking  status: Never   Smokeless tobacco: Never  Vaping Use   Vaping status: Never Used  Substance and Sexual Activity   Alcohol use: Yes    Alcohol/week: 5.0 standard drinks of alcohol    Types: 5 Glasses of wine per week   Drug use: Never   Sexual activity: Not Currently  Other Topics Concern   Not on file  Social History Narrative   ** Merged History Encounter **       Social History  Diet? No beef or pork  Do you drink/eat things with caffeine? Tea  Marital status?                 married                   What year were you married? 77-94, 98 - current  Do you live in a house, apartment, assisted living, cond   o, trailer, etc.? apartment  Is it one or more stories? 4th  How many persons live in your home? 2  Do you have any pets in your home? (please list) no  Highest level of education completed? MDIV  Current or past profession: Sports coach   or  Do you exercise?            some                           Type & how often? Walk, yoga 3 x wk  Advanced Directives  Do you have a living will? yes  Do you have a DNR form?          yes                        If not, do you want to discuss on   e?  Do you have signed POA/HPOA for forms? yes  Functional Status  Do you have difficulty bathing or dressing yourself? no  Do you have difficulty preparing food or eating? no  Do you have difficulty managing your medications? no  Do you have    difficulty managing your finances? No   Do you have difficulty affording your medications? No     Social Determinants of Corporate investment banker Strain: Not on file  Food Insecurity: Not on file  Transportation Needs: Not on file  Physical Activity: Not on file  Stress: Not on file  Social Connections: Not on file    Tobacco Counseling Counseling given: Not Answered   Clinical Intake:  Pre-visit preparation completed: No  Pain : No/denies pain  BMI - recorded: 17.68 Nutritional Status: BMI <19   Underweight Nutritional Risks: None Diabetes: No  How often do you need to have someone help you when you read instructions, pamphlets, or other written materials from your doctor or pharmacy?: 1 - Never What is the last grade level you completed in school?: Master degree  Interpreter Needed?: No      Activities of Daily Living     No data to display          Patient Care Team: Ngetich, Donalee Citrin, NP as PCP - General (Family Medicine) Sharon Seller, NP (Geriatric Medicine)  Indicate any recent Medical Services you may have received from other than [facility] providers in the past year (date may be approximate).     Assessment:   This is a routine wellness examination for Lonny.  Hearing/Vision screen No results found.   Goals Addressed             This Visit's Progress    Gain weight   Not on track    I want to gain some weight 110 lbs  2. Eat healthy diet to keep cholesterol       Patient Stated       Maintain or gain weight      Patient Stated       Build up body weight        Depression Screen    12/24/2022    2:12 PM 12/18/2021    9:21 AM 12/09/2020    8:25 AM 05/30/2020    9:14 AM 12/04/2019    8:48 AM 09/25/2018   10:24 AM 09/25/2018   10:23 AM  PHQ 2/9 Scores  PHQ - 2 Score 0 0 0 0 0 0 0    Fall Risk    12/24/2022    2:12 PM 12/26/2021   10:08 AM 12/18/2021    9:21 AM 12/15/2020    9:04 AM 12/09/2020    8:25 AM  Fall Risk   Falls in the past year? 0 0 0 0 0  Number falls in past yr:  0 0 0 0  Injury with Fall?  0 0 0 0  Risk for fall due to :  No Fall Risks No Fall Risks No Fall Risks No Fall Risks  Follow up  Falls evaluation completed Falls evaluation completed Falls evaluation completed Falls evaluation completed    MEDICARE RISK AT HOME: Medicare Risk at Home Any stairs in or around the home?: Yes If so, are there any without handrails?: No Home free of loose throw rugs in walkways, pet beds, electrical cords, etc?: No Adequate lighting  in your home to reduce risk of falls?: Yes Life alert?: No Use of a cane, walker or w/c?: No Grab bars in the bathroom?: No Shower chair or bench in shower?: No Elevated toilet seat or a handicapped toilet?: No  TIMED UP AND GO:  Was the test performed?  No    Cognitive Function:    12/24/2022    2:14 PM 09/25/2018   10:24 AM  MMSE - Mini Mental State Exam  Orientation to time 5 4  Orientation to Place 5 5  Registration 3 3  Attention/ Calculation 5 5  Recall 3 3  Language- name 2 objects 2 2  Language- repeat 1 1  Language- follow 3 step command 3 3  Language- read & follow direction 1 1  Write a sentence 1 1  Copy design 0 1  Total score 29 29  12/18/2021    9:22 AM 12/09/2020    8:26 AM 12/04/2019    8:49 AM  6CIT Screen  What Year? 0 points 0 points 0 points  What month? 0 points 0 points 0 points  What time? 0 points 0 points 0 points  Count back from 20 0 points 0 points 0 points  Months in reverse 0 points 0 points 0 points  Repeat phrase 2 points 0 points 0 points  Total Score 2 points 0 points 0 points    Immunizations Immunization History  Administered Date(s) Administered   Fluad Quad(high Dose 65+) 12/15/2020, 12/26/2021, 12/21/2022   Influenza, High Dose Seasonal PF 02/13/2018, 01/06/2019   Influenza-Unspecified 12/31/2016   Moderna Covid-19 Fall Seasonal Vaccine 88yrs & older 12/21/2022   Moderna Sars-Covid-2 Vaccination 12/22/2021   PFIZER(Purple Top)SARS-COV-2 Vaccination 04/29/2019, 05/20/2019, 01/04/2020, 07/20/2020, 12/18/2020   Pneumococcal Conjugate-13 01/17/2017   Pneumococcal Polysaccharide-23 04/09/2019   Tdap 03/14/2020    TDAP status: Up to date  Flu Vaccine status: Up to date  Pneumococcal vaccine status: Up to date  Covid-19 vaccine status: Completed vaccines  Qualifies for Shingles Vaccine? No   Zostavax completed No   Shingrix Completed?: No.    Education has been provided regarding the importance of this vaccine.  Patient has been advised to call insurance company to determine out of pocket expense if they have not yet received this vaccine. Advised may also receive vaccine at local pharmacy or Health Dept. Verbalized acceptance and understanding.  Screening Tests Health Maintenance  Topic Date Due   Zoster Vaccines- Shingrix (1 of 2) Never done   Colonoscopy  08/22/2022   MAMMOGRAM  02/16/2023   COVID-19 Vaccine (8 - 2023-24 season) 04/22/2023   Medicare Annual Wellness (AWV)  12/24/2023   DTaP/Tdap/Td (2 - Td or Tdap) 03/14/2030   Pneumonia Vaccine 9+ Years old  Completed   INFLUENZA VACCINE  Completed   DEXA SCAN  Completed   Hepatitis C Screening  Completed   HPV VACCINES  Aged Out    Health Maintenance  Health Maintenance Due  Topic Date Due   Zoster Vaccines- Shingrix (1 of 2) Never done   Colonoscopy  08/22/2022    Colorectal cancer screening: Referral to GI placed Ordered . Pt aware the office will call re: appt.  Mammogram status: Completed 02/15/2022. Repeat every year  Bone Density status: Completed 5/16/20219. Results reflect: Bone density results: OSTEOPOROSIS. Repeat every 2 years.  Lung Cancer Screening: (Low Dose CT Chest recommended if Age 43-80 years, 20 pack-year currently smoking OR have quit w/in 15years.) does not qualify.   Lung Cancer Screening Referral: No   Additional Screening:  Hepatitis C Screening: does qualify; Completed Yes   Vision Screening: Recommended annual ophthalmology exams for early detection of glaucoma and other disorders of the eye. Is the patient up to date with their annual eye exam?  Yes  Who is the provider or what is the name of the office in which the patient attends annual eye exams? Dr.Fox  If pt is not established with a provider, would they like to be referred to a provider to establish care? No .   Dental Screening: Recommended annual dental exams for proper oral hygiene  Diabetic Foot Exam: Diabetic Foot Exam: Completed N/A    Community Resource Referral / Chronic Care Management: CRR required this visit?  No   CCM required this visit?  No     Plan:     I have personally reviewed and noted the following  in the patient's chart:   Medical and social history Use of alcohol, tobacco or illicit drugs  Current medications and supplements including opioid prescriptions. Patient is not currently taking opioid prescriptions. Functional ability and status Nutritional status Physical activity Advanced directives List of other physicians Hospitalizations, surgeries, and ER visits in previous 12 months Vitals Screenings to include cognitive, depression, and falls Referrals and appointments  In addition, I have reviewed and discussed with patient certain preventive protocols, quality metrics, and best practice recommendations. A written personalized care plan for preventive services as well as general preventive health recommendations were provided to patient.     Caesar Bookman, NP   12/24/2022   After Visit Summary: (MyChart) Due to this being a telephonic visit, the after visit summary with patients personalized plan was offered to patient via MyChart   Nurse Notes: Advised to get shingles vaccine at the pharmacy

## 2022-12-24 NOTE — Patient Instructions (Addendum)
Rebecca [name] , Thank you for taking time to come for your Medicare Wellness Visit. I appreciate your ongoing commitment to your health goals. Please review the following plan we discussed and let me know if I can assist you in the future.   Screening recommendations/referrals: Colonoscopy : Ordered this visit  Mammogram : Up to date  Bone Density  : Up to date  Recommended yearly ophthalmology/optometry visit for glaucoma screening and checkup Recommended yearly dental visit for hygiene and checkup  Vaccinations: Influenza vaccine- due annually in September/October Pneumococcal vaccine  : Up to date  Tdap vaccine  : Up to date  Shingles vaccine : Due     Advanced directives: No   Conditions/risks identified: Advance age  Female > 65 yrs  Next appointment: 1 year    Preventive Care 37 Years and Older, Female Preventive care refers to lifestyle choices and visits with your health care provider that can promote health and wellness. What does preventive care include? A yearly physical exam. This is also called an annual well check. Dental exams once or twice a year. Routine eye exams. Ask your health care provider how often you should have your eyes checked. Personal lifestyle choices, including: Daily care of your teeth and gums. Regular physical activity. Eating a healthy diet. Avoiding tobacco and drug use. Limiting alcohol use. Practicing safe sex. Taking low-dose aspirin every [place]. Taking vitamin and mineral supplements as recommended by your health care provider. What happens during an annual well check? The services and screenings done by your health care provider during your annual well check will depend on your age, overall health, lifestyle risk factors, and family history of disease. Counseling  Your health care provider may ask you questions about your: Alcohol use. Tobacco use. Drug use. Emotional well-being. Home and relationship well-being. Sexual  activity. Eating habits. History of falls. Memory and ability to understand (cognition). Work and work Astronomer. Reproductive health. Screening  You may have the following tests or measurements: Height, weight, and BMI. Blood pressure. Lipid and cholesterol levels. These may be checked every 5 years, or more frequently if you are over 8 years old. Skin check. Lung cancer screening. You may have this screening every year starting at age 54 if you have a 30-pack-year history of smoking and currently smoke or have quit within the past 15 years. Fecal occult blood test (FOBT) of the stool. You may have this test every year starting at age 57. Flexible sigmoidoscopy or colonoscopy. You may have a sigmoidoscopy every 5 years or a colonoscopy every 10 years starting at age 29. Hepatitis C blood test. Hepatitis B blood test. Sexually transmitted disease (STD) testing. Diabetes screening. This is done by checking your blood sugar (glucose) after you have not eaten for a while (fasting). You may have this done every 1-3 years. Bone density scan. This is done to screen for osteoporosis. You may have this done starting at age 51. Mammogram. This may be done every 1-2 years. Talk to your health care provider about how often you should have regular mammograms. Talk with your health care provider about your test results, treatment options, and if necessary, the need for more tests. Vaccines  Your health care provider may recommend certain vaccines, such as: Influenza vaccine. This is recommended every year. Tetanus, diphtheria, and acellular pertussis (Tdap, Td) vaccine. You may need a Td booster every 10 years. Zoster vaccine. You may need this after age 50. Pneumococcal 13-valent conjugate (PCV13) vaccine. One dose is recommended  after age 35. Pneumococcal polysaccharide (PPSV23) vaccine. One dose is recommended after age 27. Talk to your health care provider about which screenings and vaccines  you need and how often you need them. This information is not intended to replace advice given to you by your health care provider. Make sure you discuss any questions you have with your health care provider. Document Released: 04/15/2015 Document Revised: 12/07/2015 Document Reviewed: 01/18/2015 Elsevier Interactive Patient Education  2017 [employer].  Fall Prevention in the Home Falls can cause injuries. They can happen to people of all ages. There are many things you can do to make your home safe and to help prevent falls. What can I do on the outside of my home? Regularly fix the edges of walkways and driveways and fix any cracks. Remove anything that might make you trip as you walk through a door, such as a raised step or threshold. Trim any bushes or trees on the path to your home. Use bright outdoor lighting. Clear any walking paths of anything that might make someone trip, such as rocks or tools. Regularly check to see if handrails are loose or broken. Make sure that both sides of any steps have handrails. Any raised decks and porches should have guardrails on the edges. Have any leaves, snow, or ice cleared regularly. Use sand or salt on walking paths during winter. Clean up any spills in your garage right away. This includes oil or grease spills. What can I do in the bathroom? Use night lights. Install grab bars by the toilet and in the tub and shower. Do not use towel bars as grab bars. Use non-skid mats or decals in the tub or shower. If you need to sit down in the shower, use a plastic, non-slip stool. Keep the floor dry. Clean up any water that spills on the floor as soon as it happens. Remove soap buildup in the tub or shower regularly. Attach bath mats securely with double-sided non-slip rug tape. Do not have throw rugs and other things on the floor that can make you trip. What can I do in the bedroom? Use night lights. Make sure that you have a light by your bed that  is easy to reach. Do not use any sheets or blankets that are too big for your bed. They should not hang down onto the floor. Have a firm chair that has side arms. You can use this for support while you get dressed. Do not have throw rugs and other things on the floor that can make you trip. What can I do in the kitchen? Clean up any spills right away. Avoid walking on wet floors. Keep items that you use a lot in easy-to-reach places. If you need to reach something above you, use a strong step stool that has a grab bar. Keep electrical cords out of the way. Do not use floor polish or wax that makes floors slippery. If you must use wax, use non-skid floor wax. Do not have throw rugs and other things on the floor that can make you trip. What can I do with my stairs? Do not leave any items on the stairs. Make sure that there are handrails on both sides of the stairs and use them. Fix handrails that are broken or loose. Make sure that handrails are as long as the stairways. Check any carpeting to make sure that it is firmly attached to the stairs. Fix any carpet that is loose or worn. Avoid having throw  rugs at the top or bottom of the stairs. If you do have throw rugs, attach them to the floor with carpet tape. Make sure that you have a light switch at the top of the stairs and the bottom of the stairs. If you do not have them, ask someone to add them for you. What else can I do to help prevent falls? Wear shoes that: Do not have high heels. Have rubber bottoms. Are comfortable and fit you well. Are closed at the toe. Do not wear sandals. If you use a stepladder: Make sure that it is fully opened. Do not climb a closed stepladder. Make sure that both sides of the stepladder are locked into place. Ask someone to hold it for you, if possible. Clearly mark and make sure that you can see: Any grab bars or handrails. First and last steps. Where the edge of each step is. Use tools that help you  move around (mobility aids) if they are needed. These include: Canes. Walkers. Scooters. Crutches. Turn on the lights when you go into a dark area. Replace any light bulbs as soon as they burn out. Set up your furniture so you have a clear path. Avoid moving your furniture around. If any of your floors are uneven, fix them. If there are any pets around you, be aware of where they are. Review your medicines with your doctor. Some medicines can make you feel dizzy. This can increase your chance of falling. Ask your doctor what other things that you can do to help prevent falls. This information is not intended to replace advice given to you by your health care provider. Make sure you discuss any questions you have with your health care provider. Document Released: 01/13/2009 Document Revised: 08/25/2015 Document Reviewed: 04/23/2014 Elsevier Interactive Patient Education  2017 [employer].

## 2022-12-31 ENCOUNTER — Encounter: Payer: Medicare Other | Admitting: Family

## 2023-01-15 ENCOUNTER — Other Ambulatory Visit: Payer: Self-pay | Admitting: Family

## 2023-01-15 DIAGNOSIS — J4520 Mild intermittent asthma, uncomplicated: Secondary | ICD-10-CM

## 2023-01-15 DIAGNOSIS — E785 Hyperlipidemia, unspecified: Secondary | ICD-10-CM

## 2023-01-16 ENCOUNTER — Other Ambulatory Visit: Payer: Self-pay

## 2023-01-16 DIAGNOSIS — E785 Hyperlipidemia, unspecified: Secondary | ICD-10-CM

## 2023-01-16 DIAGNOSIS — J4520 Mild intermittent asthma, uncomplicated: Secondary | ICD-10-CM

## 2023-01-17 ENCOUNTER — Other Ambulatory Visit: Payer: Medicare Other

## 2023-01-17 DIAGNOSIS — J4520 Mild intermittent asthma, uncomplicated: Secondary | ICD-10-CM | POA: Diagnosis not present

## 2023-01-17 DIAGNOSIS — E785 Hyperlipidemia, unspecified: Secondary | ICD-10-CM | POA: Diagnosis not present

## 2023-01-18 LAB — CBC WITH DIFFERENTIAL/PLATELET
Absolute Lymphocytes: 2820 {cells}/uL (ref 850–3900)
Absolute Monocytes: 866 {cells}/uL (ref 200–950)
Basophils Absolute: 61 {cells}/uL (ref 0–200)
Basophils Relative: 0.8 %
Eosinophils Absolute: 813 {cells}/uL — ABNORMAL HIGH (ref 15–500)
Eosinophils Relative: 10.7 %
HCT: 49.2 % — ABNORMAL HIGH (ref 35.0–45.0)
Hemoglobin: 15.8 g/dL — ABNORMAL HIGH (ref 11.7–15.5)
MCH: 30.3 pg (ref 27.0–33.0)
MCHC: 32.1 g/dL (ref 32.0–36.0)
MCV: 94.4 fL (ref 80.0–100.0)
MPV: 10.8 fL (ref 7.5–12.5)
Monocytes Relative: 11.4 %
Neutro Abs: 3040 {cells}/uL (ref 1500–7800)
Neutrophils Relative %: 40 %
Platelets: 306 10*3/uL (ref 140–400)
RBC: 5.21 10*6/uL — ABNORMAL HIGH (ref 3.80–5.10)
RDW: 11.5 % (ref 11.0–15.0)
Total Lymphocyte: 37.1 %
WBC: 7.6 10*3/uL (ref 3.8–10.8)

## 2023-01-18 LAB — COMPLETE METABOLIC PANEL WITH GFR
AG Ratio: 1.9 (calc) (ref 1.0–2.5)
ALT: 14 U/L (ref 6–29)
AST: 17 U/L (ref 10–35)
Albumin: 4.6 g/dL (ref 3.6–5.1)
Alkaline phosphatase (APISO): 82 U/L (ref 37–153)
BUN: 11 mg/dL (ref 7–25)
CO2: 29 mmol/L (ref 20–32)
Calcium: 9.6 mg/dL (ref 8.6–10.4)
Chloride: 99 mmol/L (ref 98–110)
Creat: 0.74 mg/dL (ref 0.60–1.00)
Globulin: 2.4 g/dL (ref 1.9–3.7)
Glucose, Bld: 91 mg/dL (ref 65–99)
Potassium: 4.9 mmol/L (ref 3.5–5.3)
Sodium: 138 mmol/L (ref 135–146)
Total Bilirubin: 0.5 mg/dL (ref 0.2–1.2)
Total Protein: 7 g/dL (ref 6.1–8.1)
eGFR: 87 mL/min/{1.73_m2} (ref >=60)

## 2023-01-18 LAB — LIPID PANEL
Cholesterol: 175 mg/dL (ref <200)
HDL: 91 mg/dL (ref >=50)
LDL Cholesterol (Calc): 68 mg/dL
Non-HDL Cholesterol (Calc): 84 mg/dL (ref <130)
Total CHOL/HDL Ratio: 1.9 (calc) (ref <5.0)
Triglycerides: 82 mg/dL (ref <150)

## 2023-01-21 ENCOUNTER — Encounter: Payer: Self-pay | Admitting: Family

## 2023-01-21 ENCOUNTER — Ambulatory Visit (INDEPENDENT_AMBULATORY_CARE_PROVIDER_SITE_OTHER): Payer: Medicare Other | Admitting: Family

## 2023-01-21 VITALS — BP 118/78 | HR 69 | Temp 97.6°F | Resp 18 | Ht 64.0 in | Wt 102.8 lb

## 2023-01-21 DIAGNOSIS — Z681 Body mass index (BMI) 19 or less, adult: Secondary | ICD-10-CM

## 2023-01-21 DIAGNOSIS — L918 Other hypertrophic disorders of the skin: Secondary | ICD-10-CM

## 2023-01-21 DIAGNOSIS — S065X9A Traumatic subdural hemorrhage with loss of consciousness of unspecified duration, initial encounter: Secondary | ICD-10-CM | POA: Insufficient documentation

## 2023-01-21 DIAGNOSIS — E785 Hyperlipidemia, unspecified: Secondary | ICD-10-CM | POA: Diagnosis not present

## 2023-01-21 DIAGNOSIS — R636 Underweight: Secondary | ICD-10-CM

## 2023-01-21 DIAGNOSIS — J4520 Mild intermittent asthma, uncomplicated: Secondary | ICD-10-CM

## 2023-01-21 NOTE — Progress Notes (Signed)
Provider: Richarda Blade FNP-C   Xaden Kaufman, Donalee Citrin, NP  Patient Care Team: Zabria Liss, Donalee Citrin, NP as PCP - General (Family Medicine) Sharon Seller, NP (Geriatric Medicine)  Extended Emergency Contact Information Primary Emergency Contact: Coolidge,[name] Address: [address]          [place], [place] [address] D[place] of [place] Home Phone: [phone] Mobile Phone: [phone] Relation: Spouse Secondary Emergency Contact: Carnevale,[name] Home Phone: [phone] Relation: Brother  Code Status:  Full Code  Goals of care: Advanced Directive information    12/24/2022    2:13 PM  Advanced Directives  Does Patient Have a Medical Advance Directive? No     Chief Complaint  Patient presents with   Medical Management of Chronic Issues    one year physical.    Immunizations    Shingrix   Quality Metric Gaps    Colonoscopy    HPI:  Pt is a 72 y.o. female seen today for one year follow up for medical management of chronic diseases.   Recent labs reviewed and discussed during the visit.Copy of labs given.   Hyperlipidemia - fasting lipid were normal   Has been doing a lot of walking and weight lifting.   Hemoglobin 15.8 improved from 16.8 not on any iron supplement. Trending down.   Asthma - states current inhalers have been effective.she denies any She denies any cough,fatigue,body aches,runny nose,chest tightness,chest pain wheezing or shortness of breath.  Underweight - Has had weight loss 6 lbs since last visit.Has been exercising in the Gym.States appetite is good.   Due for Colonoscopy.Ordered on previous visit per specialist office called to schedule appointment message was left for patient to call back.states has a phone number will call to schedule for colonoscopy.   Immunization up to date except due for shingles vaccine.    Past Medical History:  Diagnosis Date   Allergy    seasonal   Asthma    Osteoporosis    Other hyperlipidemia    Pneumonia     Seasonal allergies    Shingles    Past Surgical History:  Procedure Laterality Date   OPEN REDUCTION INTERNAL FIXATION (ORIF) DISTAL RADIAL FRACTURE Left 01/04/2021   Procedure: OPEN REDUCTION INTERNAL FIXATION (ORIF) DISTAL RADIAL FRACTURE;  Surgeon: Roby Lofts, MD;  Location: [facility];  Service: Orthopedics;  Laterality: Left;   ORIF TIBIA PLATEAU Right 03/16/2020   Procedure: OPEN REDUCTION INTERNAL FIXATION (ORIF) TIBIAL PLATEAU;  Surgeon: Roby Lofts, MD;  Location: [facility] OR;  Service: Orthopedics;  Laterality: Right;   WISDOM TOOTH EXTRACTION      No Known Allergies  Allergies as of 01/21/2023   No Known Allergies      Medication List        Accurate as of January 21, 2023 10:41 AM. If you have any questions, ask your nurse or doctor.          albuterol 108 (90 Base) MCG/ACT inhaler Commonly known as: VENTOLIN HFA Inhale 1 puff into the lungs every 6 (six) hours as needed for wheezing or shortness of breath.   budesonide-formoterol 80-4.5 MCG/ACT inhaler Commonly known as: Symbicort INHALE 2 PUFFS BY MOUTH TWICE A [place]   fexofenadine 180 MG tablet Commonly known as: ALLEGRA Take 1 tablet (180 mg total) by mouth daily.   rosuvastatin 5 MG tablet Commonly known as: CRESTOR TAKE 1 TABLET (5 MG TOTAL) BY MOUTH DAILY.        Review of Systems  Constitutional:  Negative for appetite change, chills, fatigue, fever and unexpected weight change.  HENT:  Negative for congestion, dental problem, ear discharge, ear pain, facial swelling, hearing loss, nosebleeds, postnasal drip, rhinorrhea, sinus pressure, sinus pain, sneezing, sore throat, tinnitus and trouble swallowing.   Eyes:  Negative for pain, discharge, redness, itching and visual disturbance.  Respiratory:  Negative for cough, chest tightness, shortness of breath and wheezing.   Cardiovascular:  Negative for chest pain, palpitations and leg swelling.  Gastrointestinal:  Negative for abdominal distention,  abdominal pain, blood in stool, constipation, diarrhea, nausea and vomiting.  Endocrine: Negative for cold intolerance, heat intolerance, polydipsia, polyphagia and polyuria.  Genitourinary:  Negative for difficulty urinating, dysuria, flank pain, frequency and urgency.  Musculoskeletal:  Negative for arthralgias, back pain, gait problem, joint swelling, myalgias, neck pain and neck stiffness.  Skin:  Negative for color change, pallor, rash and wound.  Neurological:  Negative for dizziness, syncope, speech difficulty, weakness, light-headedness, numbness and headaches.  Hematological:  Does not bruise/bleed easily.  Psychiatric/Behavioral:  Negative for agitation, behavioral problems, confusion, hallucinations, self-injury, sleep disturbance and suicidal ideas. The patient is not nervous/anxious.     Immunization History  Administered Date(s) Administered   Fluad Quad(high Dose 65+) 12/15/2020, 12/26/2021, 12/21/2022   Influenza, High Dose Seasonal PF 02/13/2018, 01/06/2019   Influenza-Unspecified 12/31/2016   Moderna Covid-19 Fall Seasonal Vaccine 7yrs & older 12/21/2022   Moderna Sars-Covid-2 Vaccination 12/22/2021   PFIZER(Purple Top)SARS-COV-2 Vaccination 04/29/2019, 05/20/2019, 01/04/2020, 07/20/2020, 12/18/2020   Pneumococcal Conjugate-13 01/17/2017   Pneumococcal Polysaccharide-23 04/09/2019   Tdap 03/14/2020   Pertinent  Health Maintenance Due  Topic Date Due   Colonoscopy  08/22/2022   MAMMOGRAM  02/16/2023   INFLUENZA VACCINE  Completed   DEXA SCAN  Completed      01/04/2021    7:17 AM 12/18/2021    9:21 AM 12/26/2021   10:08 AM 12/24/2022    2:12 PM 01/21/2023   10:28 AM  Fall Risk  Falls in the past year?  0 0 0 0  Was there an injury with Fall?  0 0  0  Fall Risk Category Calculator  0 0  0  Fall Risk Category (Retired)  Low Low    (RETIRED) Patient Fall Risk Level Moderate fall risk Low fall risk Low fall risk    Patient at Risk for Falls Due to  No Fall Risks No  Fall Risks  No Fall Risks  Fall risk Follow up  Falls evaluation completed Falls evaluation completed  Falls evaluation completed   Functional Status Survey:    Vitals:   01/21/23 1021  BP: 118/78  Pulse: 69  Resp: 18  Temp: 97.6 F (36.4 C)  SpO2: 97%  Weight: 102 lb 12.8 oz (46.6 kg)  Height: 5\' 4"  (1.626 m)   Body mass index is 17.65 kg/m. Physical Exam Vitals reviewed.  Constitutional:      General: She is not in acute distress.    Appearance: Normal appearance. She is underweight. She is not ill-appearing or diaphoretic.  HENT:     Head: Normocephalic.     Right Ear: Tympanic membrane, ear canal and external ear normal. There is no impacted cerumen.     Left Ear: Tympanic membrane, ear canal and external ear normal. There is no impacted cerumen.     Nose: Nose normal. No congestion or rhinorrhea.     Mouth/Throat:     Mouth: Mucous membranes are moist.     Pharynx: Oropharynx is clear. No oropharyngeal exudate  or posterior oropharyngeal erythema.  Eyes:     General: No scleral icterus.       Right eye: No discharge.        Left eye: No discharge.     Extraocular Movements: Extraocular movements intact.     Conjunctiva/sclera: Conjunctivae normal.     Pupils: Pupils are equal, round, and reactive to light.  Neck:     Vascular: No carotid bruit.  Cardiovascular:     Rate and Rhythm: Normal rate and regular rhythm.     Pulses: Normal pulses.     Heart sounds: Normal heart sounds. No murmur heard.    No friction rub. No gallop.  Pulmonary:     Effort: Pulmonary effort is normal. No respiratory distress.     Breath sounds: Normal breath sounds. No wheezing, rhonchi or rales.  Chest:     Chest wall: No tenderness.  Abdominal:     General: Bowel sounds are normal. There is no distension.     Palpations: Abdomen is soft. There is no mass.     Tenderness: There is no abdominal tenderness. There is no right CVA tenderness, left CVA tenderness, guarding or rebound.   Musculoskeletal:        General: No swelling or tenderness. Normal range of motion.     Cervical back: Normal range of motion. No rigidity or tenderness.     Right lower leg: No edema.     Left lower leg: No edema.  Lymphadenopathy:     Cervical: No cervical adenopathy.  Skin:    General: Skin is warm and dry.     Coloration: Skin is not pale.     Findings: Lesion present. No bruising, erythema or rash.     Comments: Right neck small skin tag without any erythema or signs of infection.   Neurological:     Mental Status: She is alert and oriented to person, place, and time.     Cranial Nerves: No cranial nerve deficit.     Sensory: No sensory deficit.     Motor: No weakness.     Coordination: Coordination normal.     Gait: Gait normal.  Psychiatric:        Mood and Affect: Mood normal.        Speech: Speech normal.        Behavior: Behavior normal.        Thought Content: Thought content normal.        Judgment: Judgment normal.     Labs reviewed: Recent Labs    01/17/23 0809  NA 138  K 4.9  CL 99  CO2 29  GLUCOSE 91  BUN 11  CREATININE 0.74  CALCIUM 9.6   Recent Labs    01/17/23 0809  AST 17  ALT 14  BILITOT 0.5  PROT 7.0   Recent Labs    01/17/23 0809  WBC 7.6  NEUTROABS 3,040  HGB 15.8*  HCT 49.2*  MCV 94.4  PLT 306   Lab Results  Component Value Date   TSH 2.42 12/12/2020   No results found for: "HGBA1C" Lab Results  Component Value Date   CHOL 175 01/17/2023   HDL 91 01/17/2023   LDLCALC 68 01/17/2023   TRIG 82 01/17/2023   CHOLHDL 1.9 01/17/2023    Significant Diagnostic Results in last 30 days:  No results found.  Assessment/Plan  1. Mild intermittent asthma, unspecified whether complicated -No wheezing noted on exam -Continue on albuterol as needed and Symbicort -Continue on  Allegra  2. Hyperlipidemia, unspecified hyperlipidemia type Recent LDL at goal -Continue on rosuvastatin -Continue with dietary modification and  exercise  3. Skin tag Right neck small skin tag without any erythema or signs of infection.   4. Underweight (BMI < 18.5) BMI 17.65  - encouraged to increase protein in the diet.  - TSH; Future - COMPLETE METABOLIC PANEL WITH GFR; Future   Family/ staff Communication: Reviewed plan of care with patient verbalized understanding  Labs/tests ordered:  - CBC with Differential/Platelet - CMP with eGFR([facility]) - TSH - Lipid panel  Next Appointment : Return in about 1 year (around 01/21/2024) for medical mangement of chronic issues.Caesar Bookman, NP

## 2023-01-24 ENCOUNTER — Encounter: Payer: Self-pay | Admitting: Internal Medicine

## 2023-03-22 ENCOUNTER — Other Ambulatory Visit: Payer: Self-pay | Admitting: Family

## 2023-04-09 ENCOUNTER — Ambulatory Visit (AMBULATORY_SURGERY_CENTER): Payer: Medicare Other

## 2023-04-09 DIAGNOSIS — Z800 Family history of malignant neoplasm of digestive organs: Secondary | ICD-10-CM

## 2023-04-09 DIAGNOSIS — Z860100 Personal history of colon polyps, unspecified: Secondary | ICD-10-CM

## 2023-04-09 MED ORDER — NA SULFATE-K SULFATE-MG SULF 17.5-3.13-1.6 GM/177ML PO SOLN: 1.0000 | Freq: Once | ORAL | 0 refills | Status: AC

## 2023-04-09 NOTE — Progress Notes (Signed)
 Pre visit completed via phone call; Patient verified name, DOB, and address; No egg or soy allergy known to patient;  No issues known to pt with past sedation with any surgeries or procedures; Patient denies ever being told they had issues or difficulty with intubation;  No FH of Malignant Hyperthermia; Pt is not on diet pills; Pt is not on home 02;  Pt is not on blood thinners;  Pt denies issues with constipation;  No A fib or A flutter; Have any cardiac testing pending--NO Insurance verified during PV appt--- [facility] Medicare Pt can ambulate without assistance;  Pt denies use of chewing tobacco; Discussed diabetic/weight loss medication holds; Discussed NSAID holds; Checked BMI to be less than 50; Pt instructed to use Singlecare.com or GoodRx for a price reduction on prep;  Patient's chart reviewed by Norleen Schillings CNRA prior to previsit and patient appropriate for the [facility];  Pre visit completed and red dot placed by patient's name on their procedure [place]ra (on provider's schedule);    Instructions sent to MyChart as well as printed and mailed to the patient per her request;

## 2023-04-26 ENCOUNTER — Other Ambulatory Visit: Payer: Self-pay | Admitting: Family

## 2023-04-29 ENCOUNTER — Encounter: Payer: Self-pay | Admitting: Internal Medicine

## 2023-05-06 ENCOUNTER — Encounter: Payer: Self-pay | Admitting: Internal Medicine

## 2023-05-06 ENCOUNTER — Ambulatory Visit: Payer: Medicare Other | Admitting: Internal Medicine

## 2023-05-06 VITALS — BP 108/62 | HR 79 | Temp 97.0°F | Resp 14 | Ht 64.0 in | Wt 100.0 lb

## 2023-05-06 DIAGNOSIS — D120 Benign neoplasm of cecum: Secondary | ICD-10-CM | POA: Diagnosis not present

## 2023-05-06 DIAGNOSIS — K629 Disease of anus and rectum, unspecified: Secondary | ICD-10-CM | POA: Diagnosis not present

## 2023-05-06 DIAGNOSIS — D123 Benign neoplasm of transverse colon: Secondary | ICD-10-CM

## 2023-05-06 DIAGNOSIS — Z860101 Personal history of adenomatous and serrated colon polyps: Secondary | ICD-10-CM

## 2023-05-06 DIAGNOSIS — D124 Benign neoplasm of descending colon: Secondary | ICD-10-CM

## 2023-05-06 DIAGNOSIS — Z1211 Encounter for screening for malignant neoplasm of colon: Secondary | ICD-10-CM

## 2023-05-06 DIAGNOSIS — Z800 Family history of malignant neoplasm of digestive organs: Secondary | ICD-10-CM

## 2023-05-06 DIAGNOSIS — C20 Malignant neoplasm of rectum: Secondary | ICD-10-CM | POA: Diagnosis not present

## 2023-05-06 DIAGNOSIS — Z860100 Personal history of colon polyps, unspecified: Secondary | ICD-10-CM

## 2023-05-06 MED ORDER — SODIUM CHLORIDE 0.9 % IV SOLN: 500.0000 mL | INTRAVENOUS | Status: DC

## 2023-05-06 NOTE — Progress Notes (Signed)
 To pacu, VSS. Report to [email or web address]

## 2023-05-06 NOTE — Progress Notes (Signed)
GASTROENTEROLOGY PROCEDURE H&P NOTE   Primary Care Physician: Caesar Bookman, NP    Reason for Procedure:  Personal history of of sessile serrated polyps and history of colon cancer in patient's mother  Plan:    Colonoscopy  Patient is appropriate for endoscopic procedure(s) in the ambulatory ([facility]) setting.  The nature of the procedure, as well as the risks, benefits, and alternatives were carefully and thoroughly reviewed with the patient. Ample time for discussion and questions allowed. The patient understood, was satisfied, and agreed to proceed.     HPI: Rebecca [name] is a 73 y.o. female who presents for surveillance colonoscopy.  Medical history as below.  Tolerated the prep.  No recent chest pain or shortness of breath.  No abdominal pain today.  Past Medical History:  Diagnosis Date   Allergy    seasonal   Asthma    Osteoporosis    Other hyperlipidemia    Pneumonia    Seasonal allergies    Shingles     Past Surgical History:  Procedure Laterality Date   COLONOSCOPY  07/2017   JMP-MAC-goly(good)-tics/SSP x 1   OPEN REDUCTION INTERNAL FIXATION (ORIF) DISTAL RADIAL FRACTURE Left 01/04/2021   Procedure: OPEN REDUCTION INTERNAL FIXATION (ORIF) DISTAL RADIAL FRACTURE;  Surgeon: Roby Lofts, MD;  Location: [facility] OR;  Service: Orthopedics;  Laterality: Left;   ORIF TIBIA PLATEAU Right 03/16/2020   Procedure: OPEN REDUCTION INTERNAL FIXATION (ORIF) TIBIAL PLATEAU;  Surgeon: Roby Lofts, MD;  Location: [facility] OR;  Service: Orthopedics;  Laterality: Right;   POLYPECTOMY  2019   SSP x 1   WISDOM TOOTH EXTRACTION      Prior to Admission medications   Medication Sig Start Date End Date Taking? Authorizing Provider  budesonide-formoterol (SYMBICORT) 80-4.5 MCG/ACT inhaler INHALE 2 PUFFS BY MOUTH TWICE A Waln 08/28/22  Yes Ngetich, Dinah C, NP  fexofenadine (ALLEGRA) 180 MG tablet Take 1 tablet (180 mg total) by mouth daily. 07/10/17  Yes Sharon Seller, NP   rosuvastatin (CRESTOR) 5 MG tablet TAKE 1 TABLET BY MOUTH ONCE DAILY 04/26/23  Yes Ngetich, Dinah C, NP  albuterol (VENTOLIN HFA) 108 (90 Base) MCG/ACT inhaler Inhale 1 puff into the lungs every 6 (six) hours as needed for wheezing or shortness of breath. 09/05/22   Ngetich, Donalee Citrin, NP    Current Outpatient Medications  Medication Sig Dispense Refill   budesonide-formoterol (SYMBICORT) 80-4.5 MCG/ACT inhaler INHALE 2 PUFFS BY MOUTH TWICE A Delpozo 10.2 each 3   fexofenadine (ALLEGRA) 180 MG tablet Take 1 tablet (180 mg total) by mouth daily.     rosuvastatin (CRESTOR) 5 MG tablet TAKE 1 TABLET BY MOUTH ONCE DAILY 90 tablet 1   albuterol (VENTOLIN HFA) 108 (90 Base) MCG/ACT inhaler Inhale 1 puff into the lungs every 6 (six) hours as needed for wheezing or shortness of breath. 18 g 3   Current Facility-Administered Medications  Medication Dose Route Frequency Provider Last Rate Last Admin   0.9 %  sodium chloride infusion  500 mL Intravenous Continuous Martin Belling, Carie Caddy, MD        Allergies as of 05/06/2023   (No Known Allergies)    Family History  Problem Relation Age of Onset   Colon polyps Mother 46   Cancer - Colon Mother 2   Osteoporosis Mother    Bipolar disorder Mother    Mental illness Mother    Colon cancer Mother 4   Other Father 30       airplane  accident   Diabetes Mellitus II Paternal Grandfather    Anxiety disorder Daughter 63   Esophageal cancer Neg Hx    Stomach cancer Neg Hx    Rectal cancer Neg Hx     Social History   Socioeconomic History   Marital status: Single    Spouse name: Not on file   Number of children: Not on file   Years of education: Not on file   Highest education level: Not on file  Occupational History   Not on file  Tobacco Use   Smoking status: Never   Smokeless tobacco: Never  Vaping Use   Vaping status: Never Used  Substance and Sexual Activity   Alcohol use: Yes    Alcohol/week: 7.0 standard drinks of alcohol    Types: 7 Glasses of  wine per week   Drug use: Never   Sexual activity: Not Currently  Other Topics Concern   Not on file  Social History Narrative   ** Merged History Encounter **       Social History  Diet? No beef or pork  Do you drink/eat things with caffeine? Tea  Marital status?                 married                   What year were you married? 77-94, 98 - current  Do you live in a house, apartment, assisted living, cond   o, trailer, etc.? apartment  Is it one or more stories? 4th  How many persons live in your home? 2  Do you have any pets in your home? (please list) no  Highest level of education completed? MDIV  Current or past profession: Sports coach   or  Do you exercise?            some                           Type & how often? Walk, yoga 3 x wk  Advanced Directives  Do you have a living will? yes  Do you have a DNR form?          yes                        If not, do you want to discuss on   e?  Do you have signed POA/HPOA for forms? yes  Functional Status  Do you have difficulty bathing or dressing yourself? no  Do you have difficulty preparing food or eating? no  Do you have difficulty managing your medications? no  Do you have    difficulty managing your finances? No   Do you have difficulty affording your medications? No     Social Drivers of Corporate investment banker Strain: Not on file  Food Insecurity: Not on file  Transportation Needs: Not on file  Physical Activity: Not on file  Stress: Not on file  Social Connections: Not on file  Intimate Partner Violence: Not on file    Physical Exam: Vital signs in last 24 hours: @BP  128/73   Pulse 88   Temp (!) 97 F (36.1 C)   Ht 5\' 4"  (1.626 m)   Wt 100 lb (45.4 kg)   SpO2 95%   BMI 17.16 kg/m  GEN: NAD EYE: Sclerae anicteric ENT: MMM CV: Non-tachycardic Pulm: CTA b/l GI: Soft, NT/ND NEURO:  Alert & Oriented x 3   Erick Blinks, MD [facility]  05/06/2023 11:10  AM

## 2023-05-06 NOTE — Op Note (Signed)
[facility] Patient Name: Rebecca [name] Procedure Date: 05/06/2023 11:10 AM MRN: [id number] Endoscopist: Beverley Fiedler , MD, [id number] Age: 73 Referring MD:  Date of Birth: 04-15-1950 Gender: Female Account #: [id number] Procedure:                Colonoscopy Indications:              High risk colon cancer surveillance: Personal                            history of sessile serrated colon polyps (less than                            10 mm in size) with no dysplasia, Family history of                            colon cancer in a first-degree relative (mother),                            Last colonoscopy: May 2019 (SSP x 2) Medicines:                Monitored Anesthesia Care Procedure:                Pre-Anesthesia Assessment:                           - Prior to the procedure, a History and Physical                            was performed, and patient medications and                            allergies were reviewed. The patient's tolerance of                            previous anesthesia was also reviewed. The risks                            and benefits of the procedure and the sedation                            options and risks were discussed with the patient.                            All questions were answered, and informed consent                            was obtained. Prior Anticoagulants: The patient has                            taken no anticoagulant or antiplatelet agents. ASA                            Grade Assessment: II - A patient with mild systemic  disease. After reviewing the risks and benefits,                            the patient was deemed in satisfactory condition to                            undergo the procedure.                           After obtaining informed consent, the colonoscope                            was passed under direct vision. Throughout the                            procedure, the patient's blood  pressure, pulse, and                            oxygen saturations were monitored continuously. The                            [id number]-HQ190L Colonoscope [id number] was introduced                            through the anus and advanced to the cecum,                            identified by appendiceal orifice and ileocecal                            valve. The colonoscopy was performed without                            difficulty. The patient tolerated the procedure                            well. The quality of the bowel preparation was                            good. The ileocecal valve, appendiceal orifice, and                            rectum were photographed. Scope In: 11:20:30 AM Scope Out: 11:39:59 AM Scope Withdrawal Time: 0 hours 15 minutes 14 seconds  Total Procedure Duration: 0 hours 19 minutes 29 seconds  Findings:                 The digital rectal exam was normal.                           Two sessile polyps were found in the cecum. The                            polyps were 4 to 5 mm in size. These polyps were  removed with a cold snare. Resection and retrieval                            were complete. For hemostasis, one hemostatic clip                            was successfully placed (MR conditional). There was                            no bleeding at the end of the maneuver.                           A 5 mm polyp was found in the transverse colon. The                            polyp was sessile. The polyp was removed with a                            cold snare. Resection and retrieval were complete.                           A 4 mm polyp was found in the descending colon. The                            polyp was sessile. The polyp was removed with a                            saline injection-lift technique using a cold snare.                            Resection and retrieval were complete.                           An area of nodular  mucosa was found in the distal                            rectum. Biopsies were taken with a cold forceps for                            histology.                           Multiple small-mouthed diverticula were found in                            the sigmoid colon, descending colon and hepatic                            flexure.                           No additional abnormalities were found on  retroflexion. Complications:            No immediate complications. Estimated Blood Loss:     Estimated blood loss was minimal. Impression:               - Two 4 to 5 mm polyps in the cecum, removed with a                            cold snare. Resected and retrieved. Clip (MR                            conditional) was placed.                           - One 5 mm polyp in the transverse colon, removed                            with a cold snare. Resected and retrieved.                           - One 4 mm polyp in the descending colon, removed                            using injection-lift and a cold snare. Resected and                            retrieved.                           - Nodular mucosa in the distal rectum (query prior                            hemorrhoid intervention). Biopsied to exclude                            adenoma.                           - Moderate diverticulosis in the sigmoid colon, in                            the descending colon and at the hepatic flexure. Recommendation:           - Patient has a contact number available for                            emergencies. The signs and symptoms of potential                            delayed complications were discussed with the                            patient. Return to normal activities tomorrow.                            Written  discharge instructions were provided to the                            patient.                           - Resume previous diet.                            - Continue present medications.                           - Await pathology results.                           - Repeat colonoscopy is recommended for                            surveillance. The colonoscopy date will be                            determined after pathology results from today's                            exam become available for review. Beverley Fiedler, MD 05/06/2023 11:45:16 AM This report has been signed electronically.

## 2023-05-06 NOTE — Patient Instructions (Signed)
Educational handout provided to patient related to Polyps and Diverticulosis  Resume previous diet  CLIP CAR GIVEN  Continue present medications  Awaiting pathology results  YOU HAD AN ENDOSCOPIC PROCEDURE TODAY AT THE [facility]:   Refer to the procedure report that was given to you for any specific questions about what was found during the examination.  If the procedure report does not answer your questions, please call your gastroenterologist to clarify.  If you requested that your care partner not be given the details of your procedure findings, then the procedure report has been included in a sealed envelope for you to review at your convenience later.  YOU SHOULD EXPECT: Some feelings of bloating in the abdomen. Passage of more gas than usual.  Walking can help get rid of the air that was put into your GI tract during the procedure and reduce the bloating. If you had a lower endoscopy (such as a colonoscopy or flexible sigmoidoscopy) you may notice spotting of blood in your stool or on the toilet paper. If you underwent a bowel prep for your procedure, you may not have a normal bowel movement for a few days.  Please Note:  You might notice some irritation and congestion in your nose or some drainage.  This is from the oxygen used during your procedure.  There is no need for concern and it should clear up in a [place] or so.  SYMPTOMS TO REPORT IMMEDIATELY:  Following lower endoscopy (colonoscopy or flexible sigmoidoscopy):  Excessive amounts of blood in the stool  Significant tenderness or worsening of abdominal pains  Swelling of the abdomen that is new, acute  Fever of 100F or higher  For urgent or emergent issues, a gastroenterologist can be reached at any hour by calling [phone]. Do not use MyChart messaging for urgent concerns.    DIET:  We do recommend a small meal at first, but then you may proceed to your regular diet.  Drink plenty of fluids but you should  avoid alcoholic beverages for 24 hours.  ACTIVITY:  You should plan to take it easy for the rest of today and you should NOT DRIVE or use heavy machinery until tomorrow (because of the sedation medicines used during the test).    FOLLOW UP: Our staff will call the number listed on your records the next business [place] following your procedure.  We will call around 7:15- 8:00 am to check on you and address any questions or concerns that you may have regarding the information given to you following your procedure. If we do not reach you, we will leave a message.     If any biopsies were taken you will be contacted by phone or by letter within the next 1-3 weeks.  Please call us at [phone] if you have not heard about the biopsies in 3 weeks.    SIGNATURES/CONFIDENTIALITY: You and/or your care partner have signed paperwork which will be entered into your electronic medical record.  These signatures attest to the fact that that the information above on your After Visit Summary has been reviewed and is understood.  Full responsibility of the confidentiality of this discharge information lies with you and/or your care-partner.

## 2023-05-06 NOTE — Progress Notes (Signed)
 Called to room to assist during endoscopic procedure.  Patient ID and intended procedure confirmed with present staff. Received instructions for my participation in the procedure from the performing physician.

## 2023-05-07 ENCOUNTER — Telehealth: Payer: Self-pay

## 2023-05-07 NOTE — Telephone Encounter (Signed)
  Follow up Call-     05/06/2023   10:32 AM  Call back number  Post procedure Call Back phone  # [phone]  Permission to leave phone message Yes     Patient questions:  Do you have a fever, pain , or abdominal swelling? No. Pain Score  0 *  Have you tolerated food without any problems? Yes.    Have you been able to return to your normal activities? Yes.    Do you have any questions about your discharge instructions: Diet   No. Medications  No. Follow up visit  No.  Do you have questions or concerns about your Care? No.  Actions: * If pain score is 4 or above: No action needed, pain <4.

## 2023-05-09 ENCOUNTER — Telehealth: Payer: Self-pay

## 2023-05-09 LAB — SURGICAL PATHOLOGY

## 2023-05-09 NOTE — Telephone Encounter (Signed)
 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 States this is probably not the primary site. Dr. Bridgett Camps notified.

## 2023-05-10 ENCOUNTER — Other Ambulatory Visit: Payer: Self-pay

## 2023-05-10 ENCOUNTER — Encounter: Payer: Self-pay | Admitting: *Deleted

## 2023-05-10 ENCOUNTER — Telehealth: Payer: Self-pay | Admitting: Internal Medicine

## 2023-05-10 ENCOUNTER — Other Ambulatory Visit: Payer: Self-pay | Admitting: *Deleted

## 2023-05-10 DIAGNOSIS — C210 Malignant neoplasm of anus, unspecified: Secondary | ICD-10-CM

## 2023-05-10 NOTE — Telephone Encounter (Signed)
 Inbound call from patient's daughter in law Isa Manuel, requesting to speak with Dr. Bridgett Camps regarding patient's recent colonoscopy results. Requesting a call at [phone]. Please advise, thank you.

## 2023-05-10 NOTE — Progress Notes (Signed)
 PATIENT NAVIGATOR PROGRESS NOTE  Name: Rebecca [name] Date: 05/10/2023 MRN: [id number]  DOB: [birth year]   Reason for visit:  New pt referral from Dr Albertus  Comments:  Received referral from Dr Pamula office and patient is returning from [place] .  CT Chest scheduled for 2/11 and new pt appt scheduled with Dr Cloretta on 05/16/23 at 1:40pm  Dr Pamula nurse Rock is calling patient with appts.    Time spent counseling/coordinating care: 30-45 minutes

## 2023-05-10 NOTE — Telephone Encounter (Signed)
See result note attached to pathology report.

## 2023-05-10 NOTE — Telephone Encounter (Signed)
 I spoke to the patient's daughter-in-law Updated her as to findings of colonoscopy, pathology results and upcoming plans for imaging and oncology consultation Time provided for questions and answers and she thanked me for the call

## 2023-05-14 ENCOUNTER — Ambulatory Visit (HOSPITAL_COMMUNITY)
Admission: RE | Admit: 2023-05-14 | Discharge: 2023-05-14 | Disposition: A | Discharge status: Home / Self Care | Payer: Medicare Other | Source: Ambulatory Visit | Attending: Internal Medicine | Admitting: Internal Medicine

## 2023-05-14 DIAGNOSIS — K8020 Calculus of gallbladder without cholecystitis without obstruction: Secondary | ICD-10-CM | POA: Diagnosis not present

## 2023-05-14 DIAGNOSIS — K5730 Diverticulosis of large intestine without perforation or abscess without bleeding: Secondary | ICD-10-CM | POA: Diagnosis not present

## 2023-05-14 DIAGNOSIS — C210 Malignant neoplasm of anus, unspecified: Secondary | ICD-10-CM | POA: Diagnosis not present

## 2023-05-14 DIAGNOSIS — R918 Other nonspecific abnormal finding of lung field: Secondary | ICD-10-CM | POA: Diagnosis not present

## 2023-05-14 DIAGNOSIS — I700 Atherosclerosis of aorta: Secondary | ICD-10-CM | POA: Diagnosis not present

## 2023-05-14 MED ORDER — IOHEXOL 300 MG/ML  SOLN
80.0000 mL | Freq: Once | INTRAMUSCULAR | Status: AC | PRN
  Administered 2023-05-14: 80 mL via INTRAVENOUS

## 2023-05-14 MED ORDER — IOHEXOL 300 MG/ML  SOLN
30.0000 mL | Freq: Once | INTRAMUSCULAR | Status: AC | PRN
  Administered 2023-05-14: 30 mL via ORAL

## 2023-05-15 ENCOUNTER — Encounter: Payer: Self-pay | Admitting: Sports Medicine

## 2023-05-15 ENCOUNTER — Ambulatory Visit (INDEPENDENT_AMBULATORY_CARE_PROVIDER_SITE_OTHER): Payer: Medicare Other | Admitting: Sports Medicine

## 2023-05-15 VITALS — BP 110/68 | HR 88 | Temp 97.8°F | Resp 18 | Ht 64.0 in | Wt 101.4 lb

## 2023-05-15 DIAGNOSIS — J209 Acute bronchitis, unspecified: Secondary | ICD-10-CM

## 2023-05-15 DIAGNOSIS — R6889 Other general symptoms and signs: Secondary | ICD-10-CM | POA: Diagnosis not present

## 2023-05-15 DIAGNOSIS — J45909 Unspecified asthma, uncomplicated: Secondary | ICD-10-CM

## 2023-05-15 DIAGNOSIS — J4540 Moderate persistent asthma, uncomplicated: Secondary | ICD-10-CM

## 2023-05-15 LAB — POCT INFLUENZA A/B
Influenza A, POC: NEGATIVE
Influenza B, POC: NEGATIVE

## 2023-05-15 LAB — POC COVID19 BINAXNOW: SARS Coronavirus 2 Ag: NEGATIVE

## 2023-05-15 MED ORDER — AZITHROMYCIN 250 MG PO TABS: ORAL_TABLET | ORAL | 0 refills | Status: DC

## 2023-05-15 MED ORDER — BENZONATATE 200 MG PO CAPS: 200.0000 mg | ORAL_CAPSULE | Freq: Two times a day (BID) | ORAL | 0 refills | Status: DC | PRN

## 2023-05-15 NOTE — Progress Notes (Signed)
Careteam: Patient Care Team: Ngetich, Donalee Citrin, NP as PCP - General (Family Medicine) Sharon Seller, NP (Geriatric Medicine)  PLACE OF SERVICE:  [facility]  Advanced Directive information Does Patient Have a Medical Advance Directive?: Yes, Type of Advance Directive: Healthcare Power of Annex;Living will, Does patient want to make changes to medical advance directive?: No - Patient declined  No Known Allergies  Chief Complaint  Patient presents with   Acute Visit    Cough and runny nose     Discussed the use of AI scribe software for clinical note transcription with the patient, who gave verbal consent to proceed.  History of Present Illness   Rebecca [name] is a 73 year old female with asthma who presents with a cough and respiratory symptoms. She is accompanied by her husband.  She has been experiencing a cough for about a week, which is productive of yellowish phlegm and affects her breathing. She has used her albuterol inhaler approximately four times in the past week, which is more frequent than usual. She also has a runny nose and occasional cold sores, which she often gets when she is sick. No sinus pain, ear pain, sore throat, body aches, or fever.  She has a history of asthma and uses a Symbicort inhaler regularly. She has never smoked and has no history of COPD. She mentions feeling weary over the past week, which she attributes to her asthma and recent travel from [place] for medical procedures.  She underwent a dental procedure to redo two cavities about ten days ago, which she found tiring. Additionally, she had a colonoscopy a week ago and a CT scan yesterday. No stomach pain, nausea, or significant diarrhea, noting only a couple of bowel movements yesterday without blood in the stool. No urinary issues or major headaches.   Her husband was sick with similar complaints last week, and she is concerned about her health in relation to upcoming cancer treatment  discussions. No dizziness or lightheadedness and maintains a good appetite.         Review of Systems:  Review of Systems  Constitutional:  Positive for malaise/fatigue. Negative for chills and fever.  HENT:  Positive for congestion. Negative for sore throat.   Eyes:  Negative for double vision.  Respiratory:  Positive for cough, sputum production and shortness of breath.   Cardiovascular:  Negative for chest pain, palpitations and leg swelling.  Gastrointestinal:  Negative for abdominal pain, heartburn and nausea.  Genitourinary:  Negative for dysuria, frequency and hematuria.  Musculoskeletal:  Negative for falls and myalgias.  Neurological:  Negative for dizziness, sensory change and focal weakness.   Negative unless indicated in HPI.   Past Medical History:  Diagnosis Date   Allergy    seasonal   Asthma    Osteoporosis    Other hyperlipidemia    Pneumonia    Seasonal allergies    Shingles    Past Surgical History:  Procedure Laterality Date   COLONOSCOPY  07/2017   JMP-MAC-goly(good)-tics/SSP x 1   OPEN REDUCTION INTERNAL FIXATION (ORIF) DISTAL RADIAL FRACTURE Left 01/04/2021   Procedure: OPEN REDUCTION INTERNAL FIXATION (ORIF) DISTAL RADIAL FRACTURE;  Surgeon: Roby Lofts, MD;  Location: [facility] OR;  Service: Orthopedics;  Laterality: Left;   ORIF TIBIA PLATEAU Right 03/16/2020   Procedure: OPEN REDUCTION INTERNAL FIXATION (ORIF) TIBIAL PLATEAU;  Surgeon: Roby Lofts, MD;  Location: [facility] OR;  Service: Orthopedics;  Laterality: Right;   POLYPECTOMY  2019   SSP  x 1   WISDOM TOOTH EXTRACTION     Social History:   reports that she has never smoked. She has never used smokeless tobacco. She reports current alcohol use of about 7.0 standard drinks of alcohol per week. She reports that she does not use drugs.  Family History  Problem Relation Age of Onset   Colon polyps Mother 67   Cancer - Colon Mother 6   Osteoporosis Mother    Bipolar disorder Mother    Mental  illness Mother    Colon cancer Mother 56   Other Father 30       airplane accident   Diabetes Mellitus II Paternal Grandfather    Anxiety disorder Daughter 20   Esophageal cancer Neg Hx    Stomach cancer Neg Hx    Rectal cancer Neg Hx     Medications: Patient's Medications  New Prescriptions   AZITHROMYCIN (ZITHROMAX) 250 MG TABLET    Take 2 tablets on [place] 1, then 1 tablet daily on days 2 through 5   BENZONATATE (TESSALON) 200 MG CAPSULE    Take 1 capsule (200 mg total) by mouth 2 (two) times daily as needed for cough.  Previous Medications   ALBUTEROL (VENTOLIN HFA) 108 (90 BASE) MCG/ACT INHALER    Inhale 1 puff into the lungs every 6 (six) hours as needed for wheezing or shortness of breath.   BUDESONIDE-FORMOTEROL (SYMBICORT) 80-4.5 MCG/ACT INHALER    INHALE 2 PUFFS BY MOUTH TWICE A Hubers   FEXOFENADINE (ALLEGRA) 180 MG TABLET    Take 1 tablet (180 mg total) by mouth daily.   ROSUVASTATIN (CRESTOR) 5 MG TABLET    TAKE 1 TABLET BY MOUTH ONCE DAILY  Modified Medications   No medications on file  Discontinued Medications   No medications on file    Physical Exam: Vitals:   05/15/23 0854  BP: 110/68  Pulse: 88  Resp: 18  Temp: 97.8 F (36.6 C)  SpO2: 95%  Weight: 101 lb 6.4 oz (46 kg)  Height: 5\' 4"  (1.626 m)   Body mass index is 17.41 kg/m. BP Readings from Last 3 Encounters:  05/15/23 110/68  05/06/23 108/62  01/21/23 118/78   Wt Readings from Last 3 Encounters:  05/15/23 101 lb 6.4 oz (46 kg)  05/06/23 100 lb (45.4 kg)  04/09/23 100 lb (45.4 kg)    Physical Exam Constitutional:      Appearance: Normal appearance.  HENT:     Head: Normocephalic and atraumatic.  Cardiovascular:     Rate and Rhythm: Normal rate and regular rhythm.  Pulmonary:     Effort: Pulmonary effort is normal. No respiratory distress.     Breath sounds: Normal breath sounds. No wheezing.  Abdominal:     General: Bowel sounds are normal. There is no distension.     Tenderness: There is  no abdominal tenderness. There is no guarding or rebound.     Comments:    Musculoskeletal:        General: No swelling or tenderness.  Neurological:     Mental Status: She is alert. Mental status is at baseline.     Sensory: No sensory deficit.     Motor: No weakness.     Labs reviewed: Basic Metabolic Panel: Recent Labs    01/17/23 0809  NA 138  K 4.9  CL 99  CO2 29  GLUCOSE 91  BUN 11  CREATININE 0.74  CALCIUM 9.6   Liver Function Tests: Recent Labs    01/17/23  0809  AST 17  ALT 14  BILITOT 0.5  PROT 7.0   No results for input(s): "LIPASE", "AMYLASE" in the last 8760 hours. No results for input(s): "AMMONIA" in the last 8760 hours. CBC: Recent Labs    01/17/23 0809  WBC 7.6  NEUTROABS 3,040  HGB 15.8*  HCT 49.2*  MCV 94.4  PLT 306   Lipid Panel: Recent Labs    01/17/23 0809  CHOL 175  HDL 91  LDLCALC 68  TRIG 82  CHOLHDL 1.9   TSH: No results for input(s): "TSH" in the last 8760 hours. A1C: No results found for: "HGBA1C"  Assessment and Plan 1. Flu-like symptoms (Primary) FLU/ COVID NEG  2. Moderate persistent asthma without complication No wheezing on auscultation  Cont with symbicort  3. Bronchitis with asthma, acute One week of productive cough with yellow sputum, no fever, and no significant dyspnea. History of asthma, but infrequent use of albuterol. Recent increase in albuterol use due to cough. CT scan shows bronchial thickening and mucus plugging, but no pneumonia or lymphadenopathy. -will prescribe zpak   CT showing . Diffuse bilateral bronchial wall thickening and bronchiolar plugging, consistent with nonspecific infectious or inflammatory bronchitis -Continue use of Symbicort inhaler and albuterol as needed. - azithromycin (ZITHROMAX) 250 MG tablet; Take 2 tablets on [place] 1, then 1 tablet daily on days 2 through 5  Dispense: 6 tablet; Refill: 0 - benzonatate (TESSALON) 200 MG capsule; Take 1 capsule (200 mg total) by mouth 2  (two) times daily as needed for cough.  Dispense: 20 capsule; Refill: 0           30 min Total time spent for obtaining history,  performing a medically appropriate examination and evaluation, reviewing the tests,ordering  tests,  documenting clinical information in the electronic or other health record,  ,care coordination (not separately reported)

## 2023-05-16 ENCOUNTER — Other Ambulatory Visit: Payer: Self-pay | Admitting: *Deleted

## 2023-05-16 ENCOUNTER — Inpatient Hospital Stay: Payer: Medicare Other | Attending: Oncology | Admitting: Oncology

## 2023-05-16 ENCOUNTER — Encounter: Payer: Self-pay | Admitting: *Deleted

## 2023-05-16 VITALS — BP 111/73 | HR 100 | Resp 18 | Ht 64.0 in | Wt 100.8 lb

## 2023-05-16 DIAGNOSIS — C218 Malignant neoplasm of overlapping sites of rectum, anus and anal canal: Secondary | ICD-10-CM | POA: Diagnosis not present

## 2023-05-16 DIAGNOSIS — C210 Malignant neoplasm of anus, unspecified: Secondary | ICD-10-CM | POA: Insufficient documentation

## 2023-05-16 DIAGNOSIS — C211 Malignant neoplasm of anal canal: Secondary | ICD-10-CM

## 2023-05-16 NOTE — Progress Notes (Signed)
PATIENT NAVIGATOR PROGRESS NOTE  Name: Rebecca [name] Date: 05/16/2023 MRN: [id number]  DOB: 1950/12/17   Reason for visit:  New patient appt  Comments:  Met with [name] and her spouse during visit with Dr Truett Perna  Referral made to radiation oncology Referral made to colorectal surgeon Dr Maisie Fus Referral made to SW Referral made to Nutrition Given Journeys notebook with disease specific information PET scan approved and scheduled for 2/18 at [facility] -written instructions given and reviewed with pt Added To GI conference for 2/19  F/U visit with Dr Truett Perna on 2/19 Given contact number to call with any additional questions    Time spent counseling/coordinating care: > 60 minutes

## 2023-05-16 NOTE — Progress Notes (Signed)
[facility] New Patient Consult   Requesting MD: Beverley Fiedler, Md [address] [place],  [place] [address]   Rebecca [name] 73 y.o.  [birth year]    Reason for Consult: Anorectal cancer   HPI: Rebecca [name] has a history of a colon polyp and underwent a surveillance colonoscopy 05/06/2023.  The rectal examination was normal.  Polyps were removed from the cecum, transverse colon, and descending colon.  An area of nodular mucosa was noted in the distal rectum.  The area was biopsied.  The pathology revealed sessile serrated polyps at the cecum, and a tubular adenoma and sessile serrated polyp at the transverse and descending colon.  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  The superficial colonic epithelium appeared normal with poorly differentiated malignant cells undermining the muscularis mucosa suggestive of direct extension to the rectum or a metastasis.  Immunohistochemical stains were positive for CK AE1/AE3, CK5/6, and p40.  Rebecca [name] reports feeling completely well prior to the colonoscopy.  She last underwent a colonoscopy 08/21/2017.  Polyps were removed from the hepatic flexure and ascending colon.  Polypoid mucosa at the anorectal verge was biopsied.  Pathology revealed sessile serrated polyps at the ascending and hepatic flexure.  The rectum biopsy returned as benign polypoid mucosa with a lymphoid aggregate.  No evidence of malignancy.  Rebecca [name] is here today with her significant other.  She was treated for "bronchitis "approximately 2 weeks ago.  Past Medical History:  Diagnosis Date   Allergy    seasonal   Asthma    Osteoporosis    Other hyperlipidemia    Pneumonia    Seasonal allergies    Shingles     .  G1, P1   .  Rh- blood type  Past Surgical History:  Procedure Laterality Date   COLONOSCOPY  07/2017   JMP-MAC-goly(good)-tics/SSP x 1   OPEN REDUCTION INTERNAL FIXATION (ORIF) DISTAL RADIAL FRACTURE  Left 01/04/2021   Procedure: OPEN REDUCTION INTERNAL FIXATION (ORIF) DISTAL RADIAL FRACTURE;  Surgeon: Roby Lofts, MD;  Location: [facility] OR;  Service: Orthopedics;  Laterality: Left;   ORIF TIBIA PLATEAU Right 03/16/2020   Procedure: OPEN REDUCTION INTERNAL FIXATION (ORIF) TIBIAL PLATEAU;  Surgeon: Roby Lofts, MD;  Location: [facility] OR;  Service: Orthopedics;  Laterality: Right;   POLYPECTOMY  2019   SSP x 1   WISDOM TOOTH EXTRACTION      Medications: Reviewed  Allergies: No Known Allergies  Family history: Mother died of colon cancer at age 57.  Her paternal grandmother had "stomach "cancer.  Social History:   She lives in [place].  She is a retired Optician, dispensing.  She has a home in [place].  She is not use cigarettes.  She has a glass of wine each night.  No transfusion history.  No risk factor for HIV or hepatitis.  ROS:   Positives include: "Abnormal sensation "at the rectum  A complete ROS was otherwise negative.  Physical Exam:  Blood pressure 111/73, pulse 100, resp. rate 18, height 5\' 4"  (1.626 m), weight 100 lb 12.8 oz (45.7 kg), SpO2 98%.  HEENT: Oropharynx without visible mass, neck without mass Lungs: Clear bilaterally, no respiratory distress Cardiac: Regular rate and rhythm Abdomen: No hepatosplenomegaly, no mass, nontender Rectal: No mass at the anal margin or perineum, shallow ulcer with raised borders at the right lateral rectal wall approximately 2 cm from the anal verge Vascular: No leg edema Lymph nodes: No  cervical, supraclavicular, or axillary nodes.  "Shotty "bilateral inguinal nodes Neurologic: Alert and oriented Skin: Vesicular lesions at the outer upper lip and distal nose    LAB:  CBC  Lab Results  Component Value Date   WBC 7.6 01/17/2023   HGB 15.8 (H) 01/17/2023   HCT 49.2 (H) 01/17/2023   MCV 94.4 01/17/2023   PLT 306 01/17/2023   NEUTROABS 3,040 01/17/2023        CMP  Lab Results  Component Value Date   NA 138 01/17/2023   K  4.9 01/17/2023   CL 99 01/17/2023   CO2 29 01/17/2023   GLUCOSE 91 01/17/2023   BUN 11 01/17/2023   CREATININE 0.74 01/17/2023   CALCIUM 9.6 01/17/2023   PROT 7.0 01/17/2023   ALBUMIN 3.9 03/14/2020   AST 17 01/17/2023   ALT 14 01/17/2023   ALKPHOS 70 03/14/2020   BILITOT 0.5 01/17/2023   GFRNONAA >60 03/24/2020   GFRAA 92 12/04/2018     No results found for: "CEA1", "OMV672", "CA125"  Imaging:  CT CHEST ABDOMEN PELVIS W CONTRAST Result Date: 05/15/2023 CLINICAL DATA:  Rectal mass, squamous cell anal cancer staging * Tracking Code: BO * EXAM: CT CHEST, ABDOMEN, AND PELVIS WITH CONTRAST TECHNIQUE: Multidetector CT imaging of the chest, abdomen and pelvis was performed following the standard protocol during bolus administration of intravenous contrast. RADIATION DOSE REDUCTION: This exam was performed according to the departmental dose-optimization program which includes automated exposure control, adjustment of the mA and/or kV according to patient size and/or use of iterative reconstruction technique. CONTRAST:  80mL OMNIPAQUE IOHEXOL 300 MG/ML SOLN additional oral enteric contrast COMPARISON:  03/14/2020 FINDINGS: CT CHEST FINDINGS Cardiovascular: No significant vascular findings. Normal heart size. No pericardial effusion. Mediastinum/Nodes: No enlarged mediastinal, hilar, or axillary lymph nodes. Small hiatal hernia. Thyroid gland, trachea, and esophagus demonstrate no significant findings. Lungs/Pleura: Diffuse bilateral bronchial wall thickening and bronchiolar plugging. Unchanged mild biapical pleural-parenchymal scarring. Unchanged tiny nodules measuring up to 0.3 cm in the dependent right lower lobe, benign, requiring no specific further follow-up or characterization (series 6, image 19). No pleural effusion or pneumothorax. Musculoskeletal: No chest wall abnormality. No acute osseous findings. CT ABDOMEN PELVIS FINDINGS Hepatobiliary: No solid liver abnormality is seen. Tiny  gallstone. No gallbladder wall thickening, or biliary dilatation. Pancreas: Unremarkable. No pancreatic ductal dilatation or surrounding inflammatory changes. Spleen: Normal in size without significant abnormality. Adrenals/Urinary Tract: Adrenal glands are unremarkable. Kidneys are normal, without renal calculi, solid lesion, or hydronephrosis. Bladder is unremarkable. Stomach/Bowel: Stomach is within normal limits. Appendix appears normal. No evidence of bowel wall thickening, distention, or inflammatory changes. Sigmoid diverticulosis. Vascular/Lymphatic: Aortic atherosclerosis. No enlarged abdominal or pelvic lymph nodes. Reproductive: No mass or other abnormality. Other: No abdominal wall hernia or abnormality. No ascites. Musculoskeletal: No acute osseous findings. IMPRESSION: 1. No directly visualized rectal or anal mass. No evidence of lymphadenopathy or metastatic disease in the chest, abdomen, or pelvis. 2. Diffuse bilateral bronchial wall thickening and bronchiolar plugging, consistent with nonspecific infectious or inflammatory bronchitis. 3. Cholelithiasis. 4. Sigmoid diverticulosis without evidence of acute diverticulitis. Aortic Atherosclerosis (ICD10-I70.0). Electronically Signed   By: Jearld Lesch M.D.   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   Osteoporosis 4.   Asthma 5.  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  The pathology did not reveal a mucosal primary suggest direct extension or metastatic disease to the rectal wall.  There is no clinical or radiologic evidence of distant disease.  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  She will be referred to a coloanal surgeon and radiation oncology.  She will be referred for a staging PET scan.  Her case will be presented at the GI tumor conference on 05/22/2023.  We discussed standard treatment of anal cancer with chemotherapy/radiation.  Rebecca [name] is unsure whether she will complete treatment for the anal cancer in [place] or [place].  She will return for an office visit on 05/22/2023.  Thornton Papas, MD  05/16/2023, 3:57 PM

## 2023-05-16 NOTE — Progress Notes (Signed)
See Navigation note

## 2023-05-17 ENCOUNTER — Telehealth: Payer: Self-pay | Admitting: Internal Medicine

## 2023-05-17 NOTE — Telephone Encounter (Signed)
Please let patient know that I have full confidence in Dr. Truett Perna.  She is in excellent hands. Hopefully I did not lead her astray based on my initial CT results but I am in agreement with the plan that Dr. Truett Perna laid out Certainly this is a scary and confusing time for her with this new diagnosis but more information will be gathered as treatment plans are made

## 2023-05-17 NOTE — Telephone Encounter (Signed)
Inbound call from patient stating she had a recent diagnoses and is requesting a call to discuss. Please advise.

## 2023-05-17 NOTE — Telephone Encounter (Signed)
Pt called and states she was "thrown off" by her visit with Dr. Truett Perna yesterday. He told her she would need 5 weeks of radiation and chemo at the beginning and the end. He told her this was standard protocol. He did not seem confident in the CT scan and has ordered at PET scan, he is not sure that the cancer is not anywhere else. Pt was pleased when Dr. Rhea Belton gave her the CT results but everything Dr. Truett Perna told her has been hard to take in. She sees Dr. Maisie Fus on Monday, PET scan on Tues, and Cancer conf on WED and sees Dr. Truett Perna again on WED. Dr. Truett Perna told her the Squamous cell cancer is below the dentate line but when he examined her he felt the area above the dentate line. She wanted to know if Dr. Rhea Belton is confident in Dr. Truett Perna. Let her know these concerns would be sent to Dr. Rhea Belton.

## 2023-05-17 NOTE — Telephone Encounter (Signed)
Spoke with pt and she is aware of Dr. Lauro Franklin comments/recommendations.

## 2023-05-20 ENCOUNTER — Ambulatory Visit: Payer: Self-pay | Admitting: General Surgery

## 2023-05-20 ENCOUNTER — Encounter: Payer: Self-pay | Admitting: General Practice

## 2023-05-20 DIAGNOSIS — C210 Malignant neoplasm of anus, unspecified: Secondary | ICD-10-CM | POA: Diagnosis not present

## 2023-05-20 NOTE — H&P (Signed)
REFERRING PHYSICIAN:  Lucile Shutters,*  PROVIDER:  Elenora Gamma, MD  MRN: [id number] DOB: 1950-04-24 DATE OF ENCOUNTER: 05/20/2023  Subjective   Chief Complaint: No chief complaint on file.     History of Present Illness: Rebecca [name] is a 73 y.o. female who is seen today as an office consultation at the request of Dr. Truett Perna for evaluation of No chief complaint on file. .  Pt underwent colonoscopy on 05/06/23 for surveillance of colon polyps and was noted to have an area of nodular mucosa at the distal rectum.  Biopsy showed moderately to poorly differentiated SCC.  CT CAP neg for any metastatic disease.  CT PET scheduled for 2/18.     Review of Systems: A complete review of systems was obtained from the patient.  I have reviewed this information and discussed as appropriate with the patient.  See HPI as well for other ROS.   Medical History: Past Medical History:  Diagnosis Date   Asthma, unspecified asthma severity, unspecified whether complicated, unspecified whether persistent (HHS-HCC)    History of cancer     There is no problem list on file for this patient.   History reviewed. No pertinent surgical history.   No Known Allergies  Current Outpatient Medications on File Prior to Visit  Medication Sig Dispense Refill   albuterol MDI, PROVENTIL, VENTOLIN, PROAIR, HFA 90 mcg/actuation inhaler Inhale 1 Puff into the lungs     budesonide-formoteroL (SYMBICORT) 80-4.5 mcg/actuation inhaler Inhale 2 inhalations into the lungs 2 (two) times daily     rosuvastatin (CRESTOR) 5 MG tablet Take 1 tablet by mouth once daily     No current facility-administered medications on file prior to visit.    Family History  Problem Relation Age of Onset   Stroke Mother    Hyperlipidemia (Elevated cholesterol) Mother    Colon cancer Mother      Social History   Tobacco Use  Smoking Status Never  Smokeless Tobacco Never     Social History   Socioeconomic  History   Marital status: Unknown  Tobacco Use   Smoking status: Never   Smokeless tobacco: Never  Substance and Sexual Activity   Alcohol use: Yes    Alcohol/week: 7.0 standard drinks of alcohol    Types: 7 Glasses of wine per week   Drug use: Never   Social Drivers of Health   Food Insecurity: No Food Insecurity (05/16/2023)   Received from [facility]   Hunger Vital Sign    Worried About Running Out of Food in the Last Year: Never true    Ran Out of Food in the Last Year: Never true  Transportation Needs: No Transportation Needs (05/16/2023)   Received from [facility] - Transportation    Lack of Transportation (Medical): No    Lack of Transportation (Non-Medical): No  Housing Stability: Unknown (05/20/2023)   Housing Stability Vital Sign    Homeless in the Last Year: No    Objective:    Vitals:   05/20/23 1427  BP: 122/70  Pulse: (!) 112  Temp: 36.4 C (97.6 F)  SpO2: 98%  Weight: 46.4 kg (102 lb 3.2 oz)  Height: 162.6 cm (5\' 4" )  PainSc: 0-No pain     Exam Gen: NAD Abd: soft Rectal: Anterior distal rectal mass, mobile   Labs, Imaging and Diagnostic Testing: CT scan and colonoscopy report reviewed.  Assessment and Plan:  Diagnoses and all orders for this visit:  Anal squamous cell  carcinoma (CMS/HHS-HCC)    73 y.o. F with SCC of the distal rectum.  This appears surgically resectable.  I have recommended trans-anal excision.  Risks such as bleeding, pain, recurrence and need for additional treatment discussed with patient and her husband.  All questions answered.  Will hold off on surgery if PET is concerning for metastatic disease.    Vanita Panda, MD Colon and Rectal Surgery [facility]

## 2023-05-20 NOTE — Progress Notes (Signed)
[facility] Spiritual Care Note  Returned Jezreel's call, scheduling Spiritual Care appointment for her and her husband this afternoon in [facility] Spiritual Care office following her pre-op appointment at [facility]; 3:30 pm is an approximate target meeting time.  Britiany is welcome to come to Spiritual Care office as soon as she is available--chaplain has held schedule accordingly.   [address] [place], [place], [facility] Pager [phone] Voicemail [phone]

## 2023-05-20 NOTE — H&P (View-Only) (Signed)
 REFERRING PHYSICIAN:  Lucile Shutters,*  PROVIDER:  Elenora Gamma, MD  MRN: [id number] DOB: 1950-04-24 DATE OF ENCOUNTER: 05/20/2023  Subjective   Chief Complaint: No chief complaint on file.     History of Present Illness: Rebecca [name] is a 73 y.o. female who is seen today as an office consultation at the request of Dr. Truett Perna for evaluation of No chief complaint on file. .  Pt underwent colonoscopy on 05/06/23 for surveillance of colon polyps and was noted to have an area of nodular mucosa at the distal rectum.  Biopsy showed moderately to poorly differentiated SCC.  CT CAP neg for any metastatic disease.  CT PET scheduled for 2/18.     Review of Systems: A complete review of systems was obtained from the patient.  I have reviewed this information and discussed as appropriate with the patient.  See HPI as well for other ROS.   Medical History: Past Medical History:  Diagnosis Date   Asthma, unspecified asthma severity, unspecified whether complicated, unspecified whether persistent (HHS-HCC)    History of cancer     There is no problem list on file for this patient.   History reviewed. No pertinent surgical history.   No Known Allergies  Current Outpatient Medications on File Prior to Visit  Medication Sig Dispense Refill   albuterol MDI, PROVENTIL, VENTOLIN, PROAIR, HFA 90 mcg/actuation inhaler Inhale 1 Puff into the lungs     budesonide-formoteroL (SYMBICORT) 80-4.5 mcg/actuation inhaler Inhale 2 inhalations into the lungs 2 (two) times daily     rosuvastatin (CRESTOR) 5 MG tablet Take 1 tablet by mouth once daily     No current facility-administered medications on file prior to visit.    Family History  Problem Relation Age of Onset   Stroke Mother    Hyperlipidemia (Elevated cholesterol) Mother    Colon cancer Mother      Social History   Tobacco Use  Smoking Status Never  Smokeless Tobacco Never     Social History   Socioeconomic  History   Marital status: Unknown  Tobacco Use   Smoking status: Never   Smokeless tobacco: Never  Substance and Sexual Activity   Alcohol use: Yes    Alcohol/week: 7.0 standard drinks of alcohol    Types: 7 Glasses of wine per week   Drug use: Never   Social Drivers of Health   Food Insecurity: No Food Insecurity (05/16/2023)   Received from [facility]   Hunger Vital Sign    Worried About Running Out of Food in the Last Year: Never true    Ran Out of Food in the Last Year: Never true  Transportation Needs: No Transportation Needs (05/16/2023)   Received from [facility] - Transportation    Lack of Transportation (Medical): No    Lack of Transportation (Non-Medical): No  Housing Stability: Unknown (05/20/2023)   Housing Stability Vital Sign    Homeless in the Last Year: No    Objective:    Vitals:   05/20/23 1427  BP: 122/70  Pulse: (!) 112  Temp: 36.4 C (97.6 F)  SpO2: 98%  Weight: 46.4 kg (102 lb 3.2 oz)  Height: 162.6 cm (5\' 4" )  PainSc: 0-No pain     Exam Gen: NAD Abd: soft Rectal: Anterior distal rectal mass, mobile   Labs, Imaging and Diagnostic Testing: CT scan and colonoscopy report reviewed.  Assessment and Plan:  Diagnoses and all orders for this visit:  Anal squamous cell  carcinoma (CMS/HHS-HCC)    73 y.o. F with SCC of the distal rectum.  This appears surgically resectable.  I have recommended trans-anal excision.  Risks such as bleeding, pain, recurrence and need for additional treatment discussed with patient and her husband.  All questions answered.  Will hold off on surgery if PET is concerning for metastatic disease.    Vanita Panda, MD Colon and Rectal Surgery [facility]

## 2023-05-20 NOTE — Progress Notes (Signed)
[facility] Spiritual Care Note  Met with Rebecca [name] and her husband Rebecca [name] in Spiritual Care office at [facility] as planned. They shared and processed their story of diagnosis and evolving treatment plan, and how that experience fits in with other life priorities, stressors, and demands.  They live part of the year on [facility], so they are adjusting significantly as treatment plans become clear. Additionally, Rebecca [name] has a very special spiritual trip to [place], [place] planned for early April, which she hopes to be able to take as scheduled.  Community and belonging are two themes that emerged as integrally important to them. We also discussed the strengths of transferable skills, gifts, and insights--including creative outlets, adaptability, spiritual resources, interpersonal relationships, and others--that they have developed through past challenges.  Rebecca [name] and Rebecca [name] have direct Spiritual Care number and plan to reach out to schedule a follow-up session when it will be helpful for them to share again.   [address] [place], [place], [facility] Pager [phone] Voicemail [phone]

## 2023-05-21 ENCOUNTER — Encounter (HOSPITAL_COMMUNITY)
Admission: RE | Admit: 2023-05-21 | Discharge: 2023-05-21 | Disposition: A | Discharge status: Home / Self Care | Payer: Medicare Other | Source: Ambulatory Visit | Attending: Oncology | Admitting: Oncology

## 2023-05-21 DIAGNOSIS — C218 Malignant neoplasm of overlapping sites of rectum, anus and anal canal: Secondary | ICD-10-CM | POA: Diagnosis not present

## 2023-05-21 DIAGNOSIS — C211 Malignant neoplasm of anal canal: Secondary | ICD-10-CM | POA: Diagnosis not present

## 2023-05-21 LAB — GLUCOSE, CAPILLARY: Glucose-Capillary: 95 mg/dL (ref 70–99)

## 2023-05-21 MED ORDER — FLUDEOXYGLUCOSE F - 18 (FDG) INJECTION
5.0000 | Freq: Once | INTRAVENOUS | Status: AC
  Administered 2023-05-21: 5 via INTRAVENOUS

## 2023-05-22 ENCOUNTER — Encounter (HOSPITAL_COMMUNITY): Payer: Self-pay

## 2023-05-22 ENCOUNTER — Other Ambulatory Visit: Payer: Self-pay | Admitting: *Deleted

## 2023-05-22 ENCOUNTER — Inpatient Hospital Stay: Payer: Medicare Other | Admitting: Oncology

## 2023-05-22 VITALS — BP 117/67 | HR 97 | Temp 97.9°F | Resp 18 | Ht 64.0 in | Wt 102.5 lb

## 2023-05-22 DIAGNOSIS — C211 Malignant neoplasm of anal canal: Secondary | ICD-10-CM | POA: Diagnosis not present

## 2023-05-22 DIAGNOSIS — C218 Malignant neoplasm of overlapping sites of rectum, anus and anal canal: Secondary | ICD-10-CM | POA: Diagnosis not present

## 2023-05-22 NOTE — Progress Notes (Signed)
[facility] OFFICE PROGRESS NOTE   Diagnosis: Anal cancer  INTERVAL HISTORY:   Rebecca [name] returns as scheduled.  She feels well.  No complaint.  She saw Dr. Maisie Fus 05/20/2023.  Dr. Maisie Fus noted a mobile anterior rectal mass.  She recommends surgical resection.  Rebecca [name] would like to proceed with surgery.  Objective:  Vital signs in last 24 hours:  Blood pressure 117/67, pulse 97, temperature 97.9 F (36.6 C), temperature source Temporal, resp. rate 18, height 5\' 4"  (1.626 m), weight 102 lb 8 oz (46.5 kg), SpO2 97%.   Physical examination not performed today  Lab Results:  Lab Results  Component Value Date   WBC 7.6 01/17/2023   HGB 15.8 (H) 01/17/2023   HCT 49.2 (H) 01/17/2023   MCV 94.4 01/17/2023   PLT 306 01/17/2023   NEUTROABS 3,040 01/17/2023    CMP  Lab Results  Component Value Date   NA 138 01/17/2023   K 4.9 01/17/2023   CL 99 01/17/2023   CO2 29 01/17/2023   GLUCOSE 91 01/17/2023   BUN 11 01/17/2023   CREATININE 0.74 01/17/2023   CALCIUM 9.6 01/17/2023   PROT 7.0 01/17/2023   ALBUMIN 3.9 03/14/2020   AST 17 01/17/2023   ALT 14 01/17/2023   ALKPHOS 70 03/14/2020   BILITOT 0.5 01/17/2023   GFRNONAA >60 03/24/2020   GFRAA 92 12/04/2018    Imaging:  NM PET Image Initial (PI) Skull Base To Thigh Result Date: 05/21/2023 CLINICAL DATA:  Initial treatment strategy for anal cancer. EXAM: NUCLEAR MEDICINE PET SKULL BASE TO THIGH TECHNIQUE: 4.98 mCi F-18 FDG was injected intravenously. Full-ring PET imaging was performed from the skull base to thigh after the radiotracer. CT data was obtained and used for attenuation correction and anatomic localization. Fasting blood glucose: 95 mg/dl COMPARISON:  CT scan 04/54/0981 FINDINGS: Mediastinal blood pool activity: SUV max 1.96 Liver activity: SUV max NA NECK: No hypermetabolic lymph nodes in the neck. Incidental CT findings: None. CHEST: No hypermetabolic mediastinal or hilar nodes. No suspicious pulmonary  nodules on the CT scan. No hypermetabolic breast masses, supraclavicular or axillary adenopathy. Incidental CT findings: None ABDOMEN/PELVIS: No abnormal hypermetabolic activity within the liver, pancreas, adrenal glands, or spleen. No hypermetabolic lymph nodes in the abdomen or pelvis. Moderate hypermetabolism at the anorectal junction with SUV max of 5.08 likely representing the patient's known neoplasm. No hypermetabolic lesions in the ischial rectal fossa. No inguinal adenopathy. Incidental CT findings: Scattered vascular calcifications but no aneurysm. Sigmoid colon diverticulosis. SKELETON: No focal hypermetabolic activity to suggest skeletal metastasis. Incidental CT findings: None. IMPRESSION: 1. Moderate hypermetabolism at the anorectal junction likely representing the patient's known neoplasm. 2. No findings for metastatic disease involving the neck, chest, abdomen/pelvis or bony structures. Electronically Signed   By: Rudie Meyer M.D.   On: 05/21/2023 21:52    Medications: I have reviewed the patient's current medications.   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, normal superficial epithelium with malignant cells undermining the muscularis mucosa consistent with direct extension or metastasis CTs 05/14/2023-no rectal or anal mass, no lymphadenopathy or evidence of metastatic disease PET 05/21/2023-moderate hypermetabolism at the anorectal junction, no evidence of metastatic disease Colon polyps on colonoscopy 05/06/2023: Sessile serrated polyps and tubular adenoma Colonoscopy 08/21/2017: Sessile serrated polyps at the ascending and hepatic flexure, biopsy of polypoid mucosa at the distal rectum near the dentate line-benign polypoid mucosa with a lymphoid aggregate 3.  Osteoporosis 4.   Asthma 5.  Vesicular lip/external nose lesions on exam  05/16/2023    Disposition: Rebecca [name] has been diagnosed with squamous cell carcinoma of the distal rectum.  A staging PET scan reveals no evidence of metastatic disease.  I reviewed the PET findings and images with Rebecca [name] and her husband.  We discussed treatment options.  She saw Dr. Maisie Fus on 05/20/2023.  Dr. Maisie Fus noted the lesion is mobile and potentially amenable to transanal resection.  Her case was presented at the GI tumor conference this morning.  A staging pelvic MRI is recommended.  Rebecca [name] would like to proceed with surgery if the MRI does not reveal more advanced disease.  She understands it is possible the tumor will be unresectable via a transanal approach.  She also understands chemotherapy/radiation may recommended based on the surgical pathology findings.  She plans to continue clinical follow-up with Dr. Maisie Fus.  She will return to the medical oncology clinic as needed.  Thornton Papas, MD  05/22/2023  2:30 PM

## 2023-05-22 NOTE — Patient Instructions (Addendum)
 SURGICAL WAITING ROOM VISITATION Patients having surgery or a procedure may have no more than 2 support people in the waiting area - these visitors may rotate.    Children under the age of 26 must have an adult with them who is not the patient.  Due to an increase in RSV and influenza rates and associated hospitalizations, children ages 19 and under may not visit patients in [facility] hospitals.   If the patient needs to stay at the hospital during part of their recovery, the visitor guidelines for inpatient rooms apply. Pre-op nurse will coordinate an appropriate time for 1 support person to accompany patient in pre-op.  This support person may not rotate.    Please refer to the [facility] website for the visitor guidelines for Inpatients (after your surgery is over and you are in a regular room).       Your procedure is scheduled on: 06-05-23   Report to [facility] Main Entrance    Report to admitting at 6:15 AM   Call this number if you have problems the morning of surgery [phone]   Do not eat food or drink liquds: :After Midnight.          If you have questions, please contact your surgeon's office.   FOLLOW BOWEL PREP AND ANY ADDITIONAL PRE OP INSTRUCTIONS YOU RECEIVED FROM YOUR SURGEON'S OFFICE!!!     Oral Hygiene is also important to reduce your risk of infection.                                    Remember - BRUSH YOUR TEETH THE MORNING OF SURGERY WITH YOUR REGULAR TOOTHPASTE   Do NOT smoke after Midnight   Take these medicines the morning of surgery with A SIP OF WATER:    Okay to use inhalers  Stop all vitamins and herbal supplements 7 days before surgery                              You may not have any metal on your body including hair pins, jewelry, and body piercing             Do not wear make-up, lotions, powders, perfumes, or deodorant  Do not wear nail polish including gel and S&S, artificial/acrylic nails, or any other type of covering on  natural nails including finger and toenails. If you have artificial nails, gel coating, etc. that needs to be removed by a nail salon please have this removed prior to surgery or surgery may need to be canceled/ delayed if the surgeon/ anesthesia feels like they are unable to be safely monitored.   Do not shave  48 hours prior to surgery.        Do not bring valuables to the hospital. [facility] IS NOT RESPONSIBLE   FOR VALUABLES.   Contacts, dentures or bridgework may not be worn into surgery.  DO NOT BRING YOUR HOME MEDICATIONS TO THE HOSPITAL. PHARMACY WILL DISPENSE MEDICATIONS LISTED ON YOUR MEDICATION LIST TO YOU DURING YOUR ADMISSION IN THE HOSPITAL!    Patients discharged on the [place] of surgery will not be allowed to drive home.  Someone NEEDS to stay with you for the first 24 hours after anesthesia.   Special Instructions: Bring a copy of your healthcare power of attorney and living will documents the [place] of surgery  if you haven't scanned them before.              Please read over the following fact sheets you were given: IF YOU HAVE QUESTIONS ABOUT YOUR PRE-OP INSTRUCTIONS PLEASE CALL [phone]  If you received a COVID test during your pre-op visit  it is requested that you wear a mask when out in public, stay away from anyone that may not be feeling well and notify your surgeon if you develop symptoms. If you test positive for Covid or have been in contact with anyone that has tested positive in the last 10 days please notify you surgeon.  [facility] - Preparing for Surgery Before surgery, you can play an important role.  Because skin is not sterile, your skin needs to be as free of germs as possible.  You can reduce the number of germs on your skin by washing with CHG (chlorahexidine gluconate) soap before surgery.  CHG is an antiseptic cleaner which kills germs and bonds with the skin to continue killing germs even after washing. Please DO NOT use if you have an allergy to CHG or  antibacterial soaps.  If your skin becomes reddened/irritated stop using the CHG and inform your nurse when you arrive at Short Stay. Do not shave (including legs and underarms) for at least 48 hours prior to the first CHG shower.  You may shave your face/neck.  Please follow these instructions carefully:  1.  Shower with CHG Soap the night before surgery and the  morning of surgery.  2.  If you choose to wash your hair, wash your hair first as usual with your normal  shampoo.  3.  After you shampoo, rinse your hair and body thoroughly to remove the shampoo.                             4.  Use CHG as you would any other liquid soap.  You can apply chg directly to the skin and wash.  Gently with a scrungie or clean washcloth.  5.  Apply the CHG Soap to your body ONLY FROM THE NECK DOWN.   Do   not use on face/ open                           Wound or open sores. Avoid contact with eyes, ears mouth and   genitals (private parts).                       Wash face,  Genitals (private parts) with your normal soap.             6.  Wash thoroughly, paying special attention to the area where your    surgery  will be performed.  7.  Thoroughly rinse your body with warm water from the neck down.  8.  DO NOT shower/wash with your normal soap after using and rinsing off the CHG Soap.                9.  Pat yourself dry with a clean towel.            10.  Wear clean pajamas.            11.  Place clean sheets on your bed the night of your first shower and do not  sleep with pets. [facility] : Do not apply  any lotions/deodorants the morning of surgery.  Please wear clean clothes to the hospital/surgery center.  FAILURE TO FOLLOW THESE INSTRUCTIONS MAY RESULT IN THE CANCELLATION OF YOUR SURGERY  PATIENT SIGNATURE_________________________________  NURSE SIGNATURE__________________________________  ________________________________________________________________________

## 2023-05-22 NOTE — Progress Notes (Unsigned)
 MR pelvis ordered

## 2023-05-23 ENCOUNTER — Other Ambulatory Visit: Payer: Self-pay | Admitting: *Deleted

## 2023-05-23 NOTE — Progress Notes (Signed)
 The proposed treatment discussed in conference is for discussion purpose only and is not a binding recommendation.  The patients have not been physically examined, or presented with their treatment options.  Therefore, final treatment plans cannot be decided.

## 2023-05-27 ENCOUNTER — Other Ambulatory Visit: Payer: Self-pay | Admitting: *Deleted

## 2023-05-27 ENCOUNTER — Encounter (HOSPITAL_COMMUNITY)
Admission: RE | Admit: 2023-05-27 | Discharge: 2023-05-27 | Disposition: A | Discharge status: Home / Self Care | Payer: Medicare Other | Source: Ambulatory Visit | Attending: General Surgery | Admitting: General Surgery

## 2023-05-27 ENCOUNTER — Ambulatory Visit (HOSPITAL_COMMUNITY)
Admission: RE | Admit: 2023-05-27 | Discharge: 2023-05-27 | Disposition: A | Discharge status: Home / Self Care | Payer: Medicare Other | Source: Ambulatory Visit | Attending: Oncology | Admitting: Oncology

## 2023-05-27 ENCOUNTER — Encounter (HOSPITAL_COMMUNITY): Payer: Self-pay

## 2023-05-27 ENCOUNTER — Other Ambulatory Visit: Payer: Self-pay

## 2023-05-27 VITALS — BP 129/70 | HR 91 | Temp 98.1°F | Resp 16 | Ht 64.0 in | Wt 100.4 lb

## 2023-05-27 DIAGNOSIS — K5730 Diverticulosis of large intestine without perforation or abscess without bleeding: Secondary | ICD-10-CM | POA: Diagnosis not present

## 2023-05-27 DIAGNOSIS — C211 Malignant neoplasm of anal canal: Secondary | ICD-10-CM

## 2023-05-27 DIAGNOSIS — M799 Soft tissue disorder, unspecified: Secondary | ICD-10-CM | POA: Diagnosis not present

## 2023-05-27 DIAGNOSIS — C210 Malignant neoplasm of anus, unspecified: Secondary | ICD-10-CM | POA: Diagnosis not present

## 2023-05-27 DIAGNOSIS — D649 Anemia, unspecified: Secondary | ICD-10-CM | POA: Diagnosis not present

## 2023-05-27 HISTORY — DX: Malignant neoplasm of anal canal: C21.1

## 2023-05-27 HISTORY — DX: Anemia, unspecified: D64.9

## 2023-05-27 LAB — CBC
HCT: 47.9 % — ABNORMAL HIGH (ref 36.0–46.0)
Hemoglobin: 15.7 g/dL — ABNORMAL HIGH (ref 12.0–15.0)
MCH: 30.5 pg (ref 26.0–34.0)
MCHC: 32.8 g/dL (ref 30.0–36.0)
MCV: 93.2 fL (ref 80.0–100.0)
Platelets: 365 10*3/uL (ref 150–400)
RBC: 5.14 MIL/uL — ABNORMAL HIGH (ref 3.87–5.11)
RDW: 12.2 % (ref 11.5–15.5)
WBC: 8 10*3/uL (ref 4.0–10.5)
nRBC: 0 % (ref 0.0–0.2)

## 2023-05-27 NOTE — Progress Notes (Signed)
 COVID Vaccine Completed:  Yes  Date of COVID positive in last 90 days:  No  PCP - Ernestene Mention, NP Cardiologist - N/A  Chest x-ray -  N/A EKG -  N/A Stress Test -  N/A ECHO -  N/A Cardiac Cath -  N/A Pacemaker/ICD device last checked: Spinal Cord Stimulator: N/A  Bowel Prep -  N/A  Sleep Study -  N/A CPAP -   Fasting Blood Sugar -  N/A Checks Blood Sugar _____ times a Farro  Last dose of GLP1 agonist-  N/A GLP1 instructions:  Hold 7 days before surgery    Last dose of SGLT-2 inhibitors-  N/A SGLT-2 instructions:  Hold 3 days before surgery   Blood Thinner Instructions:   N/A Aspirin Instructions: Last Dose:  Activity level:  Can go up a flight of stairs and perform activities of daily living without stopping and without symptoms of chest pain or shortness of breath.  Anesthesia review:  N/A  Patient denies shortness of breath, fever, cough and chest pain at PAT appointment  Patient verbalized understanding of instructions that were given to them at the PAT appointment. Patient was also instructed that they will need to review over the PAT instructions again at home before surgery.

## 2023-05-28 ENCOUNTER — Telehealth: Payer: Self-pay | Admitting: *Deleted

## 2023-05-28 ENCOUNTER — Ambulatory Visit: Payer: Medicare Other | Admitting: Radiation Oncology

## 2023-05-28 ENCOUNTER — Telehealth: Payer: Self-pay

## 2023-05-28 ENCOUNTER — Ambulatory Visit: Payer: Medicare Other

## 2023-05-28 NOTE — Telephone Encounter (Signed)
 Rebecca [name] called asking for nurse navigator to inquire if her MRI results showed that she can have her surgery. Scan has not been read yet. Called reading room and requested a read for tomorrow.

## 2023-05-28 NOTE — Telephone Encounter (Signed)
 [facility] CSW Progress Note  Clinical Child psychotherapist received vm from patient confirming she is connected with [facility] Spiritual Care and feels support is adequate. CSW confirmed with spiritual care that continued support is planned. No follow up needed at this time. Referral will be closed.     Marguerita Merles, LCSW Clinical Social Worker [facility]

## 2023-05-28 NOTE — Telephone Encounter (Signed)
 Called and informed patient that the MRi results are not available yet will continue to monitor for results

## 2023-05-28 NOTE — Telephone Encounter (Signed)
 [facility] Clinical Social Work  Clinical Social Work was referred by new patient protocol for assessment of psychosocial needs.  Clinical Social Worker attempted to contact patient by phone to offer support and assess for needs.  CSW was unable to reach patient, left vm with direct contact.  CSW will attempt to reach again. Referral will deferred for now.    Marguerita Merles, LCSW  Clinical Social Worker [facility]

## 2023-05-29 ENCOUNTER — Encounter: Payer: Self-pay | Admitting: *Deleted

## 2023-05-29 NOTE — Progress Notes (Signed)
 PATIENT NAVIGATOR PROGRESS NOTE  Name: Rebecca [name] Date: 05/29/2023 MRN: [id number]  DOB: 05/19/1950   Reason for visit:  F/U after MRi pelvis  Comments:  Called and discussed MRI pelvis results and Dr Truett Perna and Dr Maisie Fus with plan to proceed with surgery, no metastatic disease and is resectable  Surgery is planned for 06/05/23  Pt appreciated call    Time spent counseling/coordinating care: 30-45 minutes

## 2023-06-03 ENCOUNTER — Encounter: Payer: Self-pay | Admitting: General Practice

## 2023-06-03 NOTE — Progress Notes (Signed)
 [facility] Spiritual Care Note  Had visit with Rebecca [name] and her husband Rebecca [name] in Spiritual Care office as planned to make space for them to continue their spiritual reflection in preparation for her upcoming surgery. We particularly discussed the theme of "passages," both literal and metaphorical, as they seek to answer the query, "How am I changing through this?"   Provided compassionate presence, empathic listening, pastoral reflection, and emotional support as they processed aloud and reflected on their learning experiences through diagnosis, prep for surgery, spending more time than planned in [place] instead of in their [place] on [facility], anticipating time with family (visiting for post-op support), and navigating changes and vulnerability that come with aging.  Couple plans to follow up with Spiritual Care as needed after surgical recovery.   [address] Rush Barer, [place], [facility] Pager [phone] Voicemail [phone]

## 2023-06-04 NOTE — Anesthesia Preprocedure Evaluation (Signed)
 Anesthesia Evaluation  Patient identified by MRN, date of birth, ID band Patient awake    Reviewed: Allergy & Precautions, NPO status , Patient's Chart, lab work & pertinent test results  History of Anesthesia Complications Negative for: history of anesthetic complications  Airway Mallampati: II  TM Distance: >3 FB Neck ROM: Full    Dental  (+) Dental Advisory Given   Pulmonary neg shortness of breath, asthma (no recent flares, last inhaler use ~1 month ago) , neg sleep apnea, neg COPD, neg recent URI   Pulmonary exam normal breath sounds clear to auscultation       Cardiovascular (-) hypertension(-) angina (-) Past MI, (-) Cardiac Stents and (-) CABG (-) dysrhythmias  Rhythm:Regular Rate:Normal  HLD   Neuro/Psych negative neurological ROS     GI/Hepatic Neg liver ROS,neg GERD  ,,Anal cancer   Endo/Other  negative endocrine ROS    Renal/GU negative Renal ROS     Musculoskeletal Osteoporosis    Abdominal   Peds  Hematology negative hematology ROS (+) Lab Results      Component                Value               Date                      WBC                      8.0                 05/27/2023                HGB                      15.7 (H)            05/27/2023                HCT                      47.9 (H)            05/27/2023                MCV                      93.2                05/27/2023                PLT                      365                 05/27/2023              Anesthesia Other Findings   Reproductive/Obstetrics                             Anesthesia Physical Anesthesia Plan  ASA: 2  Anesthesia Plan: General   Post-op Pain Management:    Induction:   PONV Risk Score and Plan: 3 and Ondansetron, Dexamethasone and Treatment may vary due to age or medical condition  Airway Management Planned: Oral ETT  Additional Equipment:   Intra-op Plan:    Post-operative Plan: Extubation in OR  Informed Consent:  I have reviewed the patients History and Physical, chart, labs and discussed the procedure including the risks, benefits and alternatives for the proposed anesthesia with the patient or authorized representative who has indicated his/her understanding and acceptance.     Dental advisory given  Plan Discussed with: CRNA and Anesthesiologist  Anesthesia Plan Comments: (Risks of general anesthesia discussed including, but not limited to, sore throat, hoarse voice, chipped/damaged teeth, injury to vocal cords, nausea and vomiting, allergic reactions, lung infection, heart attack, stroke, and death. All questions answered. )        Anesthesia Quick Evaluation

## 2023-06-05 ENCOUNTER — Ambulatory Visit (HOSPITAL_COMMUNITY): Admitting: Certified Registered Nurse Anesthetist

## 2023-06-05 ENCOUNTER — Ambulatory Visit (HOSPITAL_BASED_OUTPATIENT_CLINIC_OR_DEPARTMENT_OTHER): Admitting: Certified Registered Nurse Anesthetist

## 2023-06-05 ENCOUNTER — Encounter (HOSPITAL_COMMUNITY): Payer: Self-pay | Admitting: General Surgery

## 2023-06-05 ENCOUNTER — Ambulatory Visit (HOSPITAL_COMMUNITY)
Admission: RE | Admit: 2023-06-05 | Discharge: 2023-06-05 | Disposition: A | Discharge status: Home / Self Care | Payer: Medicare Other | Attending: General Surgery | Admitting: General Surgery

## 2023-06-05 ENCOUNTER — Encounter (HOSPITAL_COMMUNITY)
Admission: RE | Disposition: A | Discharge status: Home / Self Care | Payer: Self-pay | Source: Home / Self Care | Attending: General Surgery

## 2023-06-05 ENCOUNTER — Other Ambulatory Visit: Payer: Self-pay

## 2023-06-05 DIAGNOSIS — C210 Malignant neoplasm of anus, unspecified: Secondary | ICD-10-CM | POA: Diagnosis not present

## 2023-06-05 DIAGNOSIS — C44520 Squamous cell carcinoma of anal skin: Secondary | ICD-10-CM | POA: Diagnosis not present

## 2023-06-05 DIAGNOSIS — E785 Hyperlipidemia, unspecified: Secondary | ICD-10-CM | POA: Insufficient documentation

## 2023-06-05 DIAGNOSIS — J45909 Unspecified asthma, uncomplicated: Secondary | ICD-10-CM | POA: Insufficient documentation

## 2023-06-05 DIAGNOSIS — C218 Malignant neoplasm of overlapping sites of rectum, anus and anal canal: Secondary | ICD-10-CM | POA: Diagnosis not present

## 2023-06-05 HISTORY — PX: TRANSANAL EXCISION OF RECTAL MASS: SHX6134

## 2023-06-05 SURGERY — EXCISION, MASS, RECTUM, ANAL APPROACH
Anesthesia: General

## 2023-06-05 MED ORDER — OXYCODONE HCL 5 MG/5ML PO SOLN: 5.0000 mg | Freq: Once | ORAL | Status: DC | PRN

## 2023-06-05 MED ORDER — SODIUM CHLORIDE 0.9% FLUSH: 3.0000 mL | Freq: Two times a day (BID) | INTRAVENOUS | Status: DC

## 2023-06-05 MED ORDER — LIDOCAINE HCL (PF) 2 % IJ SOLN
INTRAMUSCULAR | Status: AC
  Filled 2023-06-05: qty 5

## 2023-06-05 MED ORDER — FENTANYL CITRATE (PF) 100 MCG/2ML IJ SOLN
INTRAMUSCULAR | Status: DC | PRN
  Administered 2023-06-05: 50 ug via INTRAVENOUS

## 2023-06-05 MED ORDER — PROPOFOL 10 MG/ML IV BOLUS
INTRAVENOUS | Status: DC | PRN
  Administered 2023-06-05: 110 mg via INTRAVENOUS
  Administered 2023-06-05: 20 mg via INTRAVENOUS

## 2023-06-05 MED ORDER — ONDANSETRON HCL 4 MG/2ML IJ SOLN
INTRAMUSCULAR | Status: AC
  Filled 2023-06-05: qty 2

## 2023-06-05 MED ORDER — DEXAMETHASONE SODIUM PHOSPHATE 10 MG/ML IJ SOLN
INTRAMUSCULAR | Status: DC | PRN
  Administered 2023-06-05: 10 mg via INTRAVENOUS

## 2023-06-05 MED ORDER — SODIUM CHLORIDE 0.9 % IV SOLN
1.0000 g | INTRAVENOUS | Status: DC
  Filled 2023-06-05: qty 1000

## 2023-06-05 MED ORDER — ROCURONIUM BROMIDE 10 MG/ML (PF) SYRINGE
PREFILLED_SYRINGE | INTRAVENOUS | Status: AC
  Filled 2023-06-05: qty 10

## 2023-06-05 MED ORDER — TRAMADOL HCL 50 MG PO TABS
50.0000 mg | ORAL_TABLET | Freq: Four times a day (QID) | ORAL | 0 refills | Status: DC | PRN
Start: 2023-06-05 — End: 2023-06-26

## 2023-06-05 MED ORDER — LIDOCAINE HCL (PF) 2 % IJ SOLN
INTRAMUSCULAR | Status: DC | PRN
Start: 2023-06-05 — End: 2023-06-05
  Administered 2023-06-05: 40 mg via INTRADERMAL

## 2023-06-05 MED ORDER — SUGAMMADEX SODIUM 200 MG/2ML IV SOLN
INTRAVENOUS | Status: DC | PRN
  Administered 2023-06-05: 150 mg via INTRAVENOUS

## 2023-06-05 MED ORDER — LACTATED RINGERS IV SOLN: INTRAVENOUS | Status: DC

## 2023-06-05 MED ORDER — ONDANSETRON HCL 4 MG/2ML IJ SOLN
INTRAMUSCULAR | Status: DC | PRN
Start: 2023-06-05 — End: 2023-06-05
  Administered 2023-06-05: 4 mg via INTRAVENOUS

## 2023-06-05 MED ORDER — PHENYLEPHRINE 80 MCG/ML (10ML) SYRINGE FOR IV PUSH (FOR BLOOD PRESSURE SUPPORT)
PREFILLED_SYRINGE | INTRAVENOUS | Status: DC | PRN
  Administered 2023-06-05 (×2): 80 ug via INTRAVENOUS

## 2023-06-05 MED ORDER — ORAL CARE MOUTH RINSE: 15.0000 mL | Freq: Once | OROMUCOSAL | Status: AC

## 2023-06-05 MED ORDER — 0.9 % SODIUM CHLORIDE (POUR BTL) OPTIME
TOPICAL | Status: DC | PRN
  Administered 2023-06-05: 1000 mL

## 2023-06-05 MED ORDER — PROPOFOL 10 MG/ML IV BOLUS
INTRAVENOUS | Status: AC
  Filled 2023-06-05: qty 20

## 2023-06-05 MED ORDER — ACETAMINOPHEN 500 MG PO TABS
1000.0000 mg | ORAL_TABLET | ORAL | Status: AC
  Administered 2023-06-05: 1000 mg via ORAL
  Filled 2023-06-05: qty 2

## 2023-06-05 MED ORDER — BUPIVACAINE-EPINEPHRINE (PF) 0.25% -1:200000 IJ SOLN
INTRAMUSCULAR | Status: AC
  Filled 2023-06-05: qty 30

## 2023-06-05 MED ORDER — AMISULPRIDE (ANTIEMETIC) 5 MG/2ML IV SOLN: 10.0000 mg | Freq: Once | INTRAVENOUS | Status: DC | PRN

## 2023-06-05 MED ORDER — ROCURONIUM BROMIDE 10 MG/ML (PF) SYRINGE
PREFILLED_SYRINGE | INTRAVENOUS | Status: DC | PRN
  Administered 2023-06-05: 30 mg via INTRAVENOUS

## 2023-06-05 MED ORDER — BUPIVACAINE LIPOSOME 1.3 % IJ SUSP
INTRAMUSCULAR | Status: AC
  Filled 2023-06-05: qty 20

## 2023-06-05 MED ORDER — GABAPENTIN 300 MG PO CAPS
300.0000 mg | ORAL_CAPSULE | ORAL | Status: AC
  Administered 2023-06-05: 300 mg via ORAL
  Filled 2023-06-05: qty 1

## 2023-06-05 MED ORDER — FENTANYL CITRATE PF 50 MCG/ML IJ SOSY: 25.0000 ug | PREFILLED_SYRINGE | INTRAMUSCULAR | Status: DC | PRN

## 2023-06-05 MED ORDER — ACETIC ACID 5 % SOLN
Status: DC | PRN
  Administered 2023-06-05: 1 via TOPICAL

## 2023-06-05 MED ORDER — BUPIVACAINE-EPINEPHRINE 0.25% -1:200000 IJ SOLN
INTRAMUSCULAR | Status: DC | PRN
  Administered 2023-06-05: 30 mL

## 2023-06-05 MED ORDER — ACETAMINOPHEN 500 MG PO TABS: 1000.0000 mg | ORAL_TABLET | Freq: Once | ORAL | Status: DC

## 2023-06-05 MED ORDER — OXYCODONE HCL 5 MG PO TABS: 5.0000 mg | ORAL_TABLET | Freq: Once | ORAL | Status: DC | PRN

## 2023-06-05 MED ORDER — CHLORHEXIDINE GLUCONATE 0.12 % MT SOLN
15.0000 mL | Freq: Once | OROMUCOSAL | Status: AC
  Administered 2023-06-05: 15 mL via OROMUCOSAL

## 2023-06-05 MED ORDER — ACETIC ACID 5 % SOLN
Status: AC
  Filled 2023-06-05: qty 50

## 2023-06-05 MED ORDER — FENTANYL CITRATE (PF) 100 MCG/2ML IJ SOLN
INTRAMUSCULAR | Status: AC
  Filled 2023-06-05: qty 2

## 2023-06-05 MED ORDER — BUPIVACAINE LIPOSOME 1.3 % IJ SUSP
INTRAMUSCULAR | Status: DC | PRN
  Administered 2023-06-05: 20 mL

## 2023-06-05 MED ORDER — SODIUM CHLORIDE (PF) 0.9 % IJ SOLN
INTRAMUSCULAR | Status: AC
  Filled 2023-06-05: qty 20

## 2023-06-05 MED ORDER — DEXAMETHASONE SODIUM PHOSPHATE 10 MG/ML IJ SOLN
INTRAMUSCULAR | Status: AC
  Filled 2023-06-05: qty 1

## 2023-06-05 SURGICAL SUPPLY — 38 items
BAG COUNTER SPONGE SURGICOUNT (BAG) IMPLANT
BAG URO CATCHER STRL LF (MISCELLANEOUS) IMPLANT
BINDER ABDOMINAL 12 ML 46-62 (SOFTGOODS) IMPLANT
BLADE EXTENDED COATED 6.5IN (ELECTRODE) IMPLANT
BRIEF MESH DISP LRG (UNDERPADS AND DIAPERS) IMPLANT
CABLE HIGH FREQUENCY MONO STRZ (ELECTRODE) IMPLANT
DRAPE LAPAROTOMY T 102X78X121 (DRAPES) ×1 IMPLANT
DRAPE SURG IRRIG POUCH 19X23 (DRAPES) ×1 IMPLANT
ELECT REM PT RETURN 15FT ADLT (MISCELLANEOUS) ×1 IMPLANT
GAUZE 4X4 16PLY ~~LOC~~+RFID DBL (SPONGE) IMPLANT
GAUZE SPONGE 4X4 12PLY STRL (GAUZE/BANDAGES/DRESSINGS) IMPLANT
GLOVE BIO SURGEON STRL SZ 6.5 (GLOVE) ×1 IMPLANT
GLOVE INDICATOR 6.5 STRL GRN (GLOVE) ×2 IMPLANT
GOWN STRL REUS W/ TWL XL LVL3 (GOWN DISPOSABLE) ×2 IMPLANT
IRRIG SUCT STRYKERFLOW 2 WTIP (MISCELLANEOUS) ×1 IMPLANT
IRRIGATION SUCT STRKRFLW 2 WTP (MISCELLANEOUS) ×1 IMPLANT
KIT BASIN OR (CUSTOM PROCEDURE TRAY) ×1 IMPLANT
KIT TURNOVER KIT A (KITS) IMPLANT
LEGGING LITHOTOMY PAIR STRL (DRAPES) ×1 IMPLANT
MANIFOLD NEPTUNE II (INSTRUMENTS) ×1 IMPLANT
NS IRRIG 1000ML POUR BTL (IV SOLUTION) ×1 IMPLANT
PENCIL SMOKE EVACUATOR (MISCELLANEOUS) IMPLANT
PLATFORM TRANSANAL ACCESS 4X5 (MISCELLANEOUS) IMPLANT
PLATFORM TRANSANAL ACCESS 4X5. (MISCELLANEOUS) IMPLANT
RETRACTOR LONE STAR DISPOSABLE (INSTRUMENTS) IMPLANT
RETRACTOR STAY HOOK 5MM (MISCELLANEOUS) IMPLANT
SET TUBE SMOKE EVAC HIGH FLOW (TUBING) ×1 IMPLANT
SHEARS HARMONIC 36 ACE (MISCELLANEOUS) ×1 IMPLANT
SURGILUBE 2OZ TUBE FLIPTOP (MISCELLANEOUS) ×1 IMPLANT
SUT CHROMIC 2 0 SH (SUTURE) IMPLANT
SUT STRATA PDS 2-0 15 CT-2.5 (SUTURE) IMPLANT
SUT VIC AB 2-0 SH 27X BRD (SUTURE) IMPLANT
SUT VIC AB 3-0 SH 18 (SUTURE) IMPLANT
SUTURE STRAT PDS 2-0 15 CT-2.5 (SUTURE) IMPLANT
TOWEL OR 17X26 10 PK STRL BLUE (TOWEL DISPOSABLE) ×1 IMPLANT
TRAY FOLEY MTR SLVR 16FR STAT (SET/KITS/TRAYS/PACK) ×1 IMPLANT
TRAY LAPAROSCOPIC (CUSTOM PROCEDURE TRAY) ×1 IMPLANT
TUBING CONNECTING 10 (TUBING) ×1 IMPLANT

## 2023-06-05 NOTE — Transfer of Care (Signed)
 Immediate Anesthesia Transfer of Care Note  Patient: Rebecca [name]  Procedure(s) Performed: Procedure(s): TRANSANAL EXCISION ANAL LESION (N/A)  Patient Location: PACU  Anesthesia Type:General  Level of Consciousness: Patient easily awoken, comfortable, cooperative, following commands, responds to stimulation.   Airway & Oxygen Therapy: Patient spontaneously breathing, ventilating well, oxygen via simple oxygen mask.  Post-op Assessment: Report given to PACU RN, vital signs reviewed and stable, moving all extremities.   Post vital signs: Reviewed and stable.  Complications: No apparent anesthesia complications Last Vitals:  Vitals Value Taken Time  BP 138/79 06/05/23 0949  Temp    Pulse 95 06/05/23 0952  Resp 15 06/05/23 0952  SpO2 100 % 06/05/23 0952  Vitals shown include unfiled device data.  Last Pain:  Vitals:   06/05/23 0637  TempSrc: Oral  PainSc: 0-No pain         Complications: No notable events documented.

## 2023-06-05 NOTE — Anesthesia Procedure Notes (Signed)
 Procedure Name: Intubation Date/Time: 06/05/2023 8:40 AM  Performed by: Ludwig Lean, CRNAPre-anesthesia Checklist: Patient identified, Emergency Drugs available, Suction available and Patient being monitored Patient Re-evaluated:Patient Re-evaluated prior to induction Oxygen Delivery Method: Circle system utilized Preoxygenation: Pre-oxygenation with 100% oxygen Induction Type: IV induction Ventilation: Mask ventilation without difficulty Laryngoscope Size: Mac and 3 Grade View: Grade I Tube type: Oral Tube size: 7.0 mm Number of attempts: 1 Airway Equipment and Method: Stylet Placement Confirmation: ETT inserted through vocal cords under direct vision, positive ETCO2 and breath sounds checked- equal and bilateral Secured at: 20 cm Tube secured with: Tape Dental Injury: Teeth and Oropharynx as per pre-operative assessment  Comments: Grade 1 with anterior pressure

## 2023-06-05 NOTE — Anesthesia Postprocedure Evaluation (Signed)
 Anesthesia Post Note  Patient: Rebecca [name]  Procedure(s) Performed: TRANSANAL EXCISION ANAL LESION     Patient location during evaluation: PACU Anesthesia Type: General Level of consciousness: awake Pain management: pain level controlled Vital Signs Assessment: post-procedure vital signs reviewed and stable Respiratory status: spontaneous breathing, nonlabored ventilation and respiratory function stable Cardiovascular status: blood pressure returned to baseline and stable Postop Assessment: no apparent nausea or vomiting Anesthetic complications: no   No notable events documented.  Last Vitals:  Vitals:   06/05/23 1015 06/05/23 1022  BP: (!) 147/79 137/62  Pulse: 78 76  Resp: 17 15  Temp: 36.4 C   SpO2: 96% 99%    Last Pain:  Vitals:   06/05/23 1022  TempSrc:   PainSc: 0-No pain                 Linton Rump

## 2023-06-05 NOTE — Discharge Instructions (Signed)

## 2023-06-05 NOTE — Interval H&P Note (Signed)
 History and Physical Interval Note:  06/05/2023 8:18 AM  Rebecca [name]  has presented today for surgery, with the diagnosis of ANAL SQUAMOUS CELL CARCINOMA.  The various methods of treatment have been discussed with the patient and family. After consideration of risks, benefits and other options for treatment, the patient has consented to  Procedure(s): TRANSANAL EXCISION (N/A) as a surgical intervention.  The patient's history has been reviewed, patient examined, no change in status, stable for surgery.  I have reviewed the patient's chart and labs.  Questions were answered to the patient's satisfaction.     Vanita Panda, MD  Colorectal and General Surgery [facility] Surgery

## 2023-06-05 NOTE — Op Note (Signed)
 06/05/2023  9:31 AM  PATIENT:  Rebecca [name]  73 y.o. female  Patient Care Team: Ngetich, Donalee Citrin, NP as PCP - General (Family Medicine) Sharon Seller, NP (Geriatric Medicine) Jerrell Mylar Jacquelyne Balint, RN as Oncology Nurse Navigator  PRE-OPERATIVE DIAGNOSIS:  ANAL SQUAMOUS CELL CARCINOMA  POST-OPERATIVE DIAGNOSIS:  ANAL SQUAMOUS CELL CARCINOMA  PROCEDURE:  TRANSANAL EXCISION ANAL LESION   Surgeon(s): Romie Levee, MD  ASSISTANT: none   ANESTHESIA:   local and general  SPECIMEN:  Source of Specimen:  R anterior anorectal junction  DISPOSITION OF SPECIMEN:  PATHOLOGY  COUNTS:  YES  PLAN OF CARE: Discharge to home after PACU  PATIENT DISPOSITION:  PACU - hemodynamically stable.  INDICATION: 73 y.o. F with anal mass biopsied during colonoscopy which showed anal SCC.  This appeared to not be invading into underlying sphincter complex so transanal excision was recommended.     OR FINDINGS: anal mass ~1cm in diameter, R anterior anorectal junction  DESCRIPTION: the patient was identified in the preoperative holding area and taken to the OR where they were laid on the operating room table.  General anesthesia was induced without difficulty. The patient was then positioned in prone jackknife position with buttocks gently taped apart.  The patient was then prepped and draped in usual sterile fashion.  SCDs were noted to be in place prior to the initiation of anesthesia. A surgical timeout was performed indicating the correct patient, procedure, positioning and need for preoperative antibiotics.  A rectal block was performed using Marcaine with epinephrine mixed with Experel.    I began with a digital rectal exam.  The mass could be palpated in the right anterior anal canal proximally.  I then placed a Hill-Ferguson anoscope into the anal canal and evaluated this completely.  There was nodularity noted in this area for approximately 2 to 3 cm.  I placed an acetic acid soaked sponge on the  area and allowed this to sit for approximately 2 minutes.  I then reevaluated the area.  The nodularity did not show much acetic acid uptake.  There was a concentration of acetic acid uptake in the center where the raised mass was located.  I marked out borders around the mass approximately 5 mm circumferentially.  I used Marcaine with epinephrine to lift the structure off of the underlying sphincter complex.  I then began distally approximately 2 to 3 mm proximal to the dentate line.  I continued to the level of the sphincter complex and carefully separated the sphincter complex from the mucosal layer.  I then came underneath of the muscularis to take a full-thickness rectal biopsy.  I continued this to my proximal margin.  The specimen was free, this was sent to pathology for further examination.  The rectal mucosa was closed horizontally using interrupted 3-0 Vicryl sutures.  The anoderm was closed vertically using interrupted 2-0 chromic sutures.  Hemostasis was good.  Additional Marcaine mixed with Exparel was placed underneath the incision site for postoperative pain control.  A dressing was applied the patient was awakened from anesthesia and sent to the postanesthesia care unit in stable condition.  All counts were correct per operating room staff.  Vanita Panda, MD  Colorectal and General Surgery [facility] Surgery

## 2023-06-06 ENCOUNTER — Encounter (HOSPITAL_COMMUNITY): Payer: Self-pay | Admitting: General Surgery

## 2023-06-06 LAB — SURGICAL PATHOLOGY

## 2023-06-12 ENCOUNTER — Encounter: Payer: Self-pay | Admitting: General Practice

## 2023-06-12 NOTE — Progress Notes (Signed)
 [facility] Spiritual Care Note  Followed up with Cyndel and husband Ivan Croft in Spiritual Care office for opportunity to process post-op pathology results and discernment questions about moving forward. Provided compassionate presence, empathic listening, emotional support, and normalization of feelings. Made a running list of their questions for Dierdre's physician team. Provided resources for creative spiritual practice and ritual, including dissolving paper for ritual release, [employer] Personal assistant cards, and packet of papercraft resources.  We set a follow-up appointment to debrief after Rubyann's post-op checkup, when they will discuss more about her future care plan, for Thursday, March 17 at 10am.    [facility] Hillery Aldo, [facility] Pager [phone] Voicemail [phone]

## 2023-06-13 ENCOUNTER — Telehealth: Payer: Self-pay | Admitting: Oncology

## 2023-06-13 ENCOUNTER — Encounter: Payer: Self-pay | Admitting: *Deleted

## 2023-06-13 NOTE — Progress Notes (Signed)
 Pathology shows positive surgical margins. Case will be presented at GI Conference on 07/03/23 per Dr. Maisie Fus. Dr. Truett Perna will see her that [place] in clinic. Scheduling message sent

## 2023-06-13 NOTE — Telephone Encounter (Signed)
 Spoke with patient confirming upcoming appointment

## 2023-06-18 ENCOUNTER — Inpatient Hospital Stay: Attending: Oncology | Admitting: Nutrition

## 2023-06-18 DIAGNOSIS — C218 Malignant neoplasm of overlapping sites of rectum, anus and anal canal: Secondary | ICD-10-CM | POA: Insufficient documentation

## 2023-06-18 DIAGNOSIS — J45909 Unspecified asthma, uncomplicated: Secondary | ICD-10-CM | POA: Insufficient documentation

## 2023-06-18 DIAGNOSIS — M810 Age-related osteoporosis without current pathological fracture: Secondary | ICD-10-CM | POA: Insufficient documentation

## 2023-06-18 NOTE — Progress Notes (Signed)
 73 year old female diagnosed with anal cancer status post resection on March 5.  She is a patient of Dr. Truett Perna.  Further treatment plans being discussed.  Past medical history includes anemia, asthma, osteoporosis, and shingles.  Medications were reviewed.  Labs reviewed.  Height: 5 feet 4 inches. Weight: 100 pounds 6.4 ounces on March 5. Usual body weight: 100 pounds per patient. BMI: 17.23.  Patient requesting information on ways to increase calories and protein to help with weight gain.  Reports she is eating a regular diet.  Her constipation has improved with stool softener.  She takes 1 Colace every night.  States bowels are almost back to normal.  Her energy has increased.  She enjoys chicken and fish but does not eat red meat.  She tries to eat vegetables and nuts.  She has been drinking Ensure max protein shakes.  She also likes Luna and [employer].  Drinks adequate water.  Nutrition diagnosis: Food and nutrition related knowledge deficit related to anal cancer and associated treatments as evidenced by no previous education needed.  Intervention: Educated on importance of increased calories and protein in small frequent meals with snacks.  Encouraged her to set a timer to remind her to eat. Try to increase oral nutrition supplements twice daily.  Recommend changing Ensure max to ensure plus or equivalent.  Provided samples and coupons. Encouraged patient to include complex carbohydrates and minimize simple sugars. Reviewed sources of healthy fats. Nutrition fact sheets provided.  Contact information given.  Monitoring, evaluation, goals: Patient will tolerate adequate calories and protein to promote continued healing and minimize weight loss.  Next visit: To be scheduled as needed based on treatment plan.  **Disclaimer: This note was dictated with voice recognition software. Similar sounding words can inadvertently be transcribed and this note may contain transcription errors  which may not have been corrected upon publication of note.**

## 2023-06-19 ENCOUNTER — Other Ambulatory Visit: Payer: Self-pay

## 2023-06-20 ENCOUNTER — Telehealth: Payer: Self-pay | Admitting: Radiation Oncology

## 2023-06-20 NOTE — Telephone Encounter (Signed)
LVM to sched CON with Dr. Moody 

## 2023-06-21 NOTE — Progress Notes (Signed)
 The proposed treatment discussed in conference is for discussion purpose only and is not a binding recommendation.  The patients have not been physically examined, or presented with their treatment options.  Therefore, final treatment plans cannot be decided.

## 2023-06-24 NOTE — Progress Notes (Signed)
 Radiation Oncology         ([phone]) [phone] ________________________________  Name: Rebecca [name]        MRN: [id number]  Date of Service: 06/26/2023 DOB: 12-19-1950  WU:[id number], Donalee Citrin, NP  Romie Levee, MD     REFERRING PHYSICIAN: Romie Levee, MD   DIAGNOSIS: The encounter diagnosis was Anal cancer [facility]).   HISTORY OF PRESENT ILLNESS: Rebecca [name] is a 73 y.o. female seen at the request of Dr. Truett Perna for a newly diagnosed anal carcinoma. The patient was seen by Dr. Maisie Fus following a surveillance colonoscopy for a history of polyps. The colonoscopy with Dr. Rhea Belton on 05/06/23 identified sub centimeter polyps in the transverse and descending colon as well as firm nodular area along the distal rectum at the anal verge. This showed sessile serrated polyp in the cecal location and tubular adenoma, and sessile polyp along the transverse and descending colon. However the distal rectal biopsy at the anal verge showed moderate to poorly differentiated squamous cell carcinoma. A PET scan on 05/21/23 showed hypermetabolic change at the anorectal junction with an SUV of 5 but no additional areas of metastasis were noted. She underwent transanal resection with Dr. Maisie Fus on 06/05/23 which showed a 1.5 cm moderate to poorly differentiated squamous cell carcinoma invading the submucosa, and present at the distal margin and posterior margin. High grade dysplasia was focally present in the anterior and posterior margins also. Given the positive margins, her case was discussed in multidisciplinary GI oncology conference. She met with Dr. Truett Perna to discuss chemoradiation and is seen to review this as well with [place]. She has an upcoming trip to [place] planned between 07/03/23 and 07/15/23. She has requested to postpone treatment until she returns.     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)        PAST SURGICAL HISTORY: Past Surgical History:  Procedure Laterality Date   COLONOSCOPY  07/2017   JMP-MAC-goly(good)-tics/SSP x 1   OPEN REDUCTION INTERNAL FIXATION (ORIF) DISTAL RADIAL FRACTURE Left 01/04/2021   Procedure: OPEN REDUCTION INTERNAL FIXATION (ORIF) DISTAL RADIAL FRACTURE;  Surgeon: Roby Lofts, MD;  Location: [facility] OR;  Service: Orthopedics;  Laterality: Left;   ORIF TIBIA PLATEAU Right 03/16/2020   Procedure: OPEN REDUCTION INTERNAL FIXATION (ORIF) TIBIAL PLATEAU;  Surgeon: Roby Lofts, MD;  Location: [facility] OR;  Service: Orthopedics;  Laterality: Right;   POLYPECTOMY  2019   SSP x 1   TRANSANAL EXCISION OF RECTAL MASS N/A 06/05/2023   Procedure: TRANSANAL EXCISION ANAL LESION;  Surgeon: Romie Levee, MD;  Location: [facility] OR[facility];  Service: General;  Laterality: N/A;   WISDOM TOOTH EXTRACTION       FAMILY HISTORY:  Family History  Problem Relation Age of Onset   Colon polyps Mother 11   Cancer - Colon Mother 21   Osteoporosis Mother    Bipolar disorder Mother    Mental illness Mother    Colon cancer Mother 13   Other Father 30       airplane accident   Diabetes Mellitus II Paternal Grandfather    Anxiety disorder Daughter 44   Esophageal cancer Neg Hx    Stomach cancer Neg Hx  Rectal cancer Neg Hx      SOCIAL HISTORY:  reports that she has never smoked. She has never used smokeless tobacco. She reports current alcohol use of about 7.0 standard drinks of alcohol per week. She reports that she does not use drugs.   ALLERGIES: Patient has no known allergies.   MEDICATIONS:  Current Outpatient Medications  Medication Sig Dispense Refill   albuterol (VENTOLIN HFA) 108 (90 Base) MCG/ACT inhaler Inhale 1 puff into the lungs every 6 (six) hours as needed for wheezing or shortness of breath. 18 g 3   budesonide-formoterol (SYMBICORT)  80-4.5 MCG/ACT inhaler INHALE 2 PUFFS BY MOUTH TWICE A Azar 10.2 each 3   fexofenadine (ALLEGRA) 180 MG tablet Take 1 tablet (180 mg total) by mouth daily. (Patient taking differently: Take 180 mg by mouth every evening.)     rosuvastatin (CRESTOR) 5 MG tablet TAKE 1 TABLET BY MOUTH ONCE DAILY (Patient taking differently: Take 5 mg by mouth every evening.) 90 tablet 1   traMADol (ULTRAM) 50 MG tablet Take 1-2 tablets (50-100 mg total) by mouth every 6 (six) hours as needed. 30 tablet 0   No current facility-administered medications for this visit.     REVIEW OF SYSTEMS: On review of systems, the patient reports that ***     PHYSICAL EXAM:  Wt Readings from Last 3 Encounters:  06/05/23 100 lb 6.4 oz (45.5 kg)  05/27/23 100 lb 6.4 oz (45.5 kg)  05/22/23 102 lb 8 oz (46.5 kg)   Temp Readings from Last 3 Encounters:  06/05/23 97.6 F (36.4 C)  05/27/23 98.1 F (36.7 C) (Oral)  05/22/23 97.9 F (36.6 C) (Temporal)   BP Readings from Last 3 Encounters:  06/05/23 137/62  05/27/23 129/70  05/22/23 117/67   Pulse Readings from Last 3 Encounters:  06/05/23 76  05/27/23 91  05/22/23 97    /10  In general this is a thin appearing Caucasian female in no acute distress. She's alert and oriented x4 and appropriate throughout the examination. Cardiopulmonary assessment is negative for acute distress and she exhibits normal effort.     ECOG = ***  0 - Asymptomatic (Fully active, able to carry on all predisease activities without restriction)  1 - Symptomatic but completely ambulatory (Restricted in physically strenuous activity but ambulatory and able to carry out work of a light or sedentary nature. For example, light housework, office work)  2 - Symptomatic, <50% in bed during the Heo (Ambulatory and capable of all self care but unable to carry out any work activities. Up and about more than 50% of waking hours)  3 - Symptomatic, >50% in bed, but not bedbound (Capable of only  limited self-care, confined to bed or chair 50% or more of waking hours)  4 - Bedbound (Completely disabled. Cannot carry on any self-care. Totally confined to bed or chair)  5 - Death   Rebecca [name] MM, Creech RH, Tormey DC, et al. [phone]). "Toxicity and response criteria of the [facility]". Am. Evlyn Clines. Oncol. 5 (6): 649-55    LABORATORY DATA:  Lab Results  Component Value Date   WBC 8.0 05/27/2023   HGB 15.7 (H) 05/27/2023   HCT 47.9 (H) 05/27/2023   MCV 93.2 05/27/2023   PLT 365 05/27/2023   Lab Results  Component Value Date   NA 138 01/17/2023   K 4.9 01/17/2023   CL 99 01/17/2023   CO2 29 01/17/2023   Lab Results  Component Value Date  ALT 14 01/17/2023   AST 17 01/17/2023   ALKPHOS 70 03/14/2020   BILITOT 0.5 01/17/2023      RADIOGRAPHY: MR PELVIS WO CM RECTAL CA STAGING Result Date: 05/28/2023 CLINICAL DATA:  Squamous low anal carcinoma. EXAM: MRI PELVIS WITHOUT CONTRAST TECHNIQUE: Multiplanar multisequence MR imaging of the pelvis was performed. No intravenous contrast was administered. Ultrasound gel was administered per rectum to optimize tumor evaluation. COMPARISON:  PET-CT 05/21/2023.  Standard CT 05/14/2023. FINDINGS: TUMOR LOCATION Subtle focal area of nodular soft tissue thickening identified along the extreme distal rectum posteriorly with extension along the posterior aspect of the upper anal canal as suggested on sagittal series 2, image 19 and 18. This corresponds to the area of uptake on PET-CT . Subtle restricted diffusion on series 4, image 27 of the ADC map. No clear extension through the internal sphincter or along the intersphincteric space at this time. No extension into the ischial anal fossa. No definite abnormal lymph node enlargement seen in the pelvis. No free fluid in the pelvis. No marrow or soft tissue edema. There are sigmoid colon diverticula identified. Preserved contour to the urinary bladder. Uterus is present. No separate  adnexal mass. IMPRESSION: Subtle area of thickening along the extreme low rectum posteriorly extension along the upper anal canal . Subtle restricted diffusion in this location as well. Please correlate with the exact location of the patient's known neoplasm at colonoscopy. No extension deeper along the bowel wall. No clear extension through the internal sphincter or into the intersphincteric space. No abnormal lymph nodes in the visualized portions of the pelvis. Please correlate with other imaging. Electronically Signed   By: Johari Kays M.D.   On: 05/28/2023 20:07       IMPRESSION/PLAN: 1. Squamous Cell Carcinoma of the Anus. Dr. Mitzi Hansen discusses the pathology findings and reviews the nature of anal carcinoma. Given the margins from her transanal resection, Dr. Mitzi Hansen discusses the role of chemoradiation to reduce risks of local recurrence.  We discussed the risks, benefits, short, and long term effects of radiotherapy, as well as the curative intent, and the patient is interested in proceeding. Dr. Mitzi Hansen discusses the delivery and logistics of radiotherapy and anticipates a course of 5 1/2 weeks of radiotherapy to the anus and regional nodes of the pelvis. Written consent is obtained and placed in the chart, a copy was provided to the patient. She will simulate on *** and we anticipate starting radiation on 07/16/23. *** 2. Risks of pelvic floor dysfunction from radiotherapy. We discussed the importance of evaluation with physical therapy prior to pelvic radiation. A referral was placed to physical therapy today.    In a visit lasting *** minutes, greater than 50% of the time was spent face to face discussing the patient's condition, in preparation for the discussion, and coordinating the patient's care.   The above documentation reflects my direct findings during this shared patient visit. Please see the separate note by Dr. Mitzi Hansen on this date for the remainder of the patient's plan of  care.    Osker Mason, [facility]   **Disclaimer: This note was dictated with voice recognition software. Similar sounding words can inadvertently be transcribed and this note may contain transcription errors which may not have been corrected upon publication of note.**

## 2023-06-26 ENCOUNTER — Ambulatory Visit
Admission: RE | Admit: 2023-06-26 | Discharge: 2023-06-26 | Disposition: A | Discharge status: Home / Self Care | Source: Ambulatory Visit | Attending: Radiation Oncology | Admitting: Radiation Oncology

## 2023-06-26 ENCOUNTER — Encounter: Payer: Self-pay | Admitting: Radiation Oncology

## 2023-06-26 VITALS — BP 123/66 | HR 87 | Temp 98.5°F | Resp 18 | Ht 64.0 in | Wt 101.8 lb

## 2023-06-26 DIAGNOSIS — E785 Hyperlipidemia, unspecified: Secondary | ICD-10-CM | POA: Insufficient documentation

## 2023-06-26 DIAGNOSIS — C210 Malignant neoplasm of anus, unspecified: Secondary | ICD-10-CM

## 2023-06-26 DIAGNOSIS — C44520 Squamous cell carcinoma of anal skin: Secondary | ICD-10-CM | POA: Diagnosis not present

## 2023-06-26 DIAGNOSIS — M810 Age-related osteoporosis without current pathological fracture: Secondary | ICD-10-CM | POA: Insufficient documentation

## 2023-06-26 DIAGNOSIS — Z7951 Long term (current) use of inhaled steroids: Secondary | ICD-10-CM | POA: Diagnosis not present

## 2023-06-26 DIAGNOSIS — J45909 Unspecified asthma, uncomplicated: Secondary | ICD-10-CM | POA: Diagnosis not present

## 2023-06-26 DIAGNOSIS — Z79899 Other long term (current) drug therapy: Secondary | ICD-10-CM | POA: Diagnosis not present

## 2023-06-26 NOTE — Progress Notes (Signed)
 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  Colonoscopy on 05/06/2023 identified sub centimeter polyps in the transverse and descending colon as well as firm nodular area along the distal rectum at the anal verge.    Biopsies of Anal Lesion 06/05/2023    Past/Anticipated interventions by surgeon, if any:  Dr. Maisie Fus -Transanal resection 06/05/2023. - Positive margins   Past/Anticipated interventions by medical oncology, if any:  Dr. Truett Perna 05/22/2023 -[name] would like to proceed with surgery if the MRI does not reveal more advanced disease. She understands it is possible the tumor will be unresectable via a transanal approach. She also understands chemotherapy/radiation may recommended based on the surgical pathology findings.  -Follow-up 06/28/2023   Weight changes, if any: No   Bowel/Bladder complaints, if any: Regular, taking colace daily. Denies bladder changes.  Nausea / Vomiting, if any: No  Pain issues, if any:  no  Any blood per rectum: No    SAFETY ISSUES: Prior radiation? No Pacemaker/ICD? No Possible current pregnancy? Postmenopausal Is the patient on methotrexate? No  Current Complaints/Details:

## 2023-06-27 ENCOUNTER — Other Ambulatory Visit: Payer: Self-pay | Admitting: Radiation Oncology

## 2023-06-27 ENCOUNTER — Encounter: Payer: Self-pay | Admitting: General Practice

## 2023-06-27 ENCOUNTER — Telehealth: Payer: Self-pay | Admitting: Radiation Oncology

## 2023-06-27 DIAGNOSIS — C44520 Squamous cell carcinoma of anal skin: Secondary | ICD-10-CM | POA: Diagnosis not present

## 2023-06-27 DIAGNOSIS — C210 Malignant neoplasm of anus, unspecified: Secondary | ICD-10-CM

## 2023-06-27 NOTE — Progress Notes (Signed)
 [facility] Spiritual Care Note  Had follow-up Spiritual Care visit with Rebecca [name] and husband Rebecca [name] in chaplain's office as planned. They used the opportunity to process updates about her treatment plan and upcoming trip to [place], [place] (beginning 4/2). Both Rebecca [name] and Rebecca [name] have personal creative practices that they use for reflection and growth. Rebecca [name] brought collages to share today about her "storm journey" through diagnosis and treatment.  Provided compassionate presence, empathic listening, emotional support, normalization of feelings, and witness to their stories. Rebecca [name] scheduled a caregiving support session for 4/10, and Rebecca [name] plans to schedule a follow-up after her pilgrimage.   [address] [place], [place], [facility] Pager [phone] Voicemail [phone]

## 2023-06-27 NOTE — Telephone Encounter (Signed)
 I was able to speak with Dr. Abelina Bachelor in pathology, P16 stain was not performed. I contacted the patient to let her know this could be performed but after discussing the testing cost and that it would not change her treatment recommendations, she elects to forgo this. She is informed of the importance of yearly gynecologic visits for evaluation though pap smear guidelines may not apply for cervical surveillance.

## 2023-06-28 ENCOUNTER — Inpatient Hospital Stay: Admitting: Oncology

## 2023-06-28 ENCOUNTER — Other Ambulatory Visit: Payer: Self-pay | Admitting: *Deleted

## 2023-06-28 ENCOUNTER — Other Ambulatory Visit: Payer: Self-pay | Admitting: Nurse Practitioner

## 2023-06-28 VITALS — BP 102/61 | HR 87 | Temp 98.1°F | Resp 18 | Ht 64.0 in | Wt 102.8 lb

## 2023-06-28 DIAGNOSIS — C210 Malignant neoplasm of anus, unspecified: Secondary | ICD-10-CM | POA: Diagnosis not present

## 2023-06-28 DIAGNOSIS — J45909 Unspecified asthma, uncomplicated: Secondary | ICD-10-CM | POA: Diagnosis not present

## 2023-06-28 DIAGNOSIS — C211 Malignant neoplasm of anal canal: Secondary | ICD-10-CM

## 2023-06-28 DIAGNOSIS — M810 Age-related osteoporosis without current pathological fracture: Secondary | ICD-10-CM | POA: Diagnosis not present

## 2023-06-28 DIAGNOSIS — C218 Malignant neoplasm of overlapping sites of rectum, anus and anal canal: Secondary | ICD-10-CM | POA: Diagnosis not present

## 2023-06-28 MED ORDER — PROCHLORPERAZINE MALEATE 10 MG PO TABS: 10.0000 mg | ORAL_TABLET | Freq: Four times a day (QID) | ORAL | 3 refills | Status: DC | PRN

## 2023-06-28 NOTE — Progress Notes (Signed)
 START ON PATHWAY REGIMEN - Anal Carcinoma     One cycle, concurrent with RT:     Fluorouracil      Mitomycin   **Always confirm dose/schedule in your pharmacy ordering system**  Patient Characteristics: Anal Canal Tumors, Newly Diagnosed - Locoregional Disease (Clinical Staging) Therapeutic Status: Newly Diagnosed - Locoregional Disease (Clinical Staging) AJCC T Category: cT1 AJCC N Category: cN0 AJCC M Category: cM0 AJCC 9 Stage Grouping: I Check here if patient was staged using an edition other than AJCC Staging 9th Edition: false Intent of Therapy: Curative Intent, Discussed with Patient

## 2023-06-28 NOTE — Progress Notes (Signed)
 [facility] OFFICE PROGRESS NOTE   Diagnosis: Anal cancer  INTERVAL HISTORY:   Rebecca [name] underwent transanal excision of the anal mass from the right anterior anal rectal junction on 06/05/2023.  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  Tumor invaded the submucosa.  Invasive carcinoma was present at the distal and deep posterior margins.  High-grade dysplasia was present in the anterior and posterior margins. She was referred to radiation oncology.  She is scheduled to begin radiation on 07/22/2023.  Her case was presented at the GI tumor conference 06/19/2023.  Concurrent chemotherapy and radiation is recommended.  [name] feels well.  She has no rectal pain or bleeding.  She has mild seasonal allergy symptoms. Objective:  Vital signs in last 24 hours:  Blood pressure 102/61, pulse 87, temperature 98.1 F (36.7 C), temperature source Temporal, resp. rate 18, height 5\' 4"  (1.626 m), weight 102 lb 12.8 oz (46.6 kg), SpO2 96%.    Lymphatics: No cervical or supraclavicular nodes.  Few "shotty "axillary and inguinal nodes. Resp: Mild bilateral expiratory wheeze, no respiratory distress Cardio: Regular rate and rhythm GI: No hepatosplenomegaly, no mass, nontender Vascular: No leg edema   Lab Results:  Lab Results  Component Value Date   WBC 8.0 05/27/2023   HGB 15.7 (H) 05/27/2023   HCT 47.9 (H) 05/27/2023   MCV 93.2 05/27/2023   PLT 365 05/27/2023   NEUTROABS 3,040 01/17/2023    CMP  Lab Results  Component Value Date   NA 138 01/17/2023   K 4.9 01/17/2023   CL 99 01/17/2023   CO2 29 01/17/2023   GLUCOSE 91 01/17/2023   BUN 11 01/17/2023   CREATININE 0.74 01/17/2023   CALCIUM 9.6 01/17/2023   PROT 7.0 01/17/2023   ALBUMIN 3.9 03/14/2020   AST 17 01/17/2023   ALT 14 01/17/2023   ALKPHOS 70 03/14/2020   BILITOT 0.5 01/17/2023   GFRNONAA >60 03/24/2020   GFRAA 92 12/04/2018    No results found for:  "CEA1", "CEA", "ZOX096", "CA125"  Lab Results  Component Value Date   INR 0.9 03/14/2020   LABPROT 12.1 03/14/2020    Imaging:  No results found.  Medications: I have reviewed the patient's current medications.   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  High-grade dysplasia is present at the anterior and posterior margins, distally Colon polyps on colonoscopy 05/06/2023: Sessile serrated polyps and tubular adenoma Colonoscopy 08/21/2017: Sessile serrated polyps at the ascending and hepatic flexure, biopsy of polypoid mucosa at the distal rectum near the dentate line-benign polypoid mucosa with a lymphoid aggregate 3.   Osteoporosis 4.   Asthma 5.  Vesicular lip/external nose lesions on exam 05/16/2023     Disposition: Rebecca [name] has been diagnosed with cell carcinoma illness.  She underwent anal excision of an mass on 06/05/2023.  The surgical margins returned positive.  Her case was presented at the GI tumor conference.  Chemotherapy/radiation is  recommended.  I recommend 5-FU/Mitomycin-C to be given concurrent with radiation.  We reviewed potential toxicities associated with the chemotherapy regimen including the chance of nausea, mucositis, diarrhea, alopecia, hematologic  toxicity, infection, and bleeding.  We discussed the sun sensitivity, rash, hyperpigmentation, and hand/foot syndrome associated with 5-fluorouracil.  We discussed cardiac toxicity associated with 5-fluorouracil.  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  We discussed the risk of skin breakdown at the groin/perineum.  She agrees to proceed.  Ms. Compere will attend a chemotherapy teaching class.  She will be referred for a chemotherapy class on 07/18/2023.  She will undergo PICC placement 07/19/2023.  The plan is to begin concurrent chemotherapy and radiation on 07/22/2023.  A treatment plan was entered today.  Thornton Papas, MD  06/28/2023  2:09 PM

## 2023-06-28 NOTE — Progress Notes (Signed)
PICC line order placed

## 2023-06-29 ENCOUNTER — Other Ambulatory Visit: Payer: Self-pay

## 2023-07-01 ENCOUNTER — Other Ambulatory Visit: Payer: Self-pay | Admitting: Oncology

## 2023-07-02 ENCOUNTER — Other Ambulatory Visit: Payer: Self-pay | Admitting: Family

## 2023-07-02 ENCOUNTER — Other Ambulatory Visit: Payer: Self-pay

## 2023-07-02 ENCOUNTER — Ambulatory Visit: Attending: Radiation Oncology

## 2023-07-02 DIAGNOSIS — C210 Malignant neoplasm of anus, unspecified: Secondary | ICD-10-CM | POA: Diagnosis not present

## 2023-07-02 DIAGNOSIS — M6281 Muscle weakness (generalized): Secondary | ICD-10-CM | POA: Insufficient documentation

## 2023-07-02 DIAGNOSIS — R279 Unspecified lack of coordination: Secondary | ICD-10-CM | POA: Diagnosis not present

## 2023-07-02 DIAGNOSIS — R293 Abnormal posture: Secondary | ICD-10-CM | POA: Insufficient documentation

## 2023-07-02 NOTE — Patient Instructions (Addendum)
 Squatty potty: When your knees are level or below the level of your hips, pelvic floor muscles are pressed against rectum, preventing ease of bowel movement. By getting knees above the level of the hips, these pelvic floor muscles relax, allowing easier passage of bowel movement. Ways to get knees above hips: o Squatty Potty (7inch and 9inch versions) o Small stool o Roll of toilet paper under each foot o Hardback book or stack of magazines under each foot  Relaxed Toileting mechanics: Once in this position, make sure to lean forward with forearms on thighs, wide knees, relaxed stomach, and breathe.   Bowel massage: To assist with more regular and more comfortable bowel movements, try performing bowel massage nightly for 5-10 minutes. Place hands in the lower right side of your abdomen to start; in small circles, massage up, across, and down the left side of your abdomen. Pressure does not need to be hard, but just comfortable. You can use lotion or oil to make more comfortable.    Moisturizers They are used in the vagina to hydrate the mucous membrane that make up the vaginal canal. Designed to keep a more normal acid balance (ph) Once placed in the vagina, it will last between two to three days.  Use 2-3 times per week at bedtime  Ingredients to avoid is glycerin and fragrance, can increase chance of infection Should not be used just before sex due to causing irritation Most are gels administered either in a tampon-shaped applicator or as a vaginal suppository. They are non-hormonal.   Types of Moisturizers(internal use)  Vitamin E vaginal suppositories- Whole foods, Amazon Moist Again Coconut oil- can break down condoms, any grocery store (prefer organic) Julva- (Do no use if taking  Tamoxifen) amazon Yes moisturizer- amazon NeuEve Silk , NeuEve Silver for menopausal or over 65 (if have severe vaginal atrophy or cancer treatments use NeuEve Silk for  1 month than move to  [employer])- [employer], Harper.com Olive and Bee intimate cream- www.oliveandbee.com.au Mae vaginal moisturizer- Amazon Aloe Good Clean Love Hyaluronic acid Hyalofemme Reveree hyaluronic acid inserts   Creams to use externally on the Vulva area Marathon Oil (good for for cancer patients that had radiation to the area)- [place] or [employer].https://garcia-[email or web address]/ Vulva Balm/ V-magic cream by medicine mama- amazon Julva-amazon Vital "V Wild Yam salve ( help moisturize and help with thinning vulvar area, does have Beeswax MoodMaid Botanical Pro-Meno Wild Yam Cream- Amazon Desert Harvest Gele Cleo by Zane Herald labial moisturizer (Amazon),  Coconut or olive oil aloe Good Clean Love Enchanted Rose by intimate rose  Things to avoid in the vaginal area Do not use things to irritate the vulvar area No lotions just specialized creams for the vulva area- Neogyn, V-magic,  No soaps; can use Aveeno or Calendula cleanser, unscented Dove if needed. Must be gentle No deodorants No douches Good to sleep without underwear to let the vaginal area to air out No scrubbing: spread the lips to let warm water rinse over labias and pat dry      Lubrication Used for intercourse to reduce friction Avoid ones that have glycerin, nonoxynol-9, petroleum, propylene glycol, chlorhexidine gluconate, warming gels, tingling gels, icing or cooling gel, scented Avoid parabens due to a preservative similar to female sex hormone May need to be reapplied once or several times during sexual activity Can be applied to both partners genitals prior to vaginal penetration to minimize friction or irritation Prevent irritation and mucosal tears that cause post coital pain and increased the risk  of vaginal and urinary tract infections Oil-based lubricants cannot be used with condoms due to breaking them down.  Least likely to irritate vaginal tissue.  Plant based-lubes are safe Silicone-based lubrication are thicker and  last long and used for post-menopausal women  Vaginal Lubricators Here is a list of some suggested lubricators you can use for intercourse. Use the most hypoallergenic product.  You can place on you or your partner.  Slippery Stuff ( water based) Sylk or Sliquid Natural H2O ( good  if frequent UTI's)- walmart, amazon Sliquid organics silk-(aloe and silicone based ) [employer] (www.blossom-organics.com)- (aloe based ) Coconut oil, olive oil -not good with condoms  PJur Woman Nude- (water based) amazon Uberlube- ( silicon) Amazon Aloe Vera- Sprouts has an organic one Yes lubricant- (water based and has plant oil based similar to silicone) Loews Corporation Platinum-Silicone, Target, Walgreens Olive and Bee intimate cream-  www.oliveandbee.com.au Pink - International Paper Erosense Sync- walmart, amazon Coconu- coconu.com Desert Hallib[employer] Company Good Clean Love lubricants  Things to avoid in lubricants are glycerin, warming gels, tingling gels, icing or cooling  gels, and scented gels.  Also avoid Vaseline. KY jelly,  and Astroglide contain chlorhexidine which kills good bacteria(lactobacilli)  Things to avoid in the vaginal area Do not use things to irritate the vulvar area No lotions- see below Soaps you  can use :Aveeno, Calendula, Good Clean Love cleanser if needed. Must be gentle No deodorants No douches Good to sleep without underwear to let the vaginal area to air out No scrubbing: spread the lips to let warm water rinse over labias and pat dry  Creams that can be used on the Vulva Area V [employer], walmart Vital V Wild Yam Salve Julva- [employer] Botanical Pro-Meno Wild Yam Cream Coconut oil, olive oil Cleo by [employer] labial moisturizer -Dana Corporation,  Desert [place] Releveum ( lidocaine) or Desert [employer]ib[employer] Company Gele Yes [facility] [address] [place], [place] [address] Phone # [phone] Fax [phone]

## 2023-07-02 NOTE — Therapy (Signed)
 OUTPATIENT PHYSICAL THERAPY FEMALE PELVIC EVALUATION   Patient Name: Rebecca [name] MRN: [id number] DOB:03/21/1951, 73 y.o., female Today's Date: 07/02/2023  END OF SESSION:  PT End of Session - 07/02/23 1429     Visit Number 1    Date for PT Re-Evaluation 09/24/23    Authorization Type [employer] Medicare    Progress Note Due on Visit 10    PT Start Time 1445    PT Stop Time 1534    PT Time Calculation (min) 49 min    Activity Tolerance Patient tolerated treatment well    Behavior During Therapy WFL for tasks assessed/performed             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)    Past Surgical History:  Procedure Laterality Date   COLONOSCOPY  07/2017   JMP-MAC-goly(good)-tics/SSP x 1   OPEN REDUCTION INTERNAL FIXATION (ORIF) DISTAL RADIAL FRACTURE Left 01/04/2021   Procedure: OPEN REDUCTION INTERNAL FIXATION (ORIF) DISTAL RADIAL FRACTURE;  Surgeon: Roby Lofts, MD;  Location: [facility] OR;  Service: Orthopedics;  Laterality: Left;   ORIF TIBIA PLATEAU Right 03/16/2020   Procedure: OPEN REDUCTION INTERNAL FIXATION (ORIF) TIBIAL PLATEAU;  Surgeon: Roby Lofts, MD;  Location: [facility] OR;  Service: Orthopedics;  Laterality: Right;   POLYPECTOMY  2019   SSP x 1   TRANSANAL EXCISION OF RECTAL MASS N/A 06/05/2023   Procedure: TRANSANAL EXCISION ANAL LESION;  Surgeon: Romie Levee, MD;  Location: [facility];  Service: General;  Laterality: N/A;   WISDOM TOOTH EXTRACTION     Patient Active Problem List   Diagnosis Date Noted   Anal cancer (HCC) 05/16/2023   Fracture, [place], left, closed 01/03/2021   Body mass index (BMI) 19.9 or less, adult 04/07/2020   Age-related osteoporosis without current pathological fracture 07/10/2017   Seasonal allergies 07/10/2017   Hyperlipidemia 07/10/2017   Mild intermittent asthma 07/10/2017     PCP: Caesar Bookman, NP  REFERRING PROVIDER: Ronny Bacon, PA-C   REFERRING DIAG: C21.0 (ICD-10-CM) - Anal cancer (HCC)  THERAPY DIAG:  Muscle weakness (generalized)  Unspecified lack of coordination  Abnormal posture  Rationale for Evaluation and Treatment: Rehabilitation  ONSET DATE: 05/06/2023  SUBJECTIVE:  SUBJECTIVE STATEMENT: Pt diagnosed with anal cancer. She is having 28 radiation treatments that will start on 07/22/23. She was diagnosed 05/26/23. She had surgery to remove tumor 06/05/23; surgery was performed anally. She is going to have chemo during the first week and last week of radiation.  Fluid intake: a lot of water  PAIN:  Are you having pain? No   PRECAUTIONS: Other: anal cancer  RED FLAGS: None   WEIGHT BEARING RESTRICTIONS: No  FALLS:  Has patient fallen in last 6 months? No  OCCUPATION: retired  ACTIVITY LEVEL : walking, chair yoga  PLOF: Independent  PATIENT GOALS: to maintain vaginal health and intimate relationship with her partner  PERTINENT HISTORY:  Anal cancer/squamous cell carcinoma, asthma, osteoporosis  BOWEL MOVEMENT: Pain with bowel movement: No Type of bowel movement:Frequency 1x/Poarch in the morning, sometimes a second and Strain no Fully empty rectum: Yes:   Leakage: No Pads: No Fiber supplement/laxative colace 1x/Farha before bed (started after surgery)  URINATION: Pain with urination: No Fully empty bladder: Yes:   Stream: Strong Urgency: No Frequency: every couple hours  Leakage: Coughing - very occasional Pads: No  INTERCOURSE:  Ability to have vaginal penetration Yes  - not currently having often due to husband having prostate problems Pain with intercourse: none DrynessYes  Climax: NA Marinoff Scale:  0/3 Laxative:  PREGNANCY: Vaginal deliveries 1 Tearing Yes: unknown Episiotomy No C-section deliveries 0 Currently pregnant No  PROLAPSE: None   OBJECTIVE:  Note: Objective measures were completed at Evaluation unless otherwise noted.  07/02/23:  PATIENT SURVEYS:   PFIQ-7: 0  COGNITION: Overall cognitive status: Within functional limits for tasks assessed     SENSATION: Light touch: Appears intact    FUNCTIONAL TESTS:  Squat: WNL Single leg stance:  Rt: stable  Lt: stable   GAIT: Assistive device utilized: None Comments: WNL  POSTURE: rounded shoulders, forward head, increased thoracic kyphosis, and posterior pelvic tilt   LUMBARAROM/PROM:  A/PROM A/PROM  Eval (% available)  Flexion 100  Extension 50  Right lateral flexion 100  Left lateral flexion 100  Right rotation 100  Left rotation 100   (Blank rows = not tested)  PALPATION:                External Perineal Exam: vulvar atrophy, phimosis, labial fusion                             Internal Pelvic Floor: some vaginal stenosis starting in lateral vaginal canal bil  Patient confirms identification and approves PT to assess internal pelvic floor and treatment Yes  PELVIC MMT:   MMT eval  Vaginal 3/5, 5 second hold, 5 repeat contractions with good coordination  Internal Anal Sphincter 2/5  External Anal Sphincter 2/5  Puborectalis 3/5  (Blank rows = not tested)        TONE: low  PROLAPSE: Unable to tell due to inability to coordinate bearing down  TODAY'S TREATMENT:  DATE:  07/02/23  EVAL  Neuromuscular re-education: Pelvic floor muscle contraction training vaginally and rectally Pt provides verbal consent for internal vaginal/rectal pelvic floor exam. Therapeutic activities: HEP for mobility and gentle strengthening Walking program Vaginal moisturizer handout  (nothing during radiation) Vaginal lubricant handout (nothing during radiation) Education on dilators - do not start until 4-6 weeks after last radiation session Squatty potty and relaxed toilet mechanics Peri bottle for gentle cleansing and water wipes  Bowel massage    PATIENT EDUCATION:  Education details: See above Person educated: Patient Education method: Explanation, Demonstration, Tactile cues, Verbal cues, and Handouts Education comprehension: verbalized understanding  HOME EXERCISE PROGRAM: [id number]  ASSESSMENT:  CLINICAL IMPRESSION: Patient is a 73 y.o. female who was seen today for physical therapy evaluation and treatment for baseline evaluation before starting chemoradiation for anal cancer. Exam findings notable for abnormal posture, good functional core strength with pelvic stability in single leg stance, good pelvic floor muscle coordination and moderate strength, significant vulvar tissue atrophy, some lateral vaginal canal stenosis; overall pt is in good physical condition and already has good health habits. Initial treatment included pelvic floor muscle contraction training to optimize tissue mobility and circulation; we also discussed initial Hep to address mobility and gentle strengthening during treatment. We discussed regular walking program with at least 30 minutes daily at moderate intensity level. Pt education provided on use of bowel massage, squatty potty, relaxed toilet mechanics, gentle cleansing, vaginal moisturizers/lubricants when she is not in radiation, and initial dilator education. She may continue to benefit from skilled PT intervention after chemoradiation if she is seeing pelvic floor muscle dysfunction at that time; we went ahead and scheduled appointments to perform re-evaluation 4 weeks after last radiation session.   OBJECTIVE IMPAIRMENTS: decreased activity tolerance, decreased coordination, decreased endurance, decreased mobility, increased  fascial restrictions, increased muscle spasms, impaired tone, postural dysfunction, and pain.   ACTIVITY LIMITATIONS: continence  PARTICIPATION LIMITATIONS: interpersonal relationship and community activity  PERSONAL FACTORS: 3+ comorbidities: medical history  are also affecting patient's functional outcome.   REHAB POTENTIAL: Good  CLINICAL DECISION MAKING: Evolving/moderate complexity  EVALUATION COMPLEXITY: Moderate   GOALS: Goals reviewed with patient? Yes  SHORT TERM GOALS: Target date: 07/30/2023   Pt will be independent with HEP.   Baseline: Goal status: INITIAL  2.  Pt will be independent with use of squatty potty, relaxed toileting mechanics, and improved bowel movement techniques in order to increase ease of bowel movements and complete evacuation.   Baseline:  Goal status: INITIAL  3.  Pt will be independent with gentle perineal cleansing techniques to protect tissue and improve comfort.  Baseline:  Goal status: INITIAL  4.  Pt will be confident with starting vaginal dilators and getting baseline of comfort with vaginal penetration.  Baseline:  Goal status: INITIAL  5.  Pt will perform 150 minutes of moderate intensity walking each week.  Baseline:  Goal status: INITIAL  6.  Pt will perform stretching and strengthening program 2-3x/week. Baseline:  Goal status: INITIAL  LONG TERM GOALS: Target date: 09/24/23  Pt will be independent with advanced HEP.   Baseline:  Goal status: INITIAL  2.  Pt will report no fecal incontinence.  Baseline:  Goal status: INITIAL  3.  Pt will be independent with dilator progressions.  Baseline:  Goal status: INITIAL  4.  Pt will be comfortable with regular use of vaginal moisturizers and lubricants to improve vaginal and vulvar comfort.  Baseline:  Goal status: INITIAL   PLAN:  PT  FREQUENCY: 1-2x/week  PT DURATION: 12 weeks  PLANNED INTERVENTIONS: 97110-Therapeutic exercises, 97530- Therapeutic activity,  [id number]- Neuromuscular re-education, 97535- Self Care, [id number]- Manual therapy, Dry Needling, and Biofeedback  PLAN FOR NEXT SESSION: Re-evaluate after chemotherapy.    Julio Alm, PT, DPT04/01/255:11 PM

## 2023-07-03 ENCOUNTER — Ambulatory Visit: Admitting: Oncology

## 2023-07-08 ENCOUNTER — Other Ambulatory Visit: Payer: Self-pay | Admitting: Radiation Oncology

## 2023-07-08 ENCOUNTER — Encounter: Payer: Self-pay | Admitting: Oncology

## 2023-07-08 ENCOUNTER — Telehealth: Payer: Self-pay

## 2023-07-08 MED ORDER — ESTRADIOL 0.1 MG/GM VA CREA: TOPICAL_CREAM | VAGINAL | 3 refills | Status: DC

## 2023-07-08 NOTE — Telephone Encounter (Signed)
 RN called patient per Laurence Aly, PA-C to inform her that Physical therapy asked if we could try topical estrogen cream for her vulva twice daily for a week to help her fragile skin prior to starting radiation treatment.  It's possible she may be leaving to go out of country left voicemail to return my call if possible today.

## 2023-07-08 NOTE — Progress Notes (Signed)
 I left a vm for the patient with the instructions to try estrace cream externally in the vulvar area every night at bedtime for two weeks, then twice weekly there after. I encouraged her to reach out if she has questions or concerns with using this and we will check with her at her next visit to see how she is tolerating the cream.

## 2023-07-14 ENCOUNTER — Other Ambulatory Visit: Payer: Self-pay | Admitting: Oncology

## 2023-07-16 ENCOUNTER — Ambulatory Visit
Admission: RE | Admit: 2023-07-16 | Discharge: 2023-07-16 | Disposition: A | Discharge status: Home / Self Care | Source: Ambulatory Visit | Attending: Radiation Oncology | Admitting: Radiation Oncology

## 2023-07-16 DIAGNOSIS — C210 Malignant neoplasm of anus, unspecified: Secondary | ICD-10-CM | POA: Diagnosis not present

## 2023-07-16 DIAGNOSIS — Z510 Encounter for antineoplastic radiation therapy: Secondary | ICD-10-CM | POA: Insufficient documentation

## 2023-07-16 DIAGNOSIS — C218 Malignant neoplasm of overlapping sites of rectum, anus and anal canal: Secondary | ICD-10-CM | POA: Insufficient documentation

## 2023-07-16 DIAGNOSIS — Z5111 Encounter for antineoplastic chemotherapy: Secondary | ICD-10-CM | POA: Insufficient documentation

## 2023-07-16 DIAGNOSIS — C44520 Squamous cell carcinoma of anal skin: Secondary | ICD-10-CM | POA: Diagnosis not present

## 2023-07-18 ENCOUNTER — Other Ambulatory Visit: Payer: Self-pay | Admitting: *Deleted

## 2023-07-18 ENCOUNTER — Other Ambulatory Visit: Payer: Self-pay | Admitting: Oncology

## 2023-07-18 ENCOUNTER — Inpatient Hospital Stay

## 2023-07-18 ENCOUNTER — Inpatient Hospital Stay: Attending: Oncology

## 2023-07-18 DIAGNOSIS — Z5111 Encounter for antineoplastic chemotherapy: Secondary | ICD-10-CM | POA: Diagnosis not present

## 2023-07-18 DIAGNOSIS — C210 Malignant neoplasm of anus, unspecified: Secondary | ICD-10-CM

## 2023-07-18 DIAGNOSIS — Z510 Encounter for antineoplastic radiation therapy: Secondary | ICD-10-CM | POA: Diagnosis not present

## 2023-07-18 DIAGNOSIS — C218 Malignant neoplasm of overlapping sites of rectum, anus and anal canal: Secondary | ICD-10-CM | POA: Diagnosis not present

## 2023-07-18 LAB — CMP (CANCER CENTER ONLY)
ALT: 16 U/L (ref 0–44)
AST: 18 U/L (ref 15–41)
Albumin: 4.4 g/dL (ref 3.5–5.0)
Alkaline Phosphatase: 82 U/L (ref 38–126)
Anion gap: 7 (ref 5–15)
BUN: 20 mg/dL (ref 8–23)
CO2: 28 mmol/L (ref 22–32)
Calcium: 9.8 mg/dL (ref 8.9–10.3)
Chloride: 102 mmol/L (ref 98–111)
Creatinine: 0.75 mg/dL (ref 0.44–1.00)
GFR, Estimated: >60 mL/min (ref >60)
Glucose, Bld: 87 mg/dL (ref 70–99)
Potassium: 4.6 mmol/L (ref 3.5–5.1)
Sodium: 137 mmol/L (ref 135–145)
Total Bilirubin: 0.3 mg/dL (ref 0.0–1.2)
Total Protein: 7.2 g/dL (ref 6.5–8.1)

## 2023-07-18 LAB — CBC WITH DIFFERENTIAL (CANCER CENTER ONLY)
Abs Immature Granulocytes: 0.01 10*3/uL (ref 0.00–0.07)
Basophils Absolute: 0.1 10*3/uL (ref 0.0–0.1)
Basophils Relative: 1 %
Eosinophils Absolute: 0.4 10*3/uL (ref 0.0–0.5)
Eosinophils Relative: 5 %
HCT: 46.8 % — ABNORMAL HIGH (ref 36.0–46.0)
Hemoglobin: 15.5 g/dL — ABNORMAL HIGH (ref 12.0–15.0)
Immature Granulocytes: 0 %
Lymphocytes Relative: 32 %
Lymphs Abs: 2.2 10*3/uL (ref 0.7–4.0)
MCH: 30 pg (ref 26.0–34.0)
MCHC: 33.1 g/dL (ref 30.0–36.0)
MCV: 90.5 fL (ref 80.0–100.0)
Monocytes Absolute: 0.9 10*3/uL (ref 0.1–1.0)
Monocytes Relative: 13 %
Neutro Abs: 3.4 10*3/uL (ref 1.7–7.7)
Neutrophils Relative %: 49 %
Platelet Count: 286 10*3/uL (ref 150–400)
RBC: 5.17 MIL/uL — ABNORMAL HIGH (ref 3.87–5.11)
RDW: 12.4 % (ref 11.5–15.5)
WBC Count: 6.9 10*3/uL (ref 4.0–10.5)
nRBC: 0 % (ref 0.0–0.2)

## 2023-07-18 NOTE — Progress Notes (Signed)
 PATIENT NAVIGATOR PROGRESS NOTE  Name: Rebecca [name] Date: 07/18/2023 MRN: [id number]  DOB: October 03, 1950   Reason for visit:  Patient ed session  Comments:  See education note    Time spent counseling/coordinating care: > 60 minutes

## 2023-07-19 ENCOUNTER — Ambulatory Visit (HOSPITAL_COMMUNITY)
Admission: RE | Admit: 2023-07-19 | Discharge: 2023-07-19 | Disposition: A | Discharge status: Home / Self Care | Source: Ambulatory Visit | Attending: Oncology | Admitting: Oncology

## 2023-07-19 ENCOUNTER — Other Ambulatory Visit: Payer: Self-pay | Admitting: Oncology

## 2023-07-19 DIAGNOSIS — C218 Malignant neoplasm of overlapping sites of rectum, anus and anal canal: Secondary | ICD-10-CM | POA: Diagnosis not present

## 2023-07-19 DIAGNOSIS — C210 Malignant neoplasm of anus, unspecified: Secondary | ICD-10-CM

## 2023-07-19 DIAGNOSIS — Z510 Encounter for antineoplastic radiation therapy: Secondary | ICD-10-CM | POA: Diagnosis not present

## 2023-07-19 DIAGNOSIS — Z5111 Encounter for antineoplastic chemotherapy: Secondary | ICD-10-CM | POA: Diagnosis not present

## 2023-07-19 DIAGNOSIS — C44520 Squamous cell carcinoma of anal skin: Secondary | ICD-10-CM | POA: Diagnosis not present

## 2023-07-19 MED ORDER — HEPARIN SOD (PORK) LOCK FLUSH 100 UNIT/ML IV SOLN
500.0000 [IU] | Freq: Once | INTRAVENOUS | Status: AC
  Administered 2023-07-19: 500 [IU] via INTRAVENOUS

## 2023-07-19 MED ORDER — LIDOCAINE HCL 1 % IJ SOLN
INTRAMUSCULAR | Status: AC
  Filled 2023-07-19: qty 20

## 2023-07-19 MED ORDER — LIDOCAINE HCL 1 % IJ SOLN
20.0000 mL | Freq: Once | INTRAMUSCULAR | Status: AC
  Administered 2023-07-19: 1 mL via INTRADERMAL

## 2023-07-19 MED ORDER — HEPARIN SOD (PORK) LOCK FLUSH 100 UNIT/ML IV SOLN
INTRAVENOUS | Status: AC
  Filled 2023-07-19: qty 5

## 2023-07-19 NOTE — Procedures (Signed)
 Right DL basilic vein PICC placed. Length 39 cm. Tip SVC/RA junction. No immediate complications. Ok to use. Medication used- 1% lidocaine  to skin/SQ tissue. EBL<2 cc.

## 2023-07-22 ENCOUNTER — Inpatient Hospital Stay: Admitting: Oncology

## 2023-07-22 ENCOUNTER — Other Ambulatory Visit: Payer: Self-pay

## 2023-07-22 ENCOUNTER — Ambulatory Visit
Admission: RE | Admit: 2023-07-22 | Discharge: 2023-07-22 | Disposition: A | Discharge status: Home / Self Care | Source: Ambulatory Visit | Attending: Radiation Oncology | Admitting: Radiation Oncology

## 2023-07-22 ENCOUNTER — Inpatient Hospital Stay

## 2023-07-22 VITALS — BP 114/69 | HR 94 | Temp 98.2°F | Resp 18 | Ht 64.0 in | Wt 103.8 lb

## 2023-07-22 DIAGNOSIS — Z5111 Encounter for antineoplastic chemotherapy: Secondary | ICD-10-CM | POA: Diagnosis not present

## 2023-07-22 DIAGNOSIS — C210 Malignant neoplasm of anus, unspecified: Secondary | ICD-10-CM

## 2023-07-22 DIAGNOSIS — Z510 Encounter for antineoplastic radiation therapy: Secondary | ICD-10-CM | POA: Diagnosis not present

## 2023-07-22 DIAGNOSIS — C44520 Squamous cell carcinoma of anal skin: Secondary | ICD-10-CM | POA: Diagnosis not present

## 2023-07-22 DIAGNOSIS — C218 Malignant neoplasm of overlapping sites of rectum, anus and anal canal: Secondary | ICD-10-CM | POA: Diagnosis not present

## 2023-07-22 LAB — RAD ONC ARIA SESSION SUMMARY
Course Elapsed Days: 0
Plan Fractions Treated to Date: 1
Plan Prescribed Dose Per Fraction: 1.8 Gy
Plan Total Fractions Prescribed: 28
Plan Total Prescribed Dose: 50.4 Gy
Reference Point Dosage Given to Date: 1.8 Gy
Reference Point Session Dosage Given: 1.8 Gy
Session Number: 1

## 2023-07-22 MED ORDER — SODIUM CHLORIDE 0.9 % IV SOLN: INTRAVENOUS | Status: DC

## 2023-07-22 MED ORDER — SODIUM CHLORIDE 0.9 % IV SOLN
1000.0000 mg/m2/d | INTRAVENOUS | Status: DC
  Administered 2023-07-22: 5800 mg via INTRAVENOUS
  Filled 2023-07-22: qty 100

## 2023-07-22 MED ORDER — PROCHLORPERAZINE MALEATE 10 MG PO TABS
10.0000 mg | ORAL_TABLET | Freq: Once | ORAL | Status: AC
  Administered 2023-07-22: 10 mg via ORAL
  Filled 2023-07-22: qty 1

## 2023-07-22 MED ORDER — SODIUM CHLORIDE 0.9% FLUSH: 10.0000 mL | INTRAVENOUS | Status: DC | PRN

## 2023-07-22 MED ORDER — MITOMYCIN CHEMO IV INJECTION 20 MG
10.0000 mg/m2 | Freq: Once | INTRAVENOUS | Status: AC
Start: 2023-07-22 — End: 2023-07-22
  Administered 2023-07-22: 14.5 mg via INTRAVENOUS
  Filled 2023-07-22: qty 29

## 2023-07-22 MED ORDER — HEPARIN SOD (PORK) LOCK FLUSH 100 UNIT/ML IV SOLN: 500.0000 [IU] | Freq: Once | INTRAVENOUS | Status: DC | PRN

## 2023-07-22 NOTE — Progress Notes (Signed)
 Patient seen by Dr. Coni Deep today  Vitals are within treatment parameters:Yes   Labs are within treatment parameters: Yes OK to treat w/labs collected on 07/18/23  Treatment plan has been signed: Yes   Per physician team, Patient is ready for treatment and there are NO modifications to the treatment plan.

## 2023-07-22 NOTE — Patient Instructions (Signed)
 [facility] - A DEPT OF [facility]  Discharge Instructions: Thank you for choosing [facility] to provide your oncology and hematology care.   If you have a lab appointment with the Cancer Center, please go directly to the Cancer Center and check in at the registration area.   Wear comfortable clothing and clothing appropriate for easy access to any Portacath or PICC line.   We strive to give you quality time with your provider. You may need to reschedule your appointment if you arrive late (15 or more minutes).  Arriving late affects you and other patients whose appointments are after yours.  Also, if you miss three or more appointments without notifying the office, you may be dismissed from the clinic at the provider's discretion.      For prescription refill requests, have your pharmacy contact our office and allow 72 hours for refills to be completed.    Today you received the following chemotherapy and/or immunotherapy agents mitomycin , and fluorouracil .      To help prevent nausea and vomiting after your treatment, we encourage you to take your nausea medication as directed.  BELOW ARE SYMPTOMS THAT SHOULD BE REPORTED IMMEDIATELY: *FEVER GREATER THAN 100.4 F (38 C) OR HIGHER *CHILLS OR SWEATING *NAUSEA AND VOMITING THAT IS NOT CONTROLLED WITH YOUR NAUSEA MEDICATION *UNUSUAL SHORTNESS OF BREATH *UNUSUAL BRUISING OR BLEEDING *URINARY PROBLEMS (pain or burning when urinating, or frequent urination) *BOWEL PROBLEMS (unusual diarrhea, constipation, pain near the anus) TENDERNESS IN MOUTH AND THROAT WITH OR WITHOUT PRESENCE OF ULCERS (sore throat, sores in mouth, or a toothache) UNUSUAL RASH, SWELLING OR PAIN  UNUSUAL VAGINAL DISCHARGE OR ITCHING   Items with * indicate a potential emergency and should be followed up as soon as possible or go to the Emergency Department if any problems should occur.  Please show the CHEMOTHERAPY ALERT CARD  or IMMUNOTHERAPY ALERT CARD at check-in to the Emergency Department and triage nurse.  Should you have questions after your visit or need to cancel or reschedule your appointment, please contact [facility] - A DEPT OF [facility]  Dept: [phone]  and follow the prompts.  Office hours are 8:00 a.m. to 4:30 p.m. Monday - Friday. Please note that voicemails left after 4:00 p.m. may not be returned until the following business [place].  We are closed weekends and major holidays. You have access to a nurse at all times for urgent questions. Please call the main number to the clinic Dept: [phone] and follow the prompts.   For any non-urgent questions, you may also contact your provider using MyChart. We now offer e-Visits for anyone 63 and older to request care online for non-urgent symptoms. For details visit [email or web address].   Also download the MyChart app! Go to the app store, search "MyChart", open the app, select [place], and log in with your MyChart username and password.

## 2023-07-22 NOTE — Progress Notes (Signed)
 [facility] OFFICE PROGRESS NOTE   Diagnosis: Anal cancer  INTERVAL HISTORY:   Rebecca [name] returns as scheduled.  No difficulty with bowel function.  No complaint.  She returned last week from a trip to [place].  She underwent PICC placement 07/19/2023.  She attended a chemotherapy teaching class.  She began radiation today.  Objective:  Vital signs in last 24 hours:  Blood pressure 114/69, pulse 94, temperature 98.2 F (36.8 C), temperature source Temporal, resp. rate 18, height 5\' 4"  (1.626 m), weight 103 lb 12.8 oz (47.1 kg), SpO2 98%.   Resp: Lungs clear bilaterally Cardio: Regular rate and rhythm GI: No hepatosplenomegaly Vascular: No leg edema     Portacath/PICC-without erythema  Lab Results:  Lab Results  Component Value Date   WBC 6.9 07/18/2023   HGB 15.5 (H) 07/18/2023   HCT 46.8 (H) 07/18/2023   MCV 90.5 07/18/2023   PLT 286 07/18/2023   NEUTROABS 3.4 07/18/2023    CMP  Lab Results  Component Value Date   NA 137 07/18/2023   K 4.6 07/18/2023   CL 102 07/18/2023   CO2 28 07/18/2023   GLUCOSE 87 07/18/2023   BUN 20 07/18/2023   CREATININE 0.75 07/18/2023   CALCIUM  9.8 07/18/2023   PROT 7.2 07/18/2023   ALBUMIN 4.4 07/18/2023   AST 18 07/18/2023   ALT 16 07/18/2023   ALKPHOS 82 07/18/2023   BILITOT 0.3 07/18/2023   GFRNONAA >60 07/18/2023   GFRAA 92 12/04/2018    No results found for: "CEA1", "CEA", "CAN199", "CA125"  Lab Results  Component Value Date   INR 0.9 03/14/2020   LABPROT 12.1 03/14/2020    Imaging:  IR PICC PLACEMENT RIGHT >5 YRS INC IMG GUIDE Result Date: 07/19/2023 INDICATION: Patient with history of anal cancer; central venous access requested for chemotherapy EXAM: RIGHT UPPER EXTREMITY PICC LINE PLACEMENT WITH ULTRASOUND AND FLUOROSCOPIC GUIDANCE MEDICATIONS: 3 ml 1% lidocaine  to skin and subcutaneous tissue ANESTHESIA/SEDATION: Local anesthetic was administered. FLUOROSCOPY: Radiation Exposure Index (as provided  by the fluoroscopic device): 0 mGy Kerma COMPLICATIONS: None immediate. PROCEDURE: The patient was advised of the possible risks and complications and agreed to undergo the procedure. The patient was then brought to the angiographic suite for the procedure. The right arm was prepped with chlorhexidine , draped in the usual sterile fashion using maximum barrier technique (cap and mask, sterile gown, sterile gloves, large sterile sheet, hand hygiene and cutaneous antisepsis) and infiltrated locally with 1% Lidocaine . Ultrasound demonstrated patency of the right basilic vein, and this was documented with an image. Under real-time ultrasound guidance, this vein was accessed with a 21 gauge micropuncture needle and image documentation was performed. A 0.018 wire was introduced in to the vein. Over this, a 5 [place] dual lumen power injectable PICC was advanced to the lower SVC/right atrial junction. Fluoroscopy during the procedure and fluoro spot radiograph confirms appropriate catheter position. The catheter was flushed and covered with a sterile dressing. Catheter length: 39 cm IMPRESSION: Successful RIGHT arm power PICC line placement with ultrasound and fluoroscopic guidance. The tip of the catheter is positioned at the superior cavo-atrial junction. the catheter is ready for use. Performed by: Wash Hack Electronically Signed   By: Art Largo M.D.   On: 07/19/2023 10:11    Medications: I have reviewed the patient's current medications.   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  High-grade dysplasia is present at the anterior and posterior margins, distally Radiation 07/22/2023 Cycle one 5-FU/mitomycin  07/22/2023 Colon polyps on colonoscopy 05/06/2023: Sessile serrated polyps and tubular adenoma Colonoscopy 08/21/2017: Sessile serrated polyps at the ascending and hepatic flexure, biopsy of polypoid mucosa at the distal rectum near the dentate line-benign polypoid mucosa with a lymphoid aggregate 3.   Osteoporosis 4.   Asthma 5.  Vesicular lip/external nose lesions on exam 05/16/2023       Disposition: Rebecca [name] appears stable.  She began radiation earlier today.  She will complete cycle one 5-FU/Mitomycin -C beginning today.  She will return for the pump disconnect and removal of the PICC on 07/26/2023.  She will be scheduled for an office visit and nadir CBC on 07/31/2023.  Coni Deep, MD  07/22/2023  12:23 PM

## 2023-07-23 ENCOUNTER — Ambulatory Visit
Admission: RE | Admit: 2023-07-23 | Discharge: 2023-07-23 | Disposition: A | Discharge status: Home / Self Care | Source: Ambulatory Visit | Attending: Radiation Oncology | Admitting: Radiation Oncology

## 2023-07-23 ENCOUNTER — Other Ambulatory Visit: Payer: Self-pay

## 2023-07-23 DIAGNOSIS — C44520 Squamous cell carcinoma of anal skin: Secondary | ICD-10-CM | POA: Diagnosis not present

## 2023-07-23 DIAGNOSIS — Z510 Encounter for antineoplastic radiation therapy: Secondary | ICD-10-CM | POA: Diagnosis not present

## 2023-07-23 DIAGNOSIS — Z5111 Encounter for antineoplastic chemotherapy: Secondary | ICD-10-CM | POA: Diagnosis not present

## 2023-07-23 DIAGNOSIS — C218 Malignant neoplasm of overlapping sites of rectum, anus and anal canal: Secondary | ICD-10-CM | POA: Diagnosis not present

## 2023-07-23 DIAGNOSIS — C210 Malignant neoplasm of anus, unspecified: Secondary | ICD-10-CM | POA: Diagnosis not present

## 2023-07-23 LAB — RAD ONC ARIA SESSION SUMMARY
Course Elapsed Days: 1
Plan Fractions Treated to Date: 2
Plan Prescribed Dose Per Fraction: 1.8 Gy
Plan Total Fractions Prescribed: 28
Plan Total Prescribed Dose: 50.4 Gy
Reference Point Dosage Given to Date: 3.6 Gy
Reference Point Session Dosage Given: 1.8 Gy
Session Number: 2

## 2023-07-24 ENCOUNTER — Other Ambulatory Visit: Payer: Self-pay

## 2023-07-24 ENCOUNTER — Ambulatory Visit
Admission: RE | Admit: 2023-07-24 | Discharge: 2023-07-24 | Disposition: A | Discharge status: Home / Self Care | Source: Ambulatory Visit | Attending: Radiation Oncology

## 2023-07-24 DIAGNOSIS — C44520 Squamous cell carcinoma of anal skin: Secondary | ICD-10-CM | POA: Diagnosis not present

## 2023-07-24 DIAGNOSIS — Z5111 Encounter for antineoplastic chemotherapy: Secondary | ICD-10-CM | POA: Diagnosis not present

## 2023-07-24 DIAGNOSIS — C218 Malignant neoplasm of overlapping sites of rectum, anus and anal canal: Secondary | ICD-10-CM | POA: Diagnosis not present

## 2023-07-24 DIAGNOSIS — Z510 Encounter for antineoplastic radiation therapy: Secondary | ICD-10-CM | POA: Diagnosis not present

## 2023-07-24 DIAGNOSIS — C210 Malignant neoplasm of anus, unspecified: Secondary | ICD-10-CM | POA: Diagnosis not present

## 2023-07-24 LAB — RAD ONC ARIA SESSION SUMMARY
Course Elapsed Days: 2
Plan Fractions Treated to Date: 3
Plan Prescribed Dose Per Fraction: 1.8 Gy
Plan Total Fractions Prescribed: 28
Plan Total Prescribed Dose: 50.4 Gy
Reference Point Dosage Given to Date: 5.4 Gy
Reference Point Session Dosage Given: 1.8 Gy
Session Number: 3

## 2023-07-25 ENCOUNTER — Ambulatory Visit: Admitting: Oncology

## 2023-07-25 ENCOUNTER — Other Ambulatory Visit: Payer: Self-pay

## 2023-07-25 ENCOUNTER — Ambulatory Visit
Admission: RE | Admit: 2023-07-25 | Discharge: 2023-07-25 | Disposition: A | Discharge status: Home / Self Care | Source: Ambulatory Visit | Attending: Radiation Oncology | Admitting: Radiation Oncology

## 2023-07-25 DIAGNOSIS — C44520 Squamous cell carcinoma of anal skin: Secondary | ICD-10-CM | POA: Diagnosis not present

## 2023-07-25 DIAGNOSIS — C218 Malignant neoplasm of overlapping sites of rectum, anus and anal canal: Secondary | ICD-10-CM | POA: Diagnosis not present

## 2023-07-25 DIAGNOSIS — C210 Malignant neoplasm of anus, unspecified: Secondary | ICD-10-CM | POA: Diagnosis not present

## 2023-07-25 DIAGNOSIS — Z5111 Encounter for antineoplastic chemotherapy: Secondary | ICD-10-CM | POA: Diagnosis not present

## 2023-07-25 DIAGNOSIS — Z510 Encounter for antineoplastic radiation therapy: Secondary | ICD-10-CM | POA: Diagnosis not present

## 2023-07-25 LAB — RAD ONC ARIA SESSION SUMMARY
Course Elapsed Days: 3
Plan Fractions Treated to Date: 4
Plan Prescribed Dose Per Fraction: 1.8 Gy
Plan Total Fractions Prescribed: 28
Plan Total Prescribed Dose: 50.4 Gy
Reference Point Dosage Given to Date: 7.2 Gy
Reference Point Session Dosage Given: 1.8 Gy
Session Number: 4

## 2023-07-26 ENCOUNTER — Ambulatory Visit
Admission: RE | Admit: 2023-07-26 | Discharge: 2023-07-26 | Disposition: A | Discharge status: Home / Self Care | Source: Ambulatory Visit | Attending: Radiation Oncology | Admitting: Radiation Oncology

## 2023-07-26 ENCOUNTER — Other Ambulatory Visit: Payer: Self-pay

## 2023-07-26 ENCOUNTER — Inpatient Hospital Stay

## 2023-07-26 VITALS — BP 127/70 | HR 87 | Temp 98.2°F | Resp 16

## 2023-07-26 DIAGNOSIS — C218 Malignant neoplasm of overlapping sites of rectum, anus and anal canal: Secondary | ICD-10-CM | POA: Diagnosis not present

## 2023-07-26 DIAGNOSIS — C210 Malignant neoplasm of anus, unspecified: Secondary | ICD-10-CM

## 2023-07-26 DIAGNOSIS — Z510 Encounter for antineoplastic radiation therapy: Secondary | ICD-10-CM | POA: Diagnosis not present

## 2023-07-26 DIAGNOSIS — Z5111 Encounter for antineoplastic chemotherapy: Secondary | ICD-10-CM | POA: Diagnosis not present

## 2023-07-26 DIAGNOSIS — C44520 Squamous cell carcinoma of anal skin: Secondary | ICD-10-CM | POA: Diagnosis not present

## 2023-07-26 LAB — RAD ONC ARIA SESSION SUMMARY
Course Elapsed Days: 4
Plan Fractions Treated to Date: 5
Plan Prescribed Dose Per Fraction: 1.8 Gy
Plan Total Fractions Prescribed: 28
Plan Total Prescribed Dose: 50.4 Gy
Reference Point Dosage Given to Date: 9 Gy
Reference Point Session Dosage Given: 1.8 Gy
Session Number: 5

## 2023-07-26 MED ORDER — SODIUM CHLORIDE 0.9% FLUSH
3.0000 mL | INTRAVENOUS | Status: DC | PRN
  Administered 2023-07-26: 3 mL

## 2023-07-26 MED ORDER — HEPARIN SOD (PORK) LOCK FLUSH 100 UNIT/ML IV SOLN
250.0000 [IU] | Freq: Once | INTRAVENOUS | Status: AC | PRN
  Administered 2023-07-26: 250 [IU]

## 2023-07-26 NOTE — Progress Notes (Addendum)
 PICC Removal Note: S: Patient nervous, but calm and cooperative and agreeable to proceed with procedure. O: PICC line removed from right upper arm after sterile site prepped per protocol. PICC catheter tip visualized (39 cm) and intact. Pressure dressing applied with Vaseline gauze, 2x2 gauze, and Tegaderm. A: Old ecchymosis observed, but otherwise no redness, edema, swelling, or drainage noted at site. Patient remained supine for 30 minutes post removal. Vital signs taken and WNL P: Instructions provided on post PICC discharge care, including followup notification instructions.  Vitals:   07/26/23 1400 07/26/23 1513  BP: (!) 119/59 127/70  Pulse: 96 87  Resp: 16 16  Temp: 98.2 F (36.8 C)   SpO2: 98% 99%

## 2023-07-26 NOTE — Patient Instructions (Signed)
 PICC Removal, Adult, Care After The following information offers guidance on how to care for yourself after your procedure. Your health care provider may also give you more specific instructions. If you have problems or questions, contact your health care provider. What can I expect after the procedure? After the procedure, it is common to have: Tenderness or soreness. Redness, swelling, or a scab at the place where your PICC was removed (exit site). Follow these instructions at home: For the first 24 hours after the procedure: Keep the bandage (dressing) on your exit site clean and dry. Do not remove your dressing until your health care provider tells you to do so. Do not lift anything heavy or do activities that require great effort until your health care provider says it is okay. You should avoid: Lifting weights. Doing yard work. Doing any physical activity with repetitive arm movement. Watch closely for any signs of an air bubble in the vein (air embolism). This is a rare but serious complication. Signs of an air embolism include trouble breathing, wheezing, chest pain, or a fast pulse. If you have signs of an air embolism, call 911 right away and lie down on your left side to keep the air from moving into your lungs. After 24 hours have passed:  Remove your dressing as told by your health care provider. Wash your hands with soap and water for at least 20 seconds before and after you change your dressing. If soap and water are not available, use hand sanitizer. Return to your normal activities as told by your health care provider. A small scab may develop over the exit site. Do not pick at the scab. When bathing or showering, gently wash the exit site with soap and water. Pat it dry. Watch for signs of infection, such as: A fever or chills. Swollen glands under your arm. More redness, swelling, or soreness around your arm. Blood, fluid, or pus coming from your exit site. Warmth or a  bad smell coming from your exit site. A red streak spreading away from your exit site. General instructions Take over-the-counter and prescription medicines only as told by your health care provider. Do not take any new medicines without checking with your health care provider first. If you were given an antibiotic ointment, apply it as told by your health care provider. Keep all follow-up visits. This is important. Contact a health care provider if: You have a fever or chills. You have swelling at your exit site or swollen glands under your arm. You have signs of infection at your exit site. You have soreness, redness, or swelling in your arm that gets worse. Get help right away if: You have numbness or tingling in your fingers, hand, or arm. Your arm looks blue and feels cold. You have signs of an air embolism, such as trouble breathing, wheezing, chest pain, or a fast pulse. These symptoms may be an emergency. Get medical help right away. Call 911. Do not wait to see if the symptoms will go away. Do not drive yourself to the hospital. Summary After a PICC is removed, it is common to have tenderness or soreness, redness, swelling, or a scab at the exit site. Keep the bandage (dressing) over the exit site clean and dry. Do not remove the dressing until your health care provider tells you to do so. Do not lift anything heavy or do activities that require great effort until your health care provider says it is okay. Watch closely for any signs  of an air bubble (air embolism). If you have signs of an air embolism, call 911 right away and lie down on your left side. This information is not intended to replace advice given to you by your health care provider. Make sure you discuss any questions you have with your health care provider. Document Revised: 10/05/2020 Document Reviewed: 10/05/2020 Elsevier Patient Education  2024 [employer].

## 2023-07-29 ENCOUNTER — Ambulatory Visit

## 2023-07-29 ENCOUNTER — Other Ambulatory Visit

## 2023-07-29 ENCOUNTER — Other Ambulatory Visit: Payer: Self-pay

## 2023-07-29 ENCOUNTER — Encounter: Payer: Self-pay | Admitting: General Practice

## 2023-07-29 ENCOUNTER — Ambulatory Visit: Admitting: Oncology

## 2023-07-29 ENCOUNTER — Ambulatory Visit
Admission: RE | Admit: 2023-07-29 | Discharge: 2023-07-29 | Disposition: A | Discharge status: Home / Self Care | Source: Ambulatory Visit | Attending: Radiation Oncology

## 2023-07-29 DIAGNOSIS — Z5111 Encounter for antineoplastic chemotherapy: Secondary | ICD-10-CM | POA: Diagnosis not present

## 2023-07-29 DIAGNOSIS — C210 Malignant neoplasm of anus, unspecified: Secondary | ICD-10-CM | POA: Diagnosis not present

## 2023-07-29 DIAGNOSIS — C44520 Squamous cell carcinoma of anal skin: Secondary | ICD-10-CM | POA: Diagnosis not present

## 2023-07-29 DIAGNOSIS — C218 Malignant neoplasm of overlapping sites of rectum, anus and anal canal: Secondary | ICD-10-CM | POA: Diagnosis not present

## 2023-07-29 DIAGNOSIS — Z510 Encounter for antineoplastic radiation therapy: Secondary | ICD-10-CM | POA: Diagnosis not present

## 2023-07-29 LAB — RAD ONC ARIA SESSION SUMMARY
Course Elapsed Days: 7
Plan Fractions Treated to Date: 6
Plan Prescribed Dose Per Fraction: 1.8 Gy
Plan Total Fractions Prescribed: 28
Plan Total Prescribed Dose: 50.4 Gy
Reference Point Dosage Given to Date: 10.8 Gy
Reference Point Session Dosage Given: 1.8 Gy
Session Number: 6

## 2023-07-29 NOTE — Progress Notes (Signed)
 [facility] Spiritual Care Note  Visited with Rebecca [name] in Spiritual Care office, providing opportunity for her to process her recent pilgrimage to [place], [place] and how the themes of her trip connect with her cancer treatment and healing experiences. In particular, she has become aware of how important spiritual movement/dance is to how she is engaging and processing, which is integrating a longstanding tool/practice into her current experience. Radwa is also considering ways in which she might volunteer to share and connect with other people in their own cancer experiences.  Provided compassionate presence, reflective listening, and witness to her stories. She plans to reach out to schedule another appointment as needed/desired.   [address] Dorice Gardner, [place], [facility] Pager [phone] Voicemail [phone]

## 2023-07-30 ENCOUNTER — Telehealth: Payer: Self-pay | Admitting: *Deleted

## 2023-07-30 ENCOUNTER — Ambulatory Visit
Admission: RE | Admit: 2023-07-30 | Discharge: 2023-07-30 | Disposition: A | Discharge status: Home / Self Care | Source: Ambulatory Visit | Attending: Radiation Oncology | Admitting: Radiation Oncology

## 2023-07-30 ENCOUNTER — Other Ambulatory Visit: Payer: Self-pay

## 2023-07-30 DIAGNOSIS — C210 Malignant neoplasm of anus, unspecified: Secondary | ICD-10-CM | POA: Diagnosis not present

## 2023-07-30 DIAGNOSIS — C44520 Squamous cell carcinoma of anal skin: Secondary | ICD-10-CM | POA: Diagnosis not present

## 2023-07-30 DIAGNOSIS — Z5111 Encounter for antineoplastic chemotherapy: Secondary | ICD-10-CM | POA: Diagnosis not present

## 2023-07-30 DIAGNOSIS — C218 Malignant neoplasm of overlapping sites of rectum, anus and anal canal: Secondary | ICD-10-CM | POA: Diagnosis not present

## 2023-07-30 DIAGNOSIS — Z510 Encounter for antineoplastic radiation therapy: Secondary | ICD-10-CM | POA: Diagnosis not present

## 2023-07-30 LAB — RAD ONC ARIA SESSION SUMMARY
Course Elapsed Days: 8
Plan Fractions Treated to Date: 7
Plan Prescribed Dose Per Fraction: 1.8 Gy
Plan Total Fractions Prescribed: 28
Plan Total Prescribed Dose: 50.4 Gy
Reference Point Dosage Given to Date: 12.6 Gy
Reference Point Session Dosage Given: 1.8 Gy
Session Number: 7

## 2023-07-30 MED ORDER — MAGIC MOUTHWASH: 5.0000 mL | Freq: Four times a day (QID) | ORAL | 1 refills | Status: DC | PRN

## 2023-07-30 NOTE — Telephone Encounter (Signed)
 Reports mouth sores and roof of mouth burns. Instructed her rinse with baking soda, water and salt rinse several times daily as noted in her chemo education book and will call in MMW to swish for 1 minute, then spit QID. Avoid rough and acidic, carbonated beverages. OK to take ibuprofen or tylenol  as well. Will see her tomorrow as scheduled.

## 2023-07-31 ENCOUNTER — Other Ambulatory Visit: Payer: Self-pay

## 2023-07-31 ENCOUNTER — Inpatient Hospital Stay

## 2023-07-31 ENCOUNTER — Encounter: Payer: Self-pay | Admitting: Nurse Practitioner

## 2023-07-31 ENCOUNTER — Ambulatory Visit
Admission: RE | Admit: 2023-07-31 | Discharge: 2023-07-31 | Disposition: A | Discharge status: Home / Self Care | Source: Ambulatory Visit | Attending: Radiation Oncology

## 2023-07-31 ENCOUNTER — Ambulatory Visit: Admitting: Nutrition

## 2023-07-31 ENCOUNTER — Inpatient Hospital Stay: Admitting: Nurse Practitioner

## 2023-07-31 VITALS — BP 114/56 | HR 72 | Temp 98.1°F | Resp 18 | Ht 64.0 in | Wt 100.4 lb

## 2023-07-31 DIAGNOSIS — Z510 Encounter for antineoplastic radiation therapy: Secondary | ICD-10-CM | POA: Diagnosis not present

## 2023-07-31 DIAGNOSIS — C210 Malignant neoplasm of anus, unspecified: Secondary | ICD-10-CM | POA: Diagnosis not present

## 2023-07-31 DIAGNOSIS — C44520 Squamous cell carcinoma of anal skin: Secondary | ICD-10-CM | POA: Diagnosis not present

## 2023-07-31 DIAGNOSIS — C218 Malignant neoplasm of overlapping sites of rectum, anus and anal canal: Secondary | ICD-10-CM | POA: Diagnosis not present

## 2023-07-31 DIAGNOSIS — Z5111 Encounter for antineoplastic chemotherapy: Secondary | ICD-10-CM | POA: Diagnosis not present

## 2023-07-31 LAB — RAD ONC ARIA SESSION SUMMARY
Course Elapsed Days: 9
Plan Fractions Treated to Date: 8
Plan Prescribed Dose Per Fraction: 1.8 Gy
Plan Total Fractions Prescribed: 28
Plan Total Prescribed Dose: 50.4 Gy
Reference Point Dosage Given to Date: 14.4 Gy
Reference Point Session Dosage Given: 1.8 Gy
Session Number: 8

## 2023-07-31 LAB — CMP (CANCER CENTER ONLY)
ALT: 13 U/L (ref 0–44)
AST: 19 U/L (ref 15–41)
Albumin: 4.1 g/dL (ref 3.5–5.0)
Alkaline Phosphatase: 85 U/L (ref 38–126)
Anion gap: 9 (ref 5–15)
BUN: 14 mg/dL (ref 8–23)
CO2: 26 mmol/L (ref 22–32)
Calcium: 9.3 mg/dL (ref 8.9–10.3)
Chloride: 101 mmol/L (ref 98–111)
Creatinine: 0.7 mg/dL (ref 0.44–1.00)
GFR, Estimated: >60 mL/min (ref >60)
Glucose, Bld: 87 mg/dL (ref 70–99)
Potassium: 4.8 mmol/L (ref 3.5–5.1)
Sodium: 136 mmol/L (ref 135–145)
Total Bilirubin: 0.3 mg/dL (ref 0.0–1.2)
Total Protein: 6.4 g/dL — ABNORMAL LOW (ref 6.5–8.1)

## 2023-07-31 LAB — CBC WITH DIFFERENTIAL (CANCER CENTER ONLY)
Abs Immature Granulocytes: 0.01 10*3/uL (ref 0.00–0.07)
Basophils Absolute: 0 10*3/uL (ref 0.0–0.1)
Basophils Relative: 0 %
Eosinophils Absolute: 0.2 10*3/uL (ref 0.0–0.5)
Eosinophils Relative: 5 %
HCT: 42.8 % (ref 36.0–46.0)
Hemoglobin: 14.7 g/dL (ref 12.0–15.0)
Immature Granulocytes: 0 %
Lymphocytes Relative: 16 %
Lymphs Abs: 0.5 10*3/uL — ABNORMAL LOW (ref 0.7–4.0)
MCH: 30.5 pg (ref 26.0–34.0)
MCHC: 34.3 g/dL (ref 30.0–36.0)
MCV: 88.8 fL (ref 80.0–100.0)
Monocytes Absolute: 0.2 10*3/uL (ref 0.1–1.0)
Monocytes Relative: 5 %
Neutro Abs: 2.3 10*3/uL (ref 1.7–7.7)
Neutrophils Relative %: 74 %
Platelet Count: 192 10*3/uL (ref 150–400)
RBC: 4.82 MIL/uL (ref 3.87–5.11)
RDW: 11.7 % (ref 11.5–15.5)
WBC Count: 3.1 10*3/uL — ABNORMAL LOW (ref 4.0–10.5)
nRBC: 0 % (ref 0.0–0.2)

## 2023-07-31 NOTE — Progress Notes (Signed)
 Nutrition follow up added at provider request.  Weight: 100 pounds 6.4 oz March 5 103 pounds 12.8 oz April 21 100 pounds 6.4 oz April 30  Labs reviewed.  Medications include MMW, Compazine  and Colace.  Patient has developed mouth sores limiting ability to increase calories and protein and consume foods she normally eats. She has tried ONS but cannot find the Ensure Plus or Ensure Complete in stores she shops.  Nutrition Diagnosis: Food and Nutrition Related Knowledge Deficit continues  Intervention: Continue baking soda and salt water gargles. MMW per MD. Increase oral intake to 6 times daily. Add calories and protein to food preferences. Consider cream soups, soft proteins like cottage cheese, yogurt, pimento cheese, scramble eggs, etc. Puree foods to soften textures. Drink Ensure Complete or equivalent 2 times daily. Add ice cream and fruit as desired. Recipes provided. Samples given. Contact information given.  Monitoring, Evaluation, Goals: Tolerate increased calories and protein in small frequent meals and snacks.  Next Visit: Encouraged patient to contact RD for follow up.

## 2023-07-31 NOTE — Progress Notes (Signed)
 [facility] OFFICE PROGRESS NOTE   Diagnosis: Anal cancer  INTERVAL HISTORY:   Rebecca [name] returns as scheduled.  She begin radiation 07/22/2023.  She completed cycle one 5-FU/mitomycin  beginning 07/22/2023.  She had a few episodes of a "gag feeling".  She took Compazine  with good relief.  She notes that her mouth is sore and she has a sore throat.  She has taken ibuprofen with temporary improvement.  She is doing baking soda rinses and Magic mouthwash.  She notes significant discomfort when she brushes her teeth.  She is tolerating fluids.  She is drinking an Ensure each Reich[place].  No diarrhea.  Last few days stools have been hard.  Objective:  Vital signs in last 24 hours:  Blood pressure (!) 114/56, pulse 72, temperature 98.1 F (36.7 C), temperature source Temporal, resp. rate 18, height 5\' 4"  (1.626 m), weight 100 lb 6.4 oz (45.5 kg), SpO2 98%.    HEENT: Posterior palate is erythematous and ulcerated. Resp: Lungs clear bilaterally. Cardio: Regular rate and rhythm. GI: No hepatosplenomegaly. Vascular: No leg edema. Skin: Palms without erythema.  Perineum/perianal skin is mildly erythematous/hyperpigmented.  No skin breakdown.  Bilateral groin regions and labia mildly erythematous.   Lab Results:  Lab Results  Component Value Date   WBC 3.1 (L) 07/31/2023   HGB 14.7 07/31/2023   HCT 42.8 07/31/2023   MCV 88.8 07/31/2023   PLT 192 07/31/2023   NEUTROABS 2.3 07/31/2023    Imaging:  No results found.  Medications: I have reviewed the patient's current medications.  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  High-grade dysplasia is present at the anterior and posterior margins, distally Radiation 07/22/2023 Cycle one 5-FU/mitomycin  07/22/2023 Colon polyps on colonoscopy 05/06/2023: Sessile serrated polyps and tubular adenoma Colonoscopy 08/21/2017: Sessile serrated polyps at the ascending and hepatic flexure, biopsy of polypoid mucosa at the distal rectum near the dentate line-benign polypoid mucosa with a lymphoid aggregate 3.   Osteoporosis 4.   Asthma 5.  Vesicular lip/external nose lesions on exam 05/16/2023 6.  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  She continues radiation.  She completed cycle one 5-FU/mitomycin  beginning 07/22/2023.  She has significant oral mucositis.  She will continue mouth rinses, Magic mouthwash.  She will obtain Biotene toothpaste and mouth rinse.  We discussed a soft diet.  She does not appear dehydrated.  She is meeting with the Cancer Center dietitian today to discuss strategies to maintain adequate hydration and increase caloric intake.  Chemotherapy will be dose reduced with cycle 2.  CBC and chemistry panel reviewed.  She has mild leukopenia, otherwise no significant lab findings.  She is scheduled for PICC line placement 08/16/2023.  We will see her in follow-up prior to cycle two 5-FU/mitomycin  on 08/19/2023.  We are available to see her sooner if needed.  Patient seen with Dr. Scherrie Curt.    [name] ANP/GNP-BC   07/31/2023  11:33  AM  This was a shared visit with [name].  Rebecca [name] was interviewed and examined.  She is now at [facility] following cycle one  5-FU/Mitomycin -C and concurrent radiation.  She has oral mucositis.  She will use topical therapies.  She will request analgesics as needed.  The 5-FU and Mitomycin -C will be dose reduced with cycle 2.  I was present for greater than 50% of today's visit.  I performed Medical Decision Making.  Anise Kerns, MD

## 2023-08-01 ENCOUNTER — Encounter: Payer: Self-pay | Admitting: Oncology

## 2023-08-01 ENCOUNTER — Other Ambulatory Visit: Payer: Self-pay

## 2023-08-01 ENCOUNTER — Ambulatory Visit
Admission: RE | Admit: 2023-08-01 | Discharge: 2023-08-01 | Disposition: A | Discharge status: Home / Self Care | Source: Ambulatory Visit | Attending: Radiation Oncology | Admitting: Radiation Oncology

## 2023-08-01 DIAGNOSIS — Z510 Encounter for antineoplastic radiation therapy: Secondary | ICD-10-CM | POA: Diagnosis not present

## 2023-08-01 DIAGNOSIS — C210 Malignant neoplasm of anus, unspecified: Secondary | ICD-10-CM | POA: Diagnosis not present

## 2023-08-01 DIAGNOSIS — C44520 Squamous cell carcinoma of anal skin: Secondary | ICD-10-CM | POA: Diagnosis not present

## 2023-08-01 LAB — RAD ONC ARIA SESSION SUMMARY
Course Elapsed Days: 10
Plan Fractions Treated to Date: 9
Plan Prescribed Dose Per Fraction: 1.8 Gy
Plan Total Fractions Prescribed: 28
Plan Total Prescribed Dose: 50.4 Gy
Reference Point Dosage Given to Date: 16.2 Gy
Reference Point Session Dosage Given: 1.8 Gy
Session Number: 9

## 2023-08-02 ENCOUNTER — Other Ambulatory Visit: Payer: Self-pay

## 2023-08-02 ENCOUNTER — Ambulatory Visit
Admission: RE | Admit: 2023-08-02 | Discharge: 2023-08-02 | Disposition: A | Discharge status: Home / Self Care | Source: Ambulatory Visit | Attending: Radiation Oncology | Admitting: Radiation Oncology

## 2023-08-02 ENCOUNTER — Ambulatory Visit
Admission: RE | Admit: 2023-08-02 | Discharge: 2023-08-02 | Disposition: A | Discharge status: Home / Self Care | Source: Ambulatory Visit | Attending: Radiation Oncology

## 2023-08-02 ENCOUNTER — Encounter

## 2023-08-02 DIAGNOSIS — Z510 Encounter for antineoplastic radiation therapy: Secondary | ICD-10-CM | POA: Diagnosis not present

## 2023-08-02 DIAGNOSIS — C210 Malignant neoplasm of anus, unspecified: Secondary | ICD-10-CM | POA: Diagnosis not present

## 2023-08-02 DIAGNOSIS — C44520 Squamous cell carcinoma of anal skin: Secondary | ICD-10-CM | POA: Diagnosis not present

## 2023-08-02 LAB — RAD ONC ARIA SESSION SUMMARY
Course Elapsed Days: 11
Plan Fractions Treated to Date: 10
Plan Prescribed Dose Per Fraction: 1.8 Gy
Plan Total Fractions Prescribed: 28
Plan Total Prescribed Dose: 50.4 Gy
Reference Point Dosage Given to Date: 18 Gy
Reference Point Session Dosage Given: 1.8 Gy
Session Number: 10

## 2023-08-05 ENCOUNTER — Other Ambulatory Visit: Payer: Self-pay

## 2023-08-05 ENCOUNTER — Ambulatory Visit
Admission: RE | Admit: 2023-08-05 | Discharge: 2023-08-05 | Disposition: A | Discharge status: Home / Self Care | Source: Ambulatory Visit | Attending: Radiation Oncology | Admitting: Radiation Oncology

## 2023-08-05 DIAGNOSIS — C210 Malignant neoplasm of anus, unspecified: Secondary | ICD-10-CM | POA: Diagnosis not present

## 2023-08-05 DIAGNOSIS — Z510 Encounter for antineoplastic radiation therapy: Secondary | ICD-10-CM | POA: Diagnosis not present

## 2023-08-05 DIAGNOSIS — C44520 Squamous cell carcinoma of anal skin: Secondary | ICD-10-CM | POA: Diagnosis not present

## 2023-08-05 LAB — RAD ONC ARIA SESSION SUMMARY
Course Elapsed Days: 14
Plan Fractions Treated to Date: 11
Plan Prescribed Dose Per Fraction: 1.8 Gy
Plan Total Fractions Prescribed: 28
Plan Total Prescribed Dose: 50.4 Gy
Reference Point Dosage Given to Date: 19.8 Gy
Reference Point Session Dosage Given: 1.8 Gy
Session Number: 11

## 2023-08-06 ENCOUNTER — Other Ambulatory Visit: Payer: Self-pay

## 2023-08-06 ENCOUNTER — Ambulatory Visit
Admission: RE | Admit: 2023-08-06 | Discharge: 2023-08-06 | Disposition: A | Discharge status: Home / Self Care | Source: Ambulatory Visit | Attending: Radiation Oncology | Admitting: Radiation Oncology

## 2023-08-06 DIAGNOSIS — C44520 Squamous cell carcinoma of anal skin: Secondary | ICD-10-CM | POA: Diagnosis not present

## 2023-08-06 DIAGNOSIS — Z510 Encounter for antineoplastic radiation therapy: Secondary | ICD-10-CM | POA: Diagnosis not present

## 2023-08-06 DIAGNOSIS — C210 Malignant neoplasm of anus, unspecified: Secondary | ICD-10-CM | POA: Diagnosis not present

## 2023-08-06 LAB — RAD ONC ARIA SESSION SUMMARY
Course Elapsed Days: 15
Plan Fractions Treated to Date: 12
Plan Prescribed Dose Per Fraction: 1.8 Gy
Plan Total Fractions Prescribed: 28
Plan Total Prescribed Dose: 50.4 Gy
Reference Point Dosage Given to Date: 21.6 Gy
Reference Point Session Dosage Given: 1.8 Gy
Session Number: 12

## 2023-08-07 ENCOUNTER — Other Ambulatory Visit: Payer: Self-pay

## 2023-08-07 ENCOUNTER — Ambulatory Visit
Admission: RE | Admit: 2023-08-07 | Discharge: 2023-08-07 | Disposition: A | Discharge status: Home / Self Care | Source: Ambulatory Visit | Attending: Radiation Oncology

## 2023-08-07 DIAGNOSIS — Z510 Encounter for antineoplastic radiation therapy: Secondary | ICD-10-CM | POA: Diagnosis not present

## 2023-08-07 DIAGNOSIS — C210 Malignant neoplasm of anus, unspecified: Secondary | ICD-10-CM | POA: Diagnosis not present

## 2023-08-07 DIAGNOSIS — C44520 Squamous cell carcinoma of anal skin: Secondary | ICD-10-CM | POA: Diagnosis not present

## 2023-08-07 LAB — RAD ONC ARIA SESSION SUMMARY
Course Elapsed Days: 16
Plan Fractions Treated to Date: 13
Plan Prescribed Dose Per Fraction: 1.8 Gy
Plan Total Fractions Prescribed: 28
Plan Total Prescribed Dose: 50.4 Gy
Reference Point Dosage Given to Date: 23.4 Gy
Reference Point Session Dosage Given: 1.8 Gy
Session Number: 13

## 2023-08-08 ENCOUNTER — Ambulatory Visit
Admission: RE | Admit: 2023-08-08 | Discharge: 2023-08-08 | Disposition: A | Discharge status: Home / Self Care | Source: Ambulatory Visit | Attending: Radiation Oncology | Admitting: Radiation Oncology

## 2023-08-08 ENCOUNTER — Other Ambulatory Visit: Payer: Self-pay

## 2023-08-08 DIAGNOSIS — C44520 Squamous cell carcinoma of anal skin: Secondary | ICD-10-CM | POA: Diagnosis not present

## 2023-08-08 DIAGNOSIS — C210 Malignant neoplasm of anus, unspecified: Secondary | ICD-10-CM | POA: Diagnosis not present

## 2023-08-08 DIAGNOSIS — Z510 Encounter for antineoplastic radiation therapy: Secondary | ICD-10-CM | POA: Diagnosis not present

## 2023-08-08 LAB — RAD ONC ARIA SESSION SUMMARY
Course Elapsed Days: 17
Plan Fractions Treated to Date: 14
Plan Prescribed Dose Per Fraction: 1.8 Gy
Plan Total Fractions Prescribed: 28
Plan Total Prescribed Dose: 50.4 Gy
Reference Point Dosage Given to Date: 25.2 Gy
Reference Point Session Dosage Given: 1.8 Gy
Session Number: 14

## 2023-08-09 ENCOUNTER — Ambulatory Visit
Admission: RE | Admit: 2023-08-09 | Discharge: 2023-08-09 | Disposition: A | Discharge status: Home / Self Care | Source: Ambulatory Visit | Attending: Radiation Oncology

## 2023-08-09 ENCOUNTER — Ambulatory Visit
Admission: RE | Admit: 2023-08-09 | Discharge: 2023-08-09 | Disposition: A | Discharge status: Home / Self Care | Source: Ambulatory Visit | Attending: Radiation Oncology | Admitting: Radiation Oncology

## 2023-08-09 ENCOUNTER — Other Ambulatory Visit: Payer: Self-pay

## 2023-08-09 DIAGNOSIS — Z510 Encounter for antineoplastic radiation therapy: Secondary | ICD-10-CM | POA: Diagnosis not present

## 2023-08-09 DIAGNOSIS — C44520 Squamous cell carcinoma of anal skin: Secondary | ICD-10-CM | POA: Diagnosis not present

## 2023-08-09 DIAGNOSIS — C210 Malignant neoplasm of anus, unspecified: Secondary | ICD-10-CM | POA: Diagnosis not present

## 2023-08-09 LAB — RAD ONC ARIA SESSION SUMMARY
Course Elapsed Days: 18
Plan Fractions Treated to Date: 15
Plan Prescribed Dose Per Fraction: 1.8 Gy
Plan Total Fractions Prescribed: 28
Plan Total Prescribed Dose: 50.4 Gy
Reference Point Dosage Given to Date: 27 Gy
Reference Point Session Dosage Given: 1.8 Gy
Session Number: 15

## 2023-08-12 ENCOUNTER — Ambulatory Visit
Admission: RE | Admit: 2023-08-12 | Discharge: 2023-08-12 | Disposition: A | Discharge status: Home / Self Care | Source: Ambulatory Visit | Attending: Radiation Oncology | Admitting: Radiation Oncology

## 2023-08-12 ENCOUNTER — Telehealth: Payer: Self-pay | Admitting: *Deleted

## 2023-08-12 ENCOUNTER — Other Ambulatory Visit: Payer: Self-pay

## 2023-08-12 ENCOUNTER — Encounter: Payer: Self-pay | Admitting: Oncology

## 2023-08-12 ENCOUNTER — Encounter: Payer: Self-pay | Admitting: Radiation Oncology

## 2023-08-12 DIAGNOSIS — C44520 Squamous cell carcinoma of anal skin: Secondary | ICD-10-CM | POA: Diagnosis not present

## 2023-08-12 DIAGNOSIS — C210 Malignant neoplasm of anus, unspecified: Secondary | ICD-10-CM | POA: Diagnosis not present

## 2023-08-12 DIAGNOSIS — Z510 Encounter for antineoplastic radiation therapy: Secondary | ICD-10-CM | POA: Diagnosis not present

## 2023-08-12 LAB — RAD ONC ARIA SESSION SUMMARY
Course Elapsed Days: 21
Plan Fractions Treated to Date: 16
Plan Prescribed Dose Per Fraction: 1.8 Gy
Plan Total Fractions Prescribed: 28
Plan Total Prescribed Dose: 50.4 Gy
Reference Point Dosage Given to Date: 28.8 Gy
Reference Point Session Dosage Given: 1.8 Gy
Session Number: 16

## 2023-08-12 NOTE — Progress Notes (Signed)
 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  She has started having some trouble with her hair falling out related to her chemo, and plans to discuss this further with Dr. Scherrie Curt.  She discusses that her skin seems to be intact, and she was able to use Estrace  cream for approximately 1 week prior to starting radiation.  Though she did not notice a significant difference after using it.  We discussed that once she is finished with radiation provided that her skin is intact she should resume and restart the loading dose phase of that medication once weekly at bedtime for 1 week followed by twice weekly at bedtime and that this could continue through her PCP or OB/GYN.  Inspection of the female genitalia reveals normal-appearing external female genitalia without any visible lesions of the labia, but hyperpigmentation is noted there as well as in the groin folds.  The anus itself could not be inspected based on being in a dressing room however the patient does not feel like she has had any blistered changes of the skin.  She will continue therapy and will see Dr. Jeryl Moris on Friday.

## 2023-08-12 NOTE — Telephone Encounter (Addendum)
 Ms. Feijoo left VM that she is losing hair and wishes to discuss w/nurse. Attempted to return call-left VM to call back or leave Mychart message with her concerns. Called Lanecia back and she reports her hair is coming out in clumps. Asking if this will continue? Informed her that complete hair loss with this regimen is not common, but everyone is different. The hair loss usually start 10-14 days after drug exposure, but she will regrow her hair after her chemotherapy is completed. Encourage he to be as gentle to her hair as possible and wear a turban at night.

## 2023-08-13 ENCOUNTER — Ambulatory Visit
Admission: RE | Admit: 2023-08-13 | Discharge: 2023-08-13 | Disposition: A | Discharge status: Home / Self Care | Source: Ambulatory Visit | Attending: Radiation Oncology

## 2023-08-13 ENCOUNTER — Other Ambulatory Visit: Payer: Self-pay | Admitting: *Deleted

## 2023-08-13 ENCOUNTER — Other Ambulatory Visit: Payer: Self-pay

## 2023-08-13 DIAGNOSIS — C44520 Squamous cell carcinoma of anal skin: Secondary | ICD-10-CM | POA: Diagnosis not present

## 2023-08-13 DIAGNOSIS — Z510 Encounter for antineoplastic radiation therapy: Secondary | ICD-10-CM | POA: Diagnosis not present

## 2023-08-13 DIAGNOSIS — C210 Malignant neoplasm of anus, unspecified: Secondary | ICD-10-CM | POA: Diagnosis not present

## 2023-08-13 LAB — RAD ONC ARIA SESSION SUMMARY
Course Elapsed Days: 22
Plan Fractions Treated to Date: 17
Plan Prescribed Dose Per Fraction: 1.8 Gy
Plan Total Fractions Prescribed: 28
Plan Total Prescribed Dose: 50.4 Gy
Reference Point Dosage Given to Date: 30.6 Gy
Reference Point Session Dosage Given: 1.8 Gy
Session Number: 17

## 2023-08-14 ENCOUNTER — Ambulatory Visit
Admission: RE | Admit: 2023-08-14 | Discharge: 2023-08-14 | Disposition: A | Discharge status: Home / Self Care | Source: Ambulatory Visit | Attending: Radiation Oncology

## 2023-08-14 ENCOUNTER — Other Ambulatory Visit: Payer: Self-pay

## 2023-08-14 DIAGNOSIS — C210 Malignant neoplasm of anus, unspecified: Secondary | ICD-10-CM | POA: Diagnosis not present

## 2023-08-14 DIAGNOSIS — C44520 Squamous cell carcinoma of anal skin: Secondary | ICD-10-CM | POA: Diagnosis not present

## 2023-08-14 DIAGNOSIS — Z510 Encounter for antineoplastic radiation therapy: Secondary | ICD-10-CM | POA: Diagnosis not present

## 2023-08-14 LAB — RAD ONC ARIA SESSION SUMMARY
Course Elapsed Days: 23
Plan Fractions Treated to Date: 18
Plan Prescribed Dose Per Fraction: 1.8 Gy
Plan Total Fractions Prescribed: 28
Plan Total Prescribed Dose: 50.4 Gy
Reference Point Dosage Given to Date: 32.4 Gy
Reference Point Session Dosage Given: 1.8 Gy
Session Number: 18

## 2023-08-15 ENCOUNTER — Ambulatory Visit
Admission: RE | Admit: 2023-08-15 | Discharge: 2023-08-15 | Disposition: A | Discharge status: Home / Self Care | Source: Ambulatory Visit | Attending: Radiation Oncology | Admitting: Radiation Oncology

## 2023-08-15 ENCOUNTER — Other Ambulatory Visit: Payer: Self-pay

## 2023-08-15 DIAGNOSIS — C210 Malignant neoplasm of anus, unspecified: Secondary | ICD-10-CM | POA: Diagnosis not present

## 2023-08-15 DIAGNOSIS — Z510 Encounter for antineoplastic radiation therapy: Secondary | ICD-10-CM | POA: Diagnosis not present

## 2023-08-15 DIAGNOSIS — C44520 Squamous cell carcinoma of anal skin: Secondary | ICD-10-CM | POA: Diagnosis not present

## 2023-08-15 LAB — RAD ONC ARIA SESSION SUMMARY
Course Elapsed Days: 24
Plan Fractions Treated to Date: 19
Plan Prescribed Dose Per Fraction: 1.8 Gy
Plan Total Fractions Prescribed: 28
Plan Total Prescribed Dose: 50.4 Gy
Reference Point Dosage Given to Date: 34.2 Gy
Reference Point Session Dosage Given: 1.8 Gy
Session Number: 19

## 2023-08-16 ENCOUNTER — Other Ambulatory Visit: Payer: Self-pay

## 2023-08-16 ENCOUNTER — Ambulatory Visit
Admission: RE | Admit: 2023-08-16 | Discharge: 2023-08-16 | Disposition: A | Discharge status: Home / Self Care | Source: Ambulatory Visit | Attending: Radiation Oncology | Admitting: Radiation Oncology

## 2023-08-16 ENCOUNTER — Telehealth: Payer: Self-pay | Admitting: Radiation Oncology

## 2023-08-16 ENCOUNTER — Inpatient Hospital Stay (HOSPITAL_COMMUNITY): Admission: RE | Admit: 2023-08-16 | Source: Ambulatory Visit

## 2023-08-16 DIAGNOSIS — C44520 Squamous cell carcinoma of anal skin: Secondary | ICD-10-CM | POA: Diagnosis not present

## 2023-08-16 DIAGNOSIS — C210 Malignant neoplasm of anus, unspecified: Secondary | ICD-10-CM | POA: Diagnosis not present

## 2023-08-16 DIAGNOSIS — Z510 Encounter for antineoplastic radiation therapy: Secondary | ICD-10-CM | POA: Diagnosis not present

## 2023-08-16 LAB — RAD ONC ARIA SESSION SUMMARY
Course Elapsed Days: 25
Plan Fractions Treated to Date: 20
Plan Prescribed Dose Per Fraction: 1.8 Gy
Plan Total Fractions Prescribed: 28
Plan Total Prescribed Dose: 50.4 Gy
Reference Point Dosage Given to Date: 36 Gy
Reference Point Session Dosage Given: 1.8 Gy
Session Number: 20

## 2023-08-16 NOTE — Telephone Encounter (Signed)
 5/16 Received a call from [facility] department) concerning patient.  She is scheduled to have her PICC line placed on Monday, 5/19 at 8:00 am, she wants someone to be aware, patient may be late for her treatments.  Email sent to L2 machine and copied Support RTT, Latoya, RN, so they are aware.

## 2023-08-18 ENCOUNTER — Other Ambulatory Visit: Payer: Self-pay | Admitting: Oncology

## 2023-08-19 ENCOUNTER — Other Ambulatory Visit: Payer: Self-pay

## 2023-08-19 ENCOUNTER — Inpatient Hospital Stay: Admitting: Nurse Practitioner

## 2023-08-19 ENCOUNTER — Other Ambulatory Visit: Payer: Self-pay | Admitting: Oncology

## 2023-08-19 ENCOUNTER — Encounter: Payer: Self-pay | Admitting: Nurse Practitioner

## 2023-08-19 ENCOUNTER — Inpatient Hospital Stay

## 2023-08-19 ENCOUNTER — Ambulatory Visit (HOSPITAL_COMMUNITY)
Admission: RE | Admit: 2023-08-19 | Discharge: 2023-08-19 | Disposition: A | Discharge status: Home / Self Care | Source: Ambulatory Visit | Attending: Oncology | Admitting: Oncology

## 2023-08-19 ENCOUNTER — Ambulatory Visit
Admission: RE | Admit: 2023-08-19 | Discharge: 2023-08-19 | Disposition: A | Discharge status: Home / Self Care | Source: Ambulatory Visit | Attending: Radiation Oncology | Admitting: Radiation Oncology

## 2023-08-19 VITALS — BP 100/62 | HR 100 | Temp 98.2°F | Resp 18 | Ht 64.0 in | Wt 98.1 lb

## 2023-08-19 DIAGNOSIS — C210 Malignant neoplasm of anus, unspecified: Secondary | ICD-10-CM | POA: Insufficient documentation

## 2023-08-19 DIAGNOSIS — Z510 Encounter for antineoplastic radiation therapy: Secondary | ICD-10-CM | POA: Diagnosis not present

## 2023-08-19 DIAGNOSIS — Z452 Encounter for adjustment and management of vascular access device: Secondary | ICD-10-CM

## 2023-08-19 DIAGNOSIS — Z923 Personal history of irradiation: Secondary | ICD-10-CM | POA: Insufficient documentation

## 2023-08-19 DIAGNOSIS — Z5111 Encounter for antineoplastic chemotherapy: Secondary | ICD-10-CM | POA: Insufficient documentation

## 2023-08-19 DIAGNOSIS — K1231 Oral mucositis (ulcerative) due to antineoplastic therapy: Secondary | ICD-10-CM | POA: Insufficient documentation

## 2023-08-19 DIAGNOSIS — C218 Malignant neoplasm of overlapping sites of rectum, anus and anal canal: Secondary | ICD-10-CM | POA: Insufficient documentation

## 2023-08-19 DIAGNOSIS — C44520 Squamous cell carcinoma of anal skin: Secondary | ICD-10-CM | POA: Diagnosis not present

## 2023-08-19 LAB — CMP (CANCER CENTER ONLY)
ALT: 11 U/L (ref 0–44)
AST: 17 U/L (ref 15–41)
Albumin: 3.3 g/dL — ABNORMAL LOW (ref 3.5–5.0)
Alkaline Phosphatase: 60 U/L (ref 38–126)
Anion gap: 11 (ref 5–15)
BUN: 9 mg/dL (ref 8–23)
CO2: 24 mmol/L (ref 22–32)
Calcium: 8.5 mg/dL — ABNORMAL LOW (ref 8.9–10.3)
Chloride: 96 mmol/L — ABNORMAL LOW (ref 98–111)
Creatinine: 0.61 mg/dL (ref 0.44–1.00)
GFR, Estimated: >60 mL/min (ref >60)
Glucose, Bld: 122 mg/dL — ABNORMAL HIGH (ref 70–99)
Potassium: 3.8 mmol/L (ref 3.5–5.1)
Sodium: 131 mmol/L — ABNORMAL LOW (ref 135–145)
Total Bilirubin: 0.2 mg/dL (ref 0.0–1.2)
Total Protein: 5.5 g/dL — ABNORMAL LOW (ref 6.5–8.1)

## 2023-08-19 LAB — RAD ONC ARIA SESSION SUMMARY
Course Elapsed Days: 28
Plan Fractions Treated to Date: 21
Plan Prescribed Dose Per Fraction: 1.8 Gy
Plan Total Fractions Prescribed: 28
Plan Total Prescribed Dose: 50.4 Gy
Reference Point Dosage Given to Date: 37.8 Gy
Reference Point Session Dosage Given: 1.8 Gy
Session Number: 21

## 2023-08-19 LAB — CBC WITH DIFFERENTIAL (CANCER CENTER ONLY)
Abs Immature Granulocytes: 0.03 10*3/uL (ref 0.00–0.07)
Basophils Absolute: 0 10*3/uL (ref 0.0–0.1)
Basophils Relative: 1 %
Eosinophils Absolute: 0.2 10*3/uL (ref 0.0–0.5)
Eosinophils Relative: 5 %
HCT: 40.1 % (ref 36.0–46.0)
Hemoglobin: 13.8 g/dL (ref 12.0–15.0)
Immature Granulocytes: 1 %
Lymphocytes Relative: 20 %
Lymphs Abs: 0.8 10*3/uL (ref 0.7–4.0)
MCH: 29.9 pg (ref 26.0–34.0)
MCHC: 34.4 g/dL (ref 30.0–36.0)
MCV: 87 fL (ref 80.0–100.0)
Monocytes Absolute: 0.9 10*3/uL (ref 0.1–1.0)
Monocytes Relative: 25 %
Neutro Abs: 1.9 10*3/uL (ref 1.7–7.7)
Neutrophils Relative %: 48 %
Platelet Count: 346 10*3/uL (ref 150–400)
RBC: 4.61 MIL/uL (ref 3.87–5.11)
RDW: 13.3 % (ref 11.5–15.5)
WBC Count: 3.8 10*3/uL — ABNORMAL LOW (ref 4.0–10.5)
nRBC: 0 % (ref 0.0–0.2)

## 2023-08-19 MED ORDER — PROCHLORPERAZINE MALEATE 10 MG PO TABS
10.0000 mg | ORAL_TABLET | Freq: Once | ORAL | Status: AC
  Administered 2023-08-19: 10 mg via ORAL
  Filled 2023-08-19: qty 1

## 2023-08-19 MED ORDER — MITOMYCIN CHEMO IV INJECTION 20 MG
7.5000 mg/m2 | Freq: Once | INTRAVENOUS | Status: AC
  Administered 2023-08-19: 11 mg via INTRAVENOUS
  Filled 2023-08-19: qty 22

## 2023-08-19 MED ORDER — LIDOCAINE HCL 1 % IJ SOLN
INTRAMUSCULAR | Status: AC
  Filled 2023-08-19: qty 20

## 2023-08-19 MED ORDER — HEPARIN SOD (PORK) LOCK FLUSH 100 UNIT/ML IV SOLN
250.0000 [IU] | Freq: Once | INTRAVENOUS | Status: AC | PRN
  Administered 2023-08-19: 250 [IU]

## 2023-08-19 MED ORDER — LIDOCAINE HCL 1 % IJ SOLN
20.0000 mL | Freq: Once | INTRAMUSCULAR | Status: AC
  Administered 2023-08-19: 5 mL via INTRADERMAL

## 2023-08-19 MED ORDER — HEPARIN SOD (PORK) LOCK FLUSH 100 UNIT/ML IV SOLN
INTRAVENOUS | Status: AC
  Filled 2023-08-19: qty 5

## 2023-08-19 MED ORDER — SODIUM CHLORIDE 0.9% FLUSH
3.0000 mL | INTRAVENOUS | Status: DC | PRN
  Administered 2023-08-19: 3 mL

## 2023-08-19 MED ORDER — SODIUM CHLORIDE 0.9 % IV SOLN
600.0000 mg/m2/d | INTRAVENOUS | Status: DC
  Administered 2023-08-19: 3500 mg via INTRAVENOUS
  Filled 2023-08-19: qty 70

## 2023-08-19 MED ORDER — SODIUM CHLORIDE 0.9 % IV SOLN: INTRAVENOUS | Status: DC

## 2023-08-19 NOTE — Progress Notes (Signed)
 [facility] OFFICE PROGRESS NOTE   Diagnosis: Anal cancer  INTERVAL HISTORY:   Rebecca [name] returns as scheduled.  She continues radiation.  She completed cycle one 5-FU/mitomycin  beginning 07/22/2023.  She developed mucositis, now resolved.  She has intermittent loose stools, controlled with Imodium.  She had an episode of nausea/vomiting yesterday.  She notes hair loss.  Objective:  Vital signs in last 24 hours:  Blood pressure 100/62, pulse 100, temperature 98.2 F (36.8 C), temperature source Temporal, resp. rate 18, height 5\' 4"  (1.626 m), weight 98 lb 1.6 oz (44.5 kg), SpO2 98%.    HEENT: No thrush or ulcers. Resp: Lungs clear bilaterally. Cardio: Regular rate and rhythm. GI: No hepatosplenomegaly. Vascular: No leg edema. Skin: Erythema bilateral groin regions.  Perineum/perianal skin is erythematous with superficial ulceration. PICC site without erythema.  Lab Results:  Lab Results  Component Value Date   WBC 3.8 (L) 08/19/2023   HGB 13.8 08/19/2023   HCT 40.1 08/19/2023   MCV 87.0 08/19/2023   PLT 346 08/19/2023   NEUTROABS PENDING 08/19/2023    Imaging:  IR PICC PLACEMENT LEFT >5 YRS INC IMG GUIDE Result Date: 08/19/2023 INDICATION: chemo Patient with history of anal cancer; central venous access requested for chemotherapy. EXAM: LEFT UPPER EXTREMITY PICC LINE PLACEMENT WITH ULTRASOUND AND FLUOROSCOPIC GUIDANCE MEDICATIONS: 3 mL 1% lidocaine  to skin/subcutaneous tissue ANESTHESIA/SEDATION: Local anesthetic was administered. FLUOROSCOPY: Radiation Exposure Index and estimated peak skin dose (PSD); Reference air kerma (RAK), 0.1 mGy. COMPLICATIONS: None immediate. PROCEDURE: The patient was advised of the possible risks and complications and agreed to undergo the procedure. The patient was then brought to the angiographic suite for the procedure. The LEFT arm was prepped with chlorhexidine , draped in the usual sterile fashion using maximum barrier technique  (cap and mask, sterile gown, sterile gloves, large sterile sheet, hand hygiene and cutaneous antisepsis) and infiltrated locally with 1% Lidocaine . Ultrasound demonstrated patency of the LEFT basilic vein, and this was documented with an image. Under real-time ultrasound guidance, this vein was accessed with a 21 gauge micropuncture needle and image documentation was performed. A 0.018 wire was introduced in to the vein. Over this, a 5 Fr dual lumen power injectable PICC was advanced to the lower SVC/right atrial junction. Fluoroscopy during the procedure and fluoro spot radiograph confirms appropriate catheter position. The catheter was flushed and covered with a sterile dressing. Catheter length: 41 cm IMPRESSION: Successful LEFT arm power PICC line placement with ultrasound and fluoroscopic guidance. The tip of the catheter is positioned at the superior cavo-atrial junction. the catheter is ready for use., Performed by: Wash Hack Electronically Signed   By: Art Largo M.D.   On: 08/19/2023 11:15    Medications: I have reviewed the patient's current medications.  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  High-grade dysplasia is present at the anterior and posterior margins,  distally Radiation 07/22/2023 Cycle one 5-FU/mitomycin  07/22/2023 Cycle two 5-FU/mitomycin  08/19/2023, dose reduced  due to mucositis following cycle 1 Colon polyps on colonoscopy 05/06/2023: Sessile serrated polyps and tubular adenoma Colonoscopy 08/21/2017: Sessile serrated polyps at the ascending and hepatic flexure, biopsy of polypoid mucosa at the distal rectum near the dentate line-benign polypoid mucosa with a lymphoid aggregate 3.   Osteoporosis 4.   Asthma 5.  Vesicular lip/external nose lesions on exam 05/16/2023 6.  Oral mucositis secondary to chemotherapy 07/31/2023  Disposition: Ms. Corprew appears stable.  She continues radiation.  She has completed 1 cycle of 5-FU/mitomycin .  She developed oral mucositis after cycle 1, now resolved.  Plan to proceed with cycle 2 today as scheduled.  Chemotherapy has been dose reduced.    CBC and chemistry panel reviewed.  Labs adequate to proceed with treatment as above.  Plan for repeat CBC in 2 weeks.  There is skin breakdown at the perineum/perianal region.  We have messaged radiation oncology regarding topical supportive care.  We will let her know the recommendation.  She will return for follow-up in 3-4 weeks.  [name] ANP/GNP-BC   08/19/2023  11:43 AM

## 2023-08-19 NOTE — Patient Instructions (Signed)
 PICC Home Care Guide A peripherally inserted central catheter (PICC) is a form of IV access that allows medicines and IV fluids to be quickly put into the blood and spread throughout the body. The PICC is a long, thin, flexible tube (catheter) that is put into a vein in a person's arm or leg. The catheter ends in a large vein just outside the heart called the superior vena cava (SVC). After the PICC is put in, a chest X-ray may be done to make sure that it is in the right place. A PICC may be placed for different reasons, such as: To give medicines and liquid nutrition. To give IV fluids and blood products. To take blood samples often. If there is trouble placing a peripheral intravenous (PIV) catheter. If cared for properly, a PICC can remain in place for many months. Having a PICC can allow you to go home from the hospital sooner and continue treatment at home. Medicines and PICC care can be managed at home by a family member, caregiver, or home health care team. What are the risks? Generally, having a PICC is safe. However, problems may occur, including: A blood clot (thrombus) forming in or at the end of the PICC. A blood clot forming in a vein (deep vein thrombosis) or traveling to the lung (pulmonary embolism). Inflammation of the vein (phlebitis) in which the PICC is placed. Infection at the insertion site or in the blood. Blood infections from central lines, like PICCs, can be serious and often require a hospital stay. PICC malposition, or PICC movement or poor placement. A break or cut in the PICC. Do not use scissors near the PICC. Nerve or tendon irritation or injury during PICC insertion. How to care for your PICC Please follow the specific guidelines provided by your health care provider. Preventing infection You and any caregivers should wash your hands often with soap and water for at least 20 seconds. Wash hands: Before touching the PICC or the infusion device. Before changing a  bandage (dressing). Do not change the dressing unless you have been taught to do so and have shown you are able to change it safely. Flush the PICC as told. Tell your health care provider right away if the PICC is hard to flush or does not flush. Do not use force to flush the PICC. Use clean and germ-free (sterile) supplies only. Keep the supplies in a dry place. Do not reuse needles, syringes, or any other supplies. Reusing supplies can lead to infection. Keep the PICC dressing dry and secure it with tape if the edges stop sticking to your skin. Check your PICC insertion site every day for signs of infection. Check for: Redness, swelling, or pain. Fluid or blood. Warmth. Pus or a bad smell. Preventing other problems Do not use a syringe that is less than 10 mL to flush the PICC. Do not have your blood pressure checked on the arm in which the PICC is placed. Do not ever pull or tug on the PICC. Keep it secured to your arm with tape or a stretch wrap when not in use. Do not take the PICC out yourself. Only a trained health care provider should remove the PICC. Keep pets and children away from your PICC. How to care for your PICC dressing Keep your PICC dressing clean and dry to prevent infection. Do not take baths, swim, or use a hot tub until your health care provider approves. Ask your health care provider if you can take  showers. You may only be allowed to take sponge baths. When you are allowed to shower: Ask your health care provider to teach you how to wrap the PICC. Cover the PICC with clear plastic wrap and tape to keep it dry while showering. Follow instructions from your health care provider about how to take care of your insertion site and dressing. Make sure you: Wash your hands with soap and water for at least 20 seconds before and after you change your dressing. If soap and water are not available, use hand sanitizer. Change your dressing only if taught to do so by your health care  provider. Your PICC dressing needs to be changed if it becomes loose or wet. Leave stitches (sutures), skin glue, or adhesive strips in place. These skin closures may need to stay in place for 2 weeks or longer. If adhesive strip edges start to loosen and curl up, you may trim the loose edges. Do not remove adhesive strips completely unless your health care provider tells you to do that. Follow these instructions at home: Disposal of supplies Throw away any syringes in a disposal container that is meant for sharp items (sharps container). You can buy a sharps container from a pharmacy, or you can make one by using an empty, hard plastic bottle with a lid. Place any used dressings or infusion bags into a plastic bag. Throw that bag in the trash. General instructions  Always carry your PICC identification card or wear a medical alert bracelet. Keep the tube clamped at all times, unless it is being used. Always carry a smooth-edge clamp with you to clamp the PICC if it breaks. Do not use scissors or sharp objects near the tube. You may bend your arm and move it freely. If your PICC is near or at the bend of your elbow, avoid activity with repeated motion at the elbow. Avoid lifting heavy objects as told by your health care provider. Keep all follow-up visits. This is important. You will need to have your PICC dressing changed at least once a week. Contact a health care provider if: You have pain in your arm, ear, face, or teeth. You have a fever or chills. You have redness, swelling, or pain around the insertion site. You have fluid or blood coming from the insertion site. Your insertion site feels warm to the touch. You have pus or a bad smell coming from the insertion site. Your skin feels hard and raised around the insertion site. Your PICC dressing has gotten wet or is coming off and you have not been taught how to change it. Get help right away if: You have problems with your PICC, such as  your PICC: Was tugged or pulled and has partially come out. Do not  push the PICC back in. Cannot be flushed, is hard to flush, or leaks around the insertion site when it is flushed. Makes a flushing sound when it is flushed. Appears to have a hole or tear. Is accidentally pulled all the way out. If this happens, cover the insertion site with a gauze dressing. Do not throw the PICC away. Your health care provider will need to check it to be sure the entire catheter came out. You feel your heart racing or skipping beats, or you have chest pain. You have shortness of breath or trouble breathing. You have swelling, redness, warmth, or pain in the arm in which the PICC is placed. You have a red streak going up your arm that  starts under the PICC dressing. These symptoms may be an emergency. Get help right away. Call 911. Do not wait to see if the symptoms will go away. Do not drive yourself to the hospital. Summary A peripherally inserted central catheter (PICC) is a long, thin, flexible tube (catheter) that is put into a vein in the arm or leg. If cared for properly, a PICC can remain in place for many months. Having a PICC can allow you to go home from the hospital sooner and continue treatment at home. The PICC is inserted using a germ-free (sterile) technique by a specially trained health care provider. Only a trained health care provider should remove it. Do not have your blood pressure checked on the arm in which your PICC is placed. Always keep your PICC identification card with you. This information is not intended to replace advice given to you by your health care provider. Make sure you discuss any questions you have with your health care provider. Document Revised: 10/05/2020 Document Reviewed: 10/05/2020 Elsevier Patient Education  2024 [employer].

## 2023-08-19 NOTE — Procedures (Signed)
 Left double-lumen basilic vein PICC placed.  Length 41 cm.  Tip SVC/right atrial junction.  Medication used-1% lidocaine  to skin and subcutaneous tissue.  No immediate complications.  Okay to use.  EBL less than 2 cc.

## 2023-08-19 NOTE — Progress Notes (Signed)
 Patient seen by Lonna Cobb NP today  Vitals are within treatment parameters:Yes   Labs are within treatment parameters: Yes   Treatment plan has been signed: Yes   Per physician team, Patient is ready for treatment and there are NO modifications to the treatment plan.

## 2023-08-19 NOTE — Patient Instructions (Signed)
 [facility] - A DEPT OF [facility]  Discharge Instructions: Thank you for choosing [facility] to provide your oncology and hematology care.   If you have a lab appointment with the Cancer Center, please go directly to the Cancer Center and check in at the registration area.   Wear comfortable clothing and clothing appropriate for easy access to any Portacath or PICC line.   We strive to give you quality time with your provider. You may need to reschedule your appointment if you arrive late (15 or more minutes).  Arriving late affects you and other patients whose appointments are after yours.  Also, if you miss three or more appointments without notifying the office, you may be dismissed from the clinic at the provider's discretion.      For prescription refill requests, have your pharmacy contact our office and allow 72 hours for refills to be completed.    Today you received the following chemotherapy and/or immunotherapy agents mitomycin , and fluorouracil .      To help prevent nausea and vomiting after your treatment, we encourage you to take your nausea medication as directed.  BELOW ARE SYMPTOMS THAT SHOULD BE REPORTED IMMEDIATELY: *FEVER GREATER THAN 100.4 F (38 C) OR HIGHER *CHILLS OR SWEATING *NAUSEA AND VOMITING THAT IS NOT CONTROLLED WITH YOUR NAUSEA MEDICATION *UNUSUAL SHORTNESS OF BREATH *UNUSUAL BRUISING OR BLEEDING *URINARY PROBLEMS (pain or burning when urinating, or frequent urination) *BOWEL PROBLEMS (unusual diarrhea, constipation, pain near the anus) TENDERNESS IN MOUTH AND THROAT WITH OR WITHOUT PRESENCE OF ULCERS (sore throat, sores in mouth, or a toothache) UNUSUAL RASH, SWELLING OR PAIN  UNUSUAL VAGINAL DISCHARGE OR ITCHING   Items with * indicate a potential emergency and should be followed up as soon as possible or go to the Emergency Department if any problems should occur.  Please show the CHEMOTHERAPY ALERT CARD  or IMMUNOTHERAPY ALERT CARD at check-in to the Emergency Department and triage nurse.  Should you have questions after your visit or need to cancel or reschedule your appointment, please contact [facility] - A DEPT OF [facility]  Dept: [phone]  and follow the prompts.  Office hours are 8:00 a.m. to 4:30 p.m. Monday - Friday. Please note that voicemails left after 4:00 p.m. may not be returned until the following business [place].  We are closed weekends and major holidays. You have access to a nurse at all times for urgent questions. Please call the main number to the clinic Dept: [phone] and follow the prompts.   For any non-urgent questions, you may also contact your provider using MyChart. We now offer e-Visits for anyone 63 and older to request care online for non-urgent symptoms. For details visit [email or web address].   Also download the MyChart app! Go to the app store, search "MyChart", open the app, select [place], and log in with your MyChart username and password.

## 2023-08-20 ENCOUNTER — Ambulatory Visit
Admission: RE | Admit: 2023-08-20 | Discharge: 2023-08-20 | Disposition: A | Discharge status: Home / Self Care | Source: Ambulatory Visit | Attending: Radiation Oncology | Admitting: Radiation Oncology

## 2023-08-20 ENCOUNTER — Other Ambulatory Visit: Payer: Self-pay

## 2023-08-20 DIAGNOSIS — Z510 Encounter for antineoplastic radiation therapy: Secondary | ICD-10-CM | POA: Diagnosis not present

## 2023-08-20 DIAGNOSIS — C210 Malignant neoplasm of anus, unspecified: Secondary | ICD-10-CM | POA: Diagnosis not present

## 2023-08-20 DIAGNOSIS — C44520 Squamous cell carcinoma of anal skin: Secondary | ICD-10-CM | POA: Diagnosis not present

## 2023-08-20 LAB — RAD ONC ARIA SESSION SUMMARY
Course Elapsed Days: 29
Plan Fractions Treated to Date: 22
Plan Prescribed Dose Per Fraction: 1.8 Gy
Plan Total Fractions Prescribed: 28
Plan Total Prescribed Dose: 50.4 Gy
Reference Point Dosage Given to Date: 39.6 Gy
Reference Point Session Dosage Given: 1.8 Gy
Session Number: 22

## 2023-08-21 ENCOUNTER — Ambulatory Visit
Admission: RE | Admit: 2023-08-21 | Discharge: 2023-08-21 | Disposition: A | Discharge status: Home / Self Care | Source: Ambulatory Visit | Attending: Radiation Oncology | Admitting: Radiation Oncology

## 2023-08-21 ENCOUNTER — Encounter: Payer: Self-pay | Admitting: Radiation Oncology

## 2023-08-21 ENCOUNTER — Other Ambulatory Visit: Payer: Self-pay | Admitting: Radiation Oncology

## 2023-08-21 ENCOUNTER — Other Ambulatory Visit: Payer: Self-pay

## 2023-08-21 ENCOUNTER — Encounter

## 2023-08-21 DIAGNOSIS — C44520 Squamous cell carcinoma of anal skin: Secondary | ICD-10-CM | POA: Diagnosis not present

## 2023-08-21 DIAGNOSIS — C210 Malignant neoplasm of anus, unspecified: Secondary | ICD-10-CM | POA: Diagnosis not present

## 2023-08-21 DIAGNOSIS — Z510 Encounter for antineoplastic radiation therapy: Secondary | ICD-10-CM | POA: Diagnosis not present

## 2023-08-21 LAB — RAD ONC ARIA SESSION SUMMARY
Course Elapsed Days: 30
Plan Fractions Treated to Date: 23
Plan Prescribed Dose Per Fraction: 1.8 Gy
Plan Total Fractions Prescribed: 28
Plan Total Prescribed Dose: 50.4 Gy
Reference Point Dosage Given to Date: 41.4 Gy
Reference Point Session Dosage Given: 1.8 Gy
Session Number: 23

## 2023-08-21 MED ORDER — ONDANSETRON 4 MG PO TBDP
ORAL_TABLET | ORAL | Status: AC
  Filled 2023-08-21: qty 2

## 2023-08-21 MED ORDER — ONDANSETRON HCL 8 MG PO TABS: 8.0000 mg | ORAL_TABLET | Freq: Three times a day (TID) | ORAL | 0 refills | Status: DC | PRN

## 2023-08-21 MED ORDER — ONDANSETRON 4 MG PO TBDP
8.0000 mg | ORAL_TABLET | Freq: Once | ORAL | Status: AC
  Administered 2023-08-21: 8 mg via ORAL

## 2023-08-21 NOTE — Progress Notes (Signed)
 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 She has been having lose stool and has taken a tablet of imodium to try. She has also noticed nausea and an episode of emesis over the weekend, and only has compazine  to try at home.   On exam she has hyperpigmentation of the groin, mons pubis, vulva and perineal/perianal tissues. There is patchy moist desquamation of the perianal tissue without drainage or concerns for infectious findings.    We discussed the continued plan to proceed with radiotherapy. We also discussed considering tucks wipes or using topical witch hazel with soft gauze/towels. We also discussed Zofran  and a new prescription was given to the patient and a tablet offered to the patient today in clinic. We also discussed consideration of narcotic pain medication but she declines at this time. If her symptoms are persistant by Friday, we could consider silvadene topically to use over the weekend, and not on Monday afternoon before resuming her remaining treatments on Tuesday with the memorial [place] holiday.      Shelvia Dick, PAC

## 2023-08-22 ENCOUNTER — Other Ambulatory Visit: Payer: Self-pay

## 2023-08-22 ENCOUNTER — Ambulatory Visit
Admission: RE | Admit: 2023-08-22 | Discharge: 2023-08-22 | Disposition: A | Discharge status: Home / Self Care | Source: Ambulatory Visit | Attending: Radiation Oncology | Admitting: Radiation Oncology

## 2023-08-22 DIAGNOSIS — C210 Malignant neoplasm of anus, unspecified: Secondary | ICD-10-CM | POA: Diagnosis not present

## 2023-08-22 DIAGNOSIS — C44520 Squamous cell carcinoma of anal skin: Secondary | ICD-10-CM | POA: Diagnosis not present

## 2023-08-22 DIAGNOSIS — Z510 Encounter for antineoplastic radiation therapy: Secondary | ICD-10-CM | POA: Diagnosis not present

## 2023-08-22 LAB — RAD ONC ARIA SESSION SUMMARY
Course Elapsed Days: 31
Plan Fractions Treated to Date: 24
Plan Prescribed Dose Per Fraction: 1.8 Gy
Plan Total Fractions Prescribed: 28
Plan Total Prescribed Dose: 50.4 Gy
Reference Point Dosage Given to Date: 43.2 Gy
Reference Point Session Dosage Given: 1.8 Gy
Session Number: 24

## 2023-08-23 ENCOUNTER — Inpatient Hospital Stay

## 2023-08-23 ENCOUNTER — Other Ambulatory Visit: Payer: Self-pay | Admitting: *Deleted

## 2023-08-23 ENCOUNTER — Telehealth: Payer: Self-pay | Admitting: *Deleted

## 2023-08-23 ENCOUNTER — Ambulatory Visit
Admission: RE | Admit: 2023-08-23 | Discharge: 2023-08-23 | Disposition: A | Discharge status: Home / Self Care | Source: Ambulatory Visit | Attending: Radiation Oncology | Admitting: Radiation Oncology

## 2023-08-23 ENCOUNTER — Other Ambulatory Visit: Payer: Self-pay

## 2023-08-23 VITALS — BP 116/65 | HR 92 | Temp 98.6°F | Resp 18

## 2023-08-23 DIAGNOSIS — C44520 Squamous cell carcinoma of anal skin: Secondary | ICD-10-CM | POA: Diagnosis not present

## 2023-08-23 DIAGNOSIS — Z452 Encounter for adjustment and management of vascular access device: Secondary | ICD-10-CM

## 2023-08-23 DIAGNOSIS — C210 Malignant neoplasm of anus, unspecified: Secondary | ICD-10-CM

## 2023-08-23 DIAGNOSIS — C211 Malignant neoplasm of anal canal: Secondary | ICD-10-CM

## 2023-08-23 DIAGNOSIS — Z510 Encounter for antineoplastic radiation therapy: Secondary | ICD-10-CM | POA: Diagnosis not present

## 2023-08-23 LAB — RAD ONC ARIA SESSION SUMMARY
Course Elapsed Days: 32
Plan Fractions Treated to Date: 25
Plan Prescribed Dose Per Fraction: 1.8 Gy
Plan Total Fractions Prescribed: 28
Plan Total Prescribed Dose: 50.4 Gy
Reference Point Dosage Given to Date: 45 Gy
Reference Point Session Dosage Given: 1.8 Gy
Session Number: 25

## 2023-08-23 MED ORDER — SODIUM CHLORIDE 0.9% FLUSH
10.0000 mL | INTRAVENOUS | Status: DC | PRN
Start: 2023-08-23 — End: 2023-08-23

## 2023-08-23 NOTE — Telephone Encounter (Signed)
 Patient left VM at 1201 that her pump has stopped. Asking if she can come in before her 2 pm appointment to have it discontinued. Attempted to return call and left VM OK to come early. Tell registration what happened.

## 2023-08-23 NOTE — Progress Notes (Signed)
 PICC Removal Note: S: Patient completed treatment. Orders to removed O: PICC line removed from left antecubital after sterile site prepped per protocol. PICC catheter tip visualized and intact 41 cm at 1445. Pressure dressing applied with micropore tape. A: No redness, ecchymosis, edema, swelling, or drainage noted at site. P: Instructions provided on post PICC discharge care, including followup notification instructions. VSS, patient had no complaints. Discharged home with family.

## 2023-08-23 NOTE — Patient Instructions (Signed)
 PICC Removal, Adult, Care After The following information offers guidance on how to care for yourself after your procedure. Your health care provider may also give you more specific instructions. If you have problems or questions, contact your health care provider. What can I expect after the procedure? After the procedure, it is common to have: Tenderness or soreness. Redness, swelling, or a scab at the place where your PICC was removed (exit site). Follow these instructions at home: For the first 24 hours after the procedure: Keep the bandage (dressing) on your exit site clean and dry. Do not remove your dressing until your health care provider tells you to do so. Do not lift anything heavy or do activities that require great effort until your health care provider says it is okay. You should avoid: Lifting weights. Doing yard work. Doing any physical activity with repetitive arm movement. Watch closely for any signs of an air bubble in the vein (air embolism). This is a rare but serious complication. Signs of an air embolism include trouble breathing, wheezing, chest pain, or a fast pulse. If you have signs of an air embolism, call 911 right away and lie down on your left side to keep the air from moving into your lungs. After 24 hours have passed:  Remove your dressing as told by your health care provider. Wash your hands with soap and water for at least 20 seconds before and after you change your dressing. If soap and water are not available, use hand sanitizer. Return to your normal activities as told by your health care provider. A small scab may develop over the exit site. Do not pick at the scab. When bathing or showering, gently wash the exit site with soap and water. Pat it dry. Watch for signs of infection, such as: A fever or chills. Swollen glands under your arm. More redness, swelling, or soreness around your arm. Blood, fluid, or pus coming from your exit site. Warmth or a  bad smell coming from your exit site. A red streak spreading away from your exit site. General instructions Take over-the-counter and prescription medicines only as told by your health care provider. Do not take any new medicines without checking with your health care provider first. If you were given an antibiotic ointment, apply it as told by your health care provider. Keep all follow-up visits. This is important. Contact a health care provider if: You have a fever or chills. You have swelling at your exit site or swollen glands under your arm. You have signs of infection at your exit site. You have soreness, redness, or swelling in your arm that gets worse. Get help right away if: You have numbness or tingling in your fingers, hand, or arm. Your arm looks blue and feels cold. You have signs of an air embolism, such as trouble breathing, wheezing, chest pain, or a fast pulse. These symptoms may be an emergency. Get medical help right away. Call 911. Do not wait to see if the symptoms will go away. Do not drive yourself to the hospital. Summary After a PICC is removed, it is common to have tenderness or soreness, redness, swelling, or a scab at the exit site. Keep the bandage (dressing) over the exit site clean and dry. Do not remove the dressing until your health care provider tells you to do so. Do not lift anything heavy or do activities that require great effort until your health care provider says it is okay. Watch closely for any signs  of an air bubble (air embolism). If you have signs of an air embolism, call 911 right away and lie down on your left side. This information is not intended to replace advice given to you by your health care provider. Make sure you discuss any questions you have with your health care provider. Document Revised: 10/05/2020 Document Reviewed: 10/05/2020 Elsevier Patient Education  2024 [employer].

## 2023-08-26 ENCOUNTER — Encounter (HOSPITAL_BASED_OUTPATIENT_CLINIC_OR_DEPARTMENT_OTHER): Payer: Self-pay

## 2023-08-26 ENCOUNTER — Emergency Department (HOSPITAL_BASED_OUTPATIENT_CLINIC_OR_DEPARTMENT_OTHER)
Admission: EM | Admit: 2023-08-26 | Discharge: 2023-08-26 | Disposition: A | Discharge status: Home / Self Care | Attending: Emergency Medicine | Admitting: Emergency Medicine

## 2023-08-26 ENCOUNTER — Other Ambulatory Visit: Payer: Self-pay

## 2023-08-26 DIAGNOSIS — R197 Diarrhea, unspecified: Secondary | ICD-10-CM

## 2023-08-26 DIAGNOSIS — R1084 Generalized abdominal pain: Secondary | ICD-10-CM

## 2023-08-26 DIAGNOSIS — T451X5A Adverse effect of antineoplastic and immunosuppressive drugs, initial encounter: Secondary | ICD-10-CM

## 2023-08-26 DIAGNOSIS — Z7951 Long term (current) use of inhaled steroids: Secondary | ICD-10-CM | POA: Diagnosis not present

## 2023-08-26 DIAGNOSIS — E871 Hypo-osmolality and hyponatremia: Secondary | ICD-10-CM | POA: Diagnosis not present

## 2023-08-26 DIAGNOSIS — Z85048 Personal history of other malignant neoplasm of rectum, rectosigmoid junction, and anus: Secondary | ICD-10-CM | POA: Diagnosis not present

## 2023-08-26 DIAGNOSIS — R112 Nausea with vomiting, unspecified: Secondary | ICD-10-CM | POA: Diagnosis not present

## 2023-08-26 DIAGNOSIS — J45909 Unspecified asthma, uncomplicated: Secondary | ICD-10-CM | POA: Diagnosis not present

## 2023-08-26 DIAGNOSIS — R000 Tachycardia, unspecified: Secondary | ICD-10-CM | POA: Diagnosis not present

## 2023-08-26 LAB — CBC WITH DIFFERENTIAL/PLATELET
Abs Immature Granulocytes: 0.03 10*3/uL (ref 0.00–0.07)
Basophils Absolute: 0 10*3/uL (ref 0.0–0.1)
Basophils Relative: 1 %
Eosinophils Absolute: 0.1 10*3/uL (ref 0.0–0.5)
Eosinophils Relative: 3 %
HCT: 42.8 % (ref 36.0–46.0)
Hemoglobin: 14.5 g/dL (ref 12.0–15.0)
Immature Granulocytes: 1 %
Lymphocytes Relative: 7 %
Lymphs Abs: 0.2 10*3/uL — ABNORMAL LOW (ref 0.7–4.0)
MCH: 30 pg (ref 26.0–34.0)
MCHC: 33.9 g/dL (ref 30.0–36.0)
MCV: 88.6 fL (ref 80.0–100.0)
Monocytes Absolute: 0.2 10*3/uL (ref 0.1–1.0)
Monocytes Relative: 10 %
Neutro Abs: 1.7 10*3/uL (ref 1.7–7.7)
Neutrophils Relative %: 78 %
Platelets: 230 10*3/uL (ref 150–400)
RBC: 4.83 MIL/uL (ref 3.87–5.11)
RDW: 13.4 % (ref 11.5–15.5)
WBC: 2.1 10*3/uL — ABNORMAL LOW (ref 4.0–10.5)
nRBC: 0 % (ref 0.0–0.2)

## 2023-08-26 LAB — COMPREHENSIVE METABOLIC PANEL WITH GFR
ALT: 15 U/L (ref 0–44)
AST: 18 U/L (ref 15–41)
Albumin: 3.2 g/dL — ABNORMAL LOW (ref 3.5–5.0)
Alkaline Phosphatase: 63 U/L (ref 38–126)
Anion gap: 12 (ref 5–15)
BUN: 7 mg/dL — ABNORMAL LOW (ref 8–23)
CO2: 27 mmol/L (ref 22–32)
Calcium: 8.8 mg/dL — ABNORMAL LOW (ref 8.9–10.3)
Chloride: 92 mmol/L — ABNORMAL LOW (ref 98–111)
Creatinine, Ser: 0.67 mg/dL (ref 0.44–1.00)
GFR, Estimated: >60 mL/min (ref >60)
Glucose, Bld: 122 mg/dL — ABNORMAL HIGH (ref 70–99)
Potassium: 3.9 mmol/L (ref 3.5–5.1)
Sodium: 130 mmol/L — ABNORMAL LOW (ref 135–145)
Total Bilirubin: 0.2 mg/dL (ref 0.0–1.2)
Total Protein: 6.1 g/dL — ABNORMAL LOW (ref 6.5–8.1)

## 2023-08-26 MED ORDER — LACTATED RINGERS IV BOLUS
1000.0000 mL | Freq: Once | INTRAVENOUS | Status: AC
  Administered 2023-08-26: 1000 mL via INTRAVENOUS

## 2023-08-26 MED ORDER — OMEPRAZOLE 20 MG PO CPDR: 20.0000 mg | DELAYED_RELEASE_CAPSULE | Freq: Every day | ORAL | 0 refills | Status: DC

## 2023-08-26 MED ORDER — ONDANSETRON HCL 4 MG/2ML IJ SOLN
4.0000 mg | Freq: Once | INTRAMUSCULAR | Status: AC
  Administered 2023-08-26: 4 mg via INTRAVENOUS
  Filled 2023-08-26: qty 2

## 2023-08-26 MED ORDER — HYDROMORPHONE HCL 1 MG/ML IJ SOLN
0.5000 mg | Freq: Once | INTRAMUSCULAR | Status: DC
  Filled 2023-08-26: qty 1

## 2023-08-26 MED ORDER — OXYCODONE HCL 5 MG PO TABS: 5.0000 mg | ORAL_TABLET | ORAL | 0 refills | Status: DC | PRN

## 2023-08-26 MED ORDER — PANTOPRAZOLE SODIUM 40 MG IV SOLR
40.0000 mg | Freq: Once | INTRAVENOUS | Status: AC
  Administered 2023-08-26: 40 mg via INTRAVENOUS
  Filled 2023-08-26: qty 10

## 2023-08-26 MED ORDER — HYDROMORPHONE HCL 1 MG/ML IJ SOLN
0.5000 mg | Freq: Once | INTRAMUSCULAR | Status: AC
  Administered 2023-08-26: 0.5 mg via INTRAVENOUS
  Filled 2023-08-26: qty 1

## 2023-08-26 NOTE — ED Notes (Signed)
 Pt did not want IV pain medications at this time. MD notified and orders received.

## 2023-08-26 NOTE — ED Notes (Signed)
 Pt given discharge instructions and reviewed prescriptions. Opportunities given for questions. Pt verbalizes understanding. PIV removed x1. Jillyn Hidden, RN

## 2023-08-26 NOTE — ED Provider Notes (Signed)
 [place] EMERGENCY DEPARTMENT AT [facility] Provider Note  CSN: [id number] Arrival date & time: 08/26/23 8295  Chief Complaint(s) Diarrhea  HPI Rebecca [name] is a 73 y.o. female who is here today for abdominal pain, no appetite and diarrhea.  Patient has a history of anal cancer, just recently finished chemotherapy course, is currently undergoing radiation.  Symptoms have worsened over the last couple of days.  She has been having diarrhea, has not had any appetite has felt like she has gotten dehydrated.  Called her oncologist who recommended she go to the ED for further evaluation.   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 Authorizing Provider  omeprazole (PRILOSEC) 20 MG capsule Take 1 capsule (20 mg total) by mouth daily. 08/26/23 09/25/23 Yes Afton Horse T, DO  albuterol  (VENTOLIN  HFA) 108 [phone] Base) MCG/ACT inhaler Inhale 1 puff into the lungs every 6 (six) hours as needed for wheezing or shortness of breath. Patient not taking: Reported on 07/22/2023 09/05/22   Ngetich, Dinah C, NP  budesonide -formoterol  (SYMBICORT ) 80-4.5 MCG/ACT inhaler TAKE 2 PUFFS BY MOUTH TWICE A Mcnair 07/02/23   Ngetich, Dinah C, NP  docusate sodium  (COLACE) 50 MG capsule Take 50 mg by mouth 2 (two) times daily. Patient not taking: Reported on 08/19/2023    [provider]  fexofenadine  (ALLEGRA ) 180 MG tablet Take 1 tablet (180 mg total) by mouth daily. Patient taking differently: Take 180 mg by mouth every evening. 07/10/17   Verma Gobble, NP  magic mouthwash SOLN Take 5 mLs by mouth 4 (four) times daily as needed for mouth pain. Patient not taking: Reported on 08/19/2023 07/30/23   Sumner Ends, MD  ondansetron  (ZOFRAN ) 8 MG tablet Take 1 tablet (8 mg total) by mouth every 8 (eight) hours as needed for nausea or vomiting. 08/21/23   Bettejane Brownie, PA-C  prochlorperazine  (COMPAZINE ) 10 MG tablet Take 1 tablet (10 mg total) by mouth every 6 (six) hours as needed for nausea or vomiting. Patient not taking: Reported on 07/22/2023 06/28/23   Roseline Conine, NP  rosuvastatin  (CRESTOR ) 5 MG tablet TAKE 1 TABLET BY MOUTH ONCE DAILY Patient taking differently: Take 5 mg by mouth every evening. 04/26/23   Ngetich, Dinah C, NP  Past Surgical History Past Surgical History:  Procedure Laterality Date   COLONOSCOPY  07/2017   JMP-MAC-goly(good)-tics/SSP x 1   OPEN REDUCTION INTERNAL FIXATION (ORIF) DISTAL RADIAL FRACTURE Left 01/04/2021   Procedure: OPEN REDUCTION INTERNAL FIXATION (ORIF) DISTAL RADIAL FRACTURE;  Surgeon: Laneta Pintos, MD;  Location: [facility] OR;  Service: Orthopedics;  Laterality: Left;   ORIF TIBIA PLATEAU Right 03/16/2020   Procedure: OPEN REDUCTION INTERNAL FIXATION (ORIF) TIBIAL PLATEAU;  Surgeon: Laneta Pintos, MD;  Location: [facility] OR;  Service: Orthopedics;  Laterality: Right;   POLYPECTOMY  2019   SSP x 1   TRANSANAL EXCISION OF RECTAL MASS N/A 06/05/2023   Procedure: TRANSANAL EXCISION ANAL LESION;  Surgeon: Joyce Nixon, MD;  Location: [facility] ORS;  Service: General;  Laterality: N/A;   WISDOM TOOTH EXTRACTION     Family History Family History  Problem Relation Age of Onset   Colon polyps Mother  56   Cancer - Colon Mother 67   Osteoporosis Mother    Bipolar disorder Mother    Mental illness Mother    Colon cancer Mother 30   Other Father 30       airplane accident   Diabetes Mellitus II Paternal Grandfather    Anxiety disorder Daughter 50   Esophageal cancer Neg Hx    Stomach cancer Neg Hx    Rectal cancer Neg Hx     Social History Social History   Tobacco Use   Smoking status: Never   Smokeless tobacco: Never  Vaping Use   Vaping status: Never Used  Substance Use Topics   Alcohol use: Yes    Alcohol/week: 7.0 standard drinks of alcohol    Types: 7 Glasses of wine per week   Drug use: Never   Allergies Patient has no known allergies.  Review of Systems Review of Systems  Physical Exam Vital Signs  I have reviewed the triage vital signs BP (!) 131/112 (BP Location: Right Arm)   Pulse (!) 108   Temp 97.9 F (36.6 C)   Resp 18   Ht 5\' 4"  (1.626 m)   Wt 44.5 kg   SpO2 95%   BMI 16.84 kg/m   Physical Exam Vitals and nursing note reviewed.  Constitutional:      Comments: Frail  Eyes:     Pupils: Pupils are equal, round, and reactive to light.  Cardiovascular:     Rate and Rhythm: Tachycardia present.     Pulses: Normal pulses.  Pulmonary:     Effort: Pulmonary effort is normal.  Abdominal:     General: Abdomen is flat.     Palpations: Abdomen is soft.     Comments: Generalized mild tenderness     ED Results and Treatments Labs (all labs ordered are listed, but only abnormal results are displayed) Labs Reviewed  COMPREHENSIVE METABOLIC PANEL WITH GFR - Abnormal; Notable for the following components:      Result Value   Sodium 130 (*)    Chloride 92 (*)    Glucose, Bld 122 (*)    BUN 7 (*)    Calcium  8.8 (*)    Total Protein 6.1 (*)    Albumin 3.2 (*)    All other components within normal limits  CBC WITH DIFFERENTIAL/PLATELET - Abnormal; Notable for the following components:   WBC 2.1 (*)    Lymphs Abs 0.2 (*)    All other  components within normal limits  Radiology No results found.  Pertinent labs & imaging results that were available during my care of the patient were reviewed by me and considered in my medical decision making (see MDM for details).  Medications Ordered in ED Medications  lactated ringers  bolus 1,000 mL (0 mLs Intravenous Stopped 08/26/23 1250)  ondansetron  (ZOFRAN ) injection 4 mg (4 mg Intravenous Given 08/26/23 1144)  HYDROmorphone  (DILAUDID ) injection 0.5 mg (0.5 mg Intravenous Given 08/26/23 1145)  lactated ringers  bolus 1,000 mL (1,000 mLs Intravenous New Bag/Given 08/26/23 1258)  ondansetron  (ZOFRAN ) injection 4 mg (4 mg Intravenous Given 08/26/23 1251)  pantoprazole (PROTONIX) injection 40 mg (40 mg Intravenous Given 08/26/23 1250)                                                                                                                                     Procedures Procedures  (including critical care time)  Medical Decision Making / ED Course   This patient presents to the ED for concern of abdominal pain and diarrhea, this involves an extensive number of treatment options, and is a complaint that carries with it a high risk of complications and morbidity.  The differential diagnosis includes side effects from treatment, acute intra-abdominal process, dehydration.  MDM: On exam, patient overall well-appearing for her condition.  She does have some generalized abdominal pain but no focal or lateralizing tenderness.  I believe that her symptoms are likely related to her recent treatments.  Will provide symptomatic management here in the ED.  I do not have CT imaging available at the ED today due to it being broken.  Discussed this with the patient and she was comfortable with proceeding with symptomatic management and blood work at this time.  If the patient's  clinical picture were to change, would arrange for transport to one of her other emergency departments.  Reassessment 1:20 PM-I have reviewed the patient's labs.  She has mild hyponatremia, normal potassium, normal electrolytes.  She is feeling quite a bit better after some pain management, IV Zofran .  Had a lengthy discussion with the patient about value of palliative care given her current treatment.  She is going to follow-up with her oncologist to discuss this further.  Patient has sufficient Zofran  at home.  Instructed the patient take it every 6 hours for nausea.  Will send her prescription for omeprazole.   Additional history obtained: -Additional history obtained from husband at bedside -External records from outside source obtained and reviewed including: Chart review including previous notes, labs, imaging, consultation notes   Lab Tests: -I ordered, reviewed, and interpreted labs.   The pertinent results include:   Labs Reviewed  COMPREHENSIVE METABOLIC PANEL WITH GFR - Abnormal; Notable for the following components:      Result Value   Sodium 130 (*)    Chloride 92 (*)    Glucose, Bld 122 (*)    BUN 7 (*)    Calcium   8.8 (*)    Total Protein 6.1 (*)    Albumin 3.2 (*)    All other components within normal limits  CBC WITH DIFFERENTIAL/PLATELET - Abnormal; Notable for the following components:   WBC 2.1 (*)    Lymphs Abs 0.2 (*)    All other components within normal limits     Medicines ordered and prescription drug management: Meds ordered this encounter  Medications   lactated ringers  bolus 1,000 mL   ondansetron  (ZOFRAN ) injection 4 mg   HYDROmorphone  (DILAUDID ) injection 0.5 mg   lactated ringers  bolus 1,000 mL   ondansetron  (ZOFRAN ) injection 4 mg   pantoprazole (PROTONIX) injection 40 mg   omeprazole (PRILOSEC) 20 MG capsule    Sig: Take 1 capsule (20 mg total) by mouth daily.    Dispense:  30 capsule    Refill:  0    -I have reviewed the patients home  medicines and have made adjustments as needed   Cardiac Monitoring: The patient was maintained on a cardiac monitor.  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     Final Clinical Impression(s) / ED Diagnoses Final diagnoses:  Generalized abdominal pain  Diarrhea, unspecified type  Chemotherapy induced nausea and vomiting     @PCDICTATION @    Afton Horse T, DO 08/26/23 1323

## 2023-08-26 NOTE — ED Triage Notes (Signed)
 In for eval of abd pain, diarrhea, and nausea. In radiation/chemo for anal cancer. Decreased oral intake, phone on-call physician and instructed to come to ED to check for dehydration.

## 2023-08-26 NOTE — Discharge Instructions (Addendum)
 Begin taking 4 mg of Zofran  every 6 hours for the next few days.  I also recommend taking a medicine called omeprazole, 20 mg each [place].  You can begin taking it tomorrow, as you received a dose of 1 of its sister medications in the emergency room.   Please discuss seeing a palliative care specialist with your oncologist to help manage some of your symptoms.  Return to the emergency room for new or worsening pain in your abdomen or inability to eat or drink.  It was very nice to meet you both.

## 2023-08-27 ENCOUNTER — Other Ambulatory Visit: Payer: Self-pay

## 2023-08-27 ENCOUNTER — Ambulatory Visit

## 2023-08-27 ENCOUNTER — Other Ambulatory Visit

## 2023-08-27 ENCOUNTER — Ambulatory Visit
Admission: RE | Admit: 2023-08-27 | Discharge: 2023-08-27 | Disposition: A | Discharge status: Home / Self Care | Source: Ambulatory Visit | Attending: Radiation Oncology | Admitting: Radiation Oncology

## 2023-08-27 ENCOUNTER — Encounter: Payer: Self-pay | Admitting: Radiation Oncology

## 2023-08-27 ENCOUNTER — Ambulatory Visit: Admitting: Nurse Practitioner

## 2023-08-27 DIAGNOSIS — K529 Noninfective gastroenteritis and colitis, unspecified: Secondary | ICD-10-CM | POA: Diagnosis not present

## 2023-08-27 DIAGNOSIS — Z510 Encounter for antineoplastic radiation therapy: Secondary | ICD-10-CM | POA: Diagnosis not present

## 2023-08-27 DIAGNOSIS — D709 Neutropenia, unspecified: Secondary | ICD-10-CM | POA: Diagnosis not present

## 2023-08-27 DIAGNOSIS — C44520 Squamous cell carcinoma of anal skin: Secondary | ICD-10-CM | POA: Diagnosis not present

## 2023-08-27 LAB — RAD ONC ARIA SESSION SUMMARY
Course Elapsed Days: 36
Plan Fractions Treated to Date: 26
Plan Prescribed Dose Per Fraction: 1.8 Gy
Plan Total Fractions Prescribed: 28
Plan Total Prescribed Dose: 50.4 Gy
Reference Point Dosage Given to Date: 46.8 Gy
Reference Point Session Dosage Given: 1.8 Gy
Session Number: 26

## 2023-08-28 ENCOUNTER — Other Ambulatory Visit: Payer: Self-pay

## 2023-08-28 ENCOUNTER — Ambulatory Visit
Admission: RE | Admit: 2023-08-28 | Discharge: 2023-08-28 | Disposition: A | Discharge status: Home / Self Care | Source: Ambulatory Visit | Attending: Radiation Oncology

## 2023-08-28 DIAGNOSIS — C44520 Squamous cell carcinoma of anal skin: Secondary | ICD-10-CM | POA: Diagnosis not present

## 2023-08-28 DIAGNOSIS — Z510 Encounter for antineoplastic radiation therapy: Secondary | ICD-10-CM | POA: Diagnosis not present

## 2023-08-28 LAB — RAD ONC ARIA SESSION SUMMARY
Course Elapsed Days: 37
Plan Fractions Treated to Date: 27
Plan Prescribed Dose Per Fraction: 1.8 Gy
Plan Total Fractions Prescribed: 28
Plan Total Prescribed Dose: 50.4 Gy
Reference Point Dosage Given to Date: 48.6 Gy
Reference Point Session Dosage Given: 1.8 Gy
Session Number: 27

## 2023-08-29 ENCOUNTER — Ambulatory Visit
Admission: RE | Admit: 2023-08-29 | Discharge: 2023-08-29 | Disposition: A | Discharge status: Home / Self Care | Source: Ambulatory Visit | Attending: Radiation Oncology | Admitting: Radiation Oncology

## 2023-08-29 ENCOUNTER — Other Ambulatory Visit: Payer: Self-pay | Admitting: Radiation Oncology

## 2023-08-29 ENCOUNTER — Other Ambulatory Visit: Payer: Self-pay

## 2023-08-29 DIAGNOSIS — C44520 Squamous cell carcinoma of anal skin: Secondary | ICD-10-CM | POA: Diagnosis not present

## 2023-08-29 DIAGNOSIS — Z510 Encounter for antineoplastic radiation therapy: Secondary | ICD-10-CM | POA: Diagnosis not present

## 2023-08-29 DIAGNOSIS — C210 Malignant neoplasm of anus, unspecified: Secondary | ICD-10-CM

## 2023-08-29 LAB — RAD ONC ARIA SESSION SUMMARY
Course Elapsed Days: 38
Plan Fractions Treated to Date: 28
Plan Prescribed Dose Per Fraction: 1.8 Gy
Plan Total Fractions Prescribed: 28
Plan Total Prescribed Dose: 50.4 Gy
Reference Point Dosage Given to Date: 50.4 Gy
Reference Point Session Dosage Given: 1.8 Gy
Session Number: 28

## 2023-08-29 MED ORDER — SODIUM CHLORIDE 0.9 % IV SOLN
INTRAVENOUS | Status: DC
  Filled 2023-08-29 (×2): qty 150

## 2023-08-29 MED ORDER — PROCHLORPERAZINE MALEATE 10 MG PO TABS
ORAL_TABLET | ORAL | Status: AC
  Filled 2023-08-29: qty 1

## 2023-08-29 MED ORDER — PROCHLORPERAZINE MALEATE 10 MG PO TABS
10.0000 mg | ORAL_TABLET | Freq: Once | ORAL | Status: AC
  Administered 2023-08-29: 10 mg via ORAL

## 2023-08-29 MED ORDER — OXYCODONE HCL 5 MG PO TABS
5.0000 mg | ORAL_TABLET | Freq: Once | ORAL | Status: AC
  Administered 2023-08-29: 5 mg via ORAL

## 2023-08-29 MED ORDER — OXYCODONE HCL 5 MG PO TABS
ORAL_TABLET | ORAL | Status: AC
Start: 2023-08-29 — End: ?
  Filled 2023-08-29: qty 1

## 2023-08-29 MED ORDER — OXYCODONE HCL 5 MG PO TABS: 5.0000 mg | ORAL_TABLET | Freq: Four times a day (QID) | ORAL | 0 refills | Status: DC | PRN

## 2023-08-30 ENCOUNTER — Other Ambulatory Visit: Payer: Self-pay

## 2023-08-30 ENCOUNTER — Encounter: Payer: Self-pay | Admitting: Nurse Practitioner

## 2023-08-30 ENCOUNTER — Inpatient Hospital Stay: Admitting: Nurse Practitioner

## 2023-08-30 ENCOUNTER — Emergency Department (HOSPITAL_COMMUNITY)

## 2023-08-30 ENCOUNTER — Telehealth: Payer: Self-pay | Admitting: *Deleted

## 2023-08-30 ENCOUNTER — Inpatient Hospital Stay (HOSPITAL_COMMUNITY)
Admission: EM | Admit: 2023-08-30 | Discharge: 2023-09-05 | DRG: 808 | Disposition: A | Discharge status: Home / Self Care | Source: Ambulatory Visit | Attending: Internal Medicine | Admitting: Internal Medicine

## 2023-08-30 ENCOUNTER — Encounter (HOSPITAL_COMMUNITY): Payer: Self-pay

## 2023-08-30 ENCOUNTER — Inpatient Hospital Stay

## 2023-08-30 VITALS — BP 106/65 | HR 117 | Temp 97.8°F | Resp 20 | Wt 101.9 lb

## 2023-08-30 DIAGNOSIS — K56609 Unspecified intestinal obstruction, unspecified as to partial versus complete obstruction: Secondary | ICD-10-CM | POA: Diagnosis not present

## 2023-08-30 DIAGNOSIS — Z515 Encounter for palliative care: Secondary | ICD-10-CM | POA: Diagnosis not present

## 2023-08-30 DIAGNOSIS — C218 Malignant neoplasm of overlapping sites of rectum, anus and anal canal: Secondary | ICD-10-CM | POA: Diagnosis present

## 2023-08-30 DIAGNOSIS — Y842 Radiological procedure and radiotherapy as the cause of abnormal reaction of the patient, or of later complication, without mention of misadventure at the time of the procedure: Secondary | ICD-10-CM | POA: Diagnosis present

## 2023-08-30 DIAGNOSIS — R54 Age-related physical debility: Secondary | ICD-10-CM | POA: Diagnosis present

## 2023-08-30 DIAGNOSIS — Z66 Do not resuscitate: Secondary | ICD-10-CM | POA: Diagnosis present

## 2023-08-30 DIAGNOSIS — C210 Malignant neoplasm of anus, unspecified: Secondary | ICD-10-CM

## 2023-08-30 DIAGNOSIS — R000 Tachycardia, unspecified: Secondary | ICD-10-CM | POA: Diagnosis present

## 2023-08-30 DIAGNOSIS — E7849 Other hyperlipidemia: Secondary | ICD-10-CM | POA: Diagnosis present

## 2023-08-30 DIAGNOSIS — Z833 Family history of diabetes mellitus: Secondary | ICD-10-CM

## 2023-08-30 DIAGNOSIS — E43 Unspecified severe protein-calorie malnutrition: Secondary | ICD-10-CM | POA: Diagnosis present

## 2023-08-30 DIAGNOSIS — Z1152 Encounter for screening for COVID-19: Secondary | ICD-10-CM | POA: Diagnosis not present

## 2023-08-30 DIAGNOSIS — S30814A Abrasion of vagina and vulva, initial encounter: Secondary | ICD-10-CM | POA: Diagnosis present

## 2023-08-30 DIAGNOSIS — Z83719 Family history of colon polyps, unspecified: Secondary | ICD-10-CM

## 2023-08-30 DIAGNOSIS — K5669 Other partial intestinal obstruction: Secondary | ICD-10-CM | POA: Diagnosis not present

## 2023-08-30 DIAGNOSIS — Z681 Body mass index (BMI) 19 or less, adult: Secondary | ICD-10-CM

## 2023-08-30 DIAGNOSIS — Z79890 Hormone replacement therapy (postmenopausal): Secondary | ICD-10-CM | POA: Diagnosis not present

## 2023-08-30 DIAGNOSIS — K5289 Other specified noninfective gastroenteritis and colitis: Secondary | ICD-10-CM | POA: Diagnosis not present

## 2023-08-30 DIAGNOSIS — Z818 Family history of other mental and behavioral disorders: Secondary | ICD-10-CM | POA: Diagnosis not present

## 2023-08-30 DIAGNOSIS — Z7951 Long term (current) use of inhaled steroids: Secondary | ICD-10-CM

## 2023-08-30 DIAGNOSIS — E869 Volume depletion, unspecified: Secondary | ICD-10-CM | POA: Diagnosis present

## 2023-08-30 DIAGNOSIS — Z7189 Other specified counseling: Secondary | ICD-10-CM | POA: Diagnosis not present

## 2023-08-30 DIAGNOSIS — R110 Nausea: Secondary | ICD-10-CM | POA: Diagnosis not present

## 2023-08-30 DIAGNOSIS — E871 Hypo-osmolality and hyponatremia: Secondary | ICD-10-CM | POA: Diagnosis present

## 2023-08-30 DIAGNOSIS — R5081 Fever presenting with conditions classified elsewhere: Principal | ICD-10-CM | POA: Diagnosis present

## 2023-08-30 DIAGNOSIS — D709 Neutropenia, unspecified: Principal | ICD-10-CM

## 2023-08-30 DIAGNOSIS — D701 Agranulocytosis secondary to cancer chemotherapy: Secondary | ICD-10-CM | POA: Diagnosis not present

## 2023-08-30 DIAGNOSIS — R9431 Abnormal electrocardiogram [ECG] [EKG]: Secondary | ICD-10-CM | POA: Diagnosis not present

## 2023-08-30 DIAGNOSIS — R1110 Vomiting, unspecified: Secondary | ICD-10-CM | POA: Diagnosis not present

## 2023-08-30 DIAGNOSIS — T451X5A Adverse effect of antineoplastic and immunosuppressive drugs, initial encounter: Secondary | ICD-10-CM | POA: Diagnosis not present

## 2023-08-30 DIAGNOSIS — R1084 Generalized abdominal pain: Secondary | ICD-10-CM | POA: Diagnosis not present

## 2023-08-30 DIAGNOSIS — L598 Other specified disorders of the skin and subcutaneous tissue related to radiation: Secondary | ICD-10-CM | POA: Diagnosis not present

## 2023-08-30 DIAGNOSIS — D61810 Antineoplastic chemotherapy induced pancytopenia: Secondary | ICD-10-CM | POA: Diagnosis not present

## 2023-08-30 DIAGNOSIS — Z800 Family history of malignant neoplasm of digestive organs: Secondary | ICD-10-CM

## 2023-08-30 DIAGNOSIS — R64 Cachexia: Secondary | ICD-10-CM | POA: Diagnosis not present

## 2023-08-30 DIAGNOSIS — M810 Age-related osteoporosis without current pathological fracture: Secondary | ICD-10-CM | POA: Diagnosis not present

## 2023-08-30 DIAGNOSIS — K529 Noninfective gastroenteritis and colitis, unspecified: Secondary | ICD-10-CM | POA: Diagnosis not present

## 2023-08-30 DIAGNOSIS — Z8262 Family history of osteoporosis: Secondary | ICD-10-CM

## 2023-08-30 DIAGNOSIS — R630 Anorexia: Secondary | ICD-10-CM | POA: Diagnosis not present

## 2023-08-30 DIAGNOSIS — E878 Other disorders of electrolyte and fluid balance, not elsewhere classified: Secondary | ICD-10-CM | POA: Diagnosis present

## 2023-08-30 DIAGNOSIS — Z0389 Encounter for observation for other suspected diseases and conditions ruled out: Secondary | ICD-10-CM | POA: Diagnosis not present

## 2023-08-30 DIAGNOSIS — Z79899 Other long term (current) drug therapy: Secondary | ICD-10-CM | POA: Diagnosis not present

## 2023-08-30 DIAGNOSIS — R509 Fever, unspecified: Secondary | ICD-10-CM | POA: Diagnosis not present

## 2023-08-30 DIAGNOSIS — Z9109 Other allergy status, other than to drugs and biological substances: Secondary | ICD-10-CM

## 2023-08-30 DIAGNOSIS — R109 Unspecified abdominal pain: Secondary | ICD-10-CM | POA: Diagnosis not present

## 2023-08-30 LAB — CBC WITH DIFFERENTIAL/PLATELET
Abs Immature Granulocytes: 0 K/uL (ref 0.00–0.07)
Basophils Absolute: 0 K/uL (ref 0.0–0.1)
Basophils Relative: 2 %
Eosinophils Absolute: 0 K/uL (ref 0.0–0.5)
Eosinophils Relative: 0 %
HCT: 38.9 % (ref 36.0–46.0)
Hemoglobin: 13 g/dL (ref 12.0–15.0)
Immature Granulocytes: 0 %
Lymphocytes Relative: 10 %
Lymphs Abs: 0.2 K/uL — ABNORMAL LOW (ref 0.7–4.0)
MCH: 29.9 pg (ref 26.0–34.0)
MCHC: 33.4 g/dL (ref 30.0–36.0)
MCV: 89.4 fL (ref 80.0–100.0)
Monocytes Absolute: 0.4 K/uL (ref 0.1–1.0)
Monocytes Relative: 24 %
Neutro Abs: 1.1 K/uL — ABNORMAL LOW (ref 1.7–7.7)
Neutrophils Relative %: 64 %
Platelets: 212 K/uL (ref 150–400)
RBC: 4.35 MIL/uL (ref 3.87–5.11)
RDW: 13.5 % (ref 11.5–15.5)
WBC: 1.7 K/uL — ABNORMAL LOW (ref 4.0–10.5)
nRBC: 0 % (ref 0.0–0.2)

## 2023-08-30 LAB — URINALYSIS, COMPLETE (UACMP) WITH MICROSCOPIC
Bilirubin Urine: NEGATIVE
Glucose, UA: NEGATIVE mg/dL
Ketones, ur: 15 mg/dL — AB
Nitrite: NEGATIVE
Protein, ur: 30 mg/dL — AB
Specific Gravity, Urine: 1.017 (ref 1.005–1.030)
pH: 6 (ref 5.0–8.0)

## 2023-08-30 LAB — URINALYSIS, W/ REFLEX TO CULTURE (INFECTION SUSPECTED)
Bilirubin Urine: NEGATIVE
Glucose, UA: NEGATIVE mg/dL
Ketones, ur: NEGATIVE mg/dL
Leukocytes,Ua: NEGATIVE
Nitrite: NEGATIVE
Protein, ur: NEGATIVE mg/dL
Specific Gravity, Urine: 1.01 (ref 1.005–1.030)
pH: 6 (ref 5.0–8.0)

## 2023-08-30 LAB — COMPREHENSIVE METABOLIC PANEL WITH GFR
ALT: 23 U/L (ref 0–44)
AST: 33 U/L (ref 15–41)
Albumin: 2.2 g/dL — ABNORMAL LOW (ref 3.5–5.0)
Alkaline Phosphatase: 121 U/L (ref 38–126)
Anion gap: 9 (ref 5–15)
BUN: 10 mg/dL (ref 8–23)
CO2: 23 mmol/L (ref 22–32)
Calcium: 7.3 mg/dL — ABNORMAL LOW (ref 8.9–10.3)
Chloride: 89 mmol/L — ABNORMAL LOW (ref 98–111)
Creatinine, Ser: 0.67 mg/dL (ref 0.44–1.00)
GFR, Estimated: >60 mL/min (ref >60)
Glucose, Bld: 115 mg/dL — ABNORMAL HIGH (ref 70–99)
Potassium: 3.6 mmol/L (ref 3.5–5.1)
Sodium: 121 mmol/L — ABNORMAL LOW (ref 135–145)
Total Bilirubin: 0.9 mg/dL (ref 0.0–1.2)
Total Protein: 5.9 g/dL — ABNORMAL LOW (ref 6.5–8.1)

## 2023-08-30 LAB — RESP PANEL BY RT-PCR (RSV, FLU A&B, COVID)  RVPGX2
Influenza A by PCR: NEGATIVE
Influenza B by PCR: NEGATIVE
Resp Syncytial Virus by PCR: NEGATIVE
SARS Coronavirus 2 by RT PCR: NEGATIVE

## 2023-08-30 LAB — CBC WITH DIFFERENTIAL (CANCER CENTER ONLY)
Abs Immature Granulocytes: 0.03 10*3/uL (ref 0.00–0.07)
Basophils Absolute: 0 10*3/uL (ref 0.0–0.1)
Basophils Relative: 1 %
Eosinophils Absolute: 0 10*3/uL (ref 0.0–0.5)
Eosinophils Relative: 0 %
HCT: 36.1 % (ref 36.0–46.0)
Hemoglobin: 12.7 g/dL (ref 12.0–15.0)
Immature Granulocytes: 1 %
Lymphocytes Relative: 9 %
Lymphs Abs: 0.2 10*3/uL — ABNORMAL LOW (ref 0.7–4.0)
MCH: 30.5 pg (ref 26.0–34.0)
MCHC: 35.2 g/dL (ref 30.0–36.0)
MCV: 86.6 fL (ref 80.0–100.0)
Monocytes Absolute: 0.8 10*3/uL (ref 0.1–1.0)
Monocytes Relative: 36 %
Neutro Abs: 1.2 10*3/uL — ABNORMAL LOW (ref 1.7–7.7)
Neutrophils Relative %: 53 %
Platelet Count: 207 10*3/uL (ref 150–400)
RBC: 4.17 MIL/uL (ref 3.87–5.11)
RDW: 13.6 % (ref 11.5–15.5)
WBC Count: 2.3 10*3/uL — ABNORMAL LOW (ref 4.0–10.5)
nRBC: 0 % (ref 0.0–0.2)

## 2023-08-30 LAB — PROTIME-INR
INR: 1.2 (ref 0.8–1.2)
Prothrombin Time: 15.1 s (ref 11.4–15.2)

## 2023-08-30 LAB — I-STAT CG4 LACTIC ACID, ED
Lactic Acid, Venous: 1.3 mmol/L (ref 0.5–1.9)
Lactic Acid, Venous: 1.1 mmol/L (ref 0.5–1.9)

## 2023-08-30 MED ORDER — PIPERACILLIN-TAZOBACTAM 3.375 G IVPB 30 MIN: 3.3750 g | Freq: Once | INTRAVENOUS | Status: DC

## 2023-08-30 MED ORDER — OXYCODONE HCL 5 MG PO TABS
5.0000 mg | ORAL_TABLET | Freq: Four times a day (QID) | ORAL | Status: DC | PRN
  Administered 2023-08-31 – 2023-09-01 (×3): 5 mg via ORAL
  Filled 2023-08-30 (×4): qty 1

## 2023-08-30 MED ORDER — HYDROMORPHONE HCL 1 MG/ML IJ SOLN
0.5000 mg | INTRAMUSCULAR | Status: DC | PRN
  Administered 2023-09-01 – 2023-09-04 (×2): 0.5 mg via INTRAVENOUS
  Filled 2023-08-30 (×2): qty 0.5

## 2023-08-30 MED ORDER — MORPHINE SULFATE (PF) 4 MG/ML IV SOLN
4.0000 mg | Freq: Once | INTRAVENOUS | Status: AC
  Administered 2023-08-30: 4 mg via INTRAVENOUS
  Filled 2023-08-30: qty 1

## 2023-08-30 MED ORDER — ONDANSETRON HCL 4 MG PO TABS: 4.0000 mg | ORAL_TABLET | Freq: Four times a day (QID) | ORAL | Status: DC | PRN

## 2023-08-30 MED ORDER — LACTATED RINGERS IV BOLUS (SEPSIS)
1000.0000 mL | Freq: Once | INTRAVENOUS | Status: AC
  Administered 2023-08-30: 1000 mL via INTRAVENOUS

## 2023-08-30 MED ORDER — BOOST / RESOURCE BREEZE PO LIQD CUSTOM: 1.0000 | Freq: Three times a day (TID) | ORAL | Status: DC

## 2023-08-30 MED ORDER — VANCOMYCIN HCL IN DEXTROSE 1-5 GM/200ML-% IV SOLN
1000.0000 mg | Freq: Once | INTRAVENOUS | Status: AC
  Administered 2023-08-30: 1000 mg via INTRAVENOUS
  Filled 2023-08-30: qty 200

## 2023-08-30 MED ORDER — LACTATED RINGERS IV SOLN
INTRAVENOUS | Status: DC
Start: 2023-08-30 — End: 2023-08-31

## 2023-08-30 MED ORDER — PIPERACILLIN-TAZOBACTAM 3.375 G IVPB 30 MIN
3.3750 g | Freq: Once | INTRAVENOUS | Status: AC
  Administered 2023-08-30: 3.375 g via INTRAVENOUS
  Filled 2023-08-30: qty 50

## 2023-08-30 MED ORDER — PIPERACILLIN-TAZOBACTAM 3.375 G IVPB
3.3750 g | Freq: Three times a day (TID) | INTRAVENOUS | Status: DC
  Administered 2023-08-31 – 2023-09-03 (×11): 3.375 g via INTRAVENOUS
  Filled 2023-08-30 (×11): qty 50

## 2023-08-30 MED ORDER — IOHEXOL 300 MG/ML  SOLN
100.0000 mL | Freq: Once | INTRAMUSCULAR | Status: AC | PRN
  Administered 2023-08-30: 100 mL via INTRAVENOUS

## 2023-08-30 MED ORDER — SILVER SULFADIAZINE 1 % EX CREA
1.0000 | TOPICAL_CREAM | Freq: Two times a day (BID) | CUTANEOUS | Status: DC
  Administered 2023-08-31 – 2023-09-05 (×10): 1 via TOPICAL
  Filled 2023-08-30: qty 50

## 2023-08-30 MED ORDER — PANTOPRAZOLE SODIUM 40 MG IV SOLR
40.0000 mg | INTRAVENOUS | Status: DC
  Administered 2023-08-31 – 2023-09-04 (×5): 40 mg via INTRAVENOUS
  Filled 2023-08-30 (×5): qty 10

## 2023-08-30 MED ORDER — LACTATED RINGERS IV BOLUS (SEPSIS)
500.0000 mL | Freq: Once | INTRAVENOUS | Status: DC
Start: 2023-08-30 — End: 2023-08-30

## 2023-08-30 MED ORDER — ACETAMINOPHEN 650 MG RE SUPP: 650.0000 mg | Freq: Four times a day (QID) | RECTAL | Status: DC | PRN

## 2023-08-30 MED ORDER — ACETAMINOPHEN 325 MG PO TABS
650.0000 mg | ORAL_TABLET | Freq: Four times a day (QID) | ORAL | Status: DC | PRN
  Administered 2023-08-30 – 2023-09-05 (×10): 650 mg via ORAL
  Filled 2023-08-30 (×10): qty 2

## 2023-08-30 MED ORDER — SODIUM CHLORIDE 0.9 % IV BOLUS
500.0000 mL | Freq: Once | INTRAVENOUS | Status: AC
  Administered 2023-08-30: 500 mL via INTRAVENOUS

## 2023-08-30 MED ORDER — ONDANSETRON HCL 4 MG/2ML IJ SOLN
4.0000 mg | Freq: Once | INTRAMUSCULAR | Status: AC
  Administered 2023-08-30: 4 mg via INTRAVENOUS
  Filled 2023-08-30: qty 2

## 2023-08-30 MED ORDER — ONDANSETRON HCL 4 MG/2ML IJ SOLN
4.0000 mg | Freq: Four times a day (QID) | INTRAMUSCULAR | Status: DC | PRN
  Administered 2023-08-31 – 2023-09-02 (×3): 4 mg via INTRAVENOUS
  Filled 2023-08-30 (×3): qty 2

## 2023-08-30 NOTE — Progress Notes (Signed)
 Called charge nurse w/[facility] ER and provided verbal report for patient en route for febrile neutropenia, tachycardia and ulcerated labia and perineum. He will be looking out for her arrival. Gave patient/husband mask to wear in ER and chemo alert card to show to registration.

## 2023-08-30 NOTE — ED Provider Notes (Signed)
 [place] EMERGENCY DEPARTMENT AT [facility] Provider Note   CSN: [id number] Arrival date & time: 08/30/23  1652     History  Chief Complaint  Patient presents with   Fever    Antasia Haider Rufer is a 73 y.o. female.  Patient is a 73 year old female who presents to the emergency department from her oncologist office secondary to fever, generalized malaise and fatigue which has been progressing over the past few days.  Patient notes that she was tachycardic at the doctor's office.  She notes that they have been doing radiation for her rectal cancer and notes that she does have ulcerated areas along her groin and anus.  Patient denies any associated chest pain, shortness of breath.  She does admit to associated generalized abdominal pain.  She denies any other associated abnormal rashes.  She has had no abnormal headache, pain to neck or back.  She denies any cough, congestion, rhinorrhea, sore throat.   Fever      Home Medications Prior to Admission medications   Medication Sig Start Date End Date Taking? Authorizing Provider  albuterol  (VENTOLIN  HFA) 108 (90 Base) MCG/ACT inhaler Inhale 1 puff into the lungs every 6 (six) hours as needed for wheezing or shortness of breath. Patient not taking: Reported on 07/22/2023 09/05/22   Ngetich, Dinah C, NP  budesonide -formoterol  (SYMBICORT ) 80-4.5 MCG/ACT inhaler TAKE 2 PUFFS BY MOUTH TWICE A Kahan 07/02/23   Ngetich, Dinah C, NP  docusate sodium  (COLACE) 50 MG capsule Take 50 mg by mouth 2 (two) times daily. Patient not taking: Reported on 08/19/2023    [provider]  fexofenadine  (ALLEGRA ) 180 MG tablet Take 1 tablet (180 mg total) by mouth daily. Patient taking differently: Take 180 mg by mouth every evening. 07/10/17   Verma Gobble, NP  magic mouthwash SOLN Take 5 mLs by mouth 4 (four) times daily as needed for mouth pain. Patient not taking: Reported on 08/19/2023 07/30/23   Sumner Ends, MD  omeprazole  (PRILOSEC) 20  MG capsule Take 1 capsule (20 mg total) by mouth daily. 08/26/23 09/25/23  Afton Horse T, DO  ondansetron  (ZOFRAN ) 8 MG tablet Take 1 tablet (8 mg total) by mouth every 8 (eight) hours as needed for nausea or vomiting. 08/21/23   Bettejane Brownie, PA-C  oxyCODONE  (ROXICODONE ) 5 MG immediate release tablet Take 1 tablet (5 mg total) by mouth every 6 (six) hours as needed for severe pain (pain score 7-10). 08/29/23   Johna Myers, MD  prochlorperazine  (COMPAZINE ) 10 MG tablet Take 1 tablet (10 mg total) by mouth every 6 (six) hours as needed for nausea or vomiting. Patient not taking: Reported on 07/22/2023 06/28/23   Roseline Conine, NP  rosuvastatin  (CRESTOR ) 5 MG tablet TAKE 1 TABLET BY MOUTH ONCE DAILY Patient taking differently: Take 5 mg by mouth every evening. 04/26/23   Ngetich, Dinah C, NP      Allergies    Patient has no known allergies.    Review of Systems   Review of Systems  Constitutional:  Positive for fever.    Physical Exam Updated Vital Signs BP 135/71   Pulse (!) 116   Temp 98.2 F (36.8 C) (Oral)   Resp 18   Ht 5\' 4"  (1.626 m)   Wt 45.8 kg   SpO2 99%   BMI 17.34 kg/m  Physical Exam Vitals and nursing note reviewed. Exam conducted with a chaperone present.  Constitutional:      Appearance: Normal appearance.  HENT:     Head: Normocephalic and atraumatic.     Nose: Nose normal.     Mouth/Throat:     Mouth: Mucous membranes are moist.  Eyes:     Extraocular Movements: Extraocular movements intact.     Conjunctiva/sclera: Conjunctivae normal.     Pupils: Pupils are equal, round, and reactive to light.  Cardiovascular:     Rate and Rhythm: Normal rate and regular rhythm.     Pulses: Normal pulses.     Heart sounds: Normal heart sounds. No murmur heard.    No gallop.  Pulmonary:     Effort: Pulmonary effort is normal. No respiratory distress.     Breath sounds: Normal breath sounds. No stridor. No wheezing, rhonchi or rales.  Abdominal:     General:  Abdomen is flat. Bowel sounds are normal. There is no distension.     Palpations: Abdomen is soft.     Tenderness: There is abdominal tenderness. There is no guarding.  Musculoskeletal:        General: Normal range of motion.     Cervical back: Normal range of motion and neck supple. No rigidity or tenderness.     Right lower leg: No edema.     Left lower leg: No edema.  Skin:    General: Skin is warm and dry.     Comments: Ulcerated areas noted along the gluteal folds and groin region, no purulent discharge, no induration or fluctuance  Neurological:     General: No focal deficit present.     Mental Status: She is alert and oriented to person, place, and time. Mental status is at baseline.     Cranial Nerves: No cranial nerve deficit.     Sensory: No sensory deficit.  Psychiatric:        Mood and Affect: Mood normal.        Behavior: Behavior normal.        Thought Content: Thought content normal.        Judgment: Judgment normal.     ED Results / Procedures / Treatments   Labs (all labs ordered are listed, but only abnormal results are displayed) Labs Reviewed  COMPREHENSIVE METABOLIC PANEL WITH GFR - Abnormal; Notable for the following components:      Result Value   Sodium 121 (*)    Chloride 89 (*)    Glucose, Bld 115 (*)    Calcium  7.3 (*)    Total Protein 5.9 (*)    Albumin 2.2 (*)    All other components within normal limits  CBC WITH DIFFERENTIAL/PLATELET - Abnormal; Notable for the following components:   WBC 1.7 (*)    Neutro Abs 1.1 (*)    Lymphs Abs 0.2 (*)    All other components within normal limits  RESP PANEL BY RT-PCR (RSV, FLU A&B, COVID)  RVPGX2  CULTURE, BLOOD (ROUTINE X 2)  CULTURE, BLOOD (ROUTINE X 2)  URINE CULTURE  PROTIME-INR  URINALYSIS, W/ REFLEX TO CULTURE (INFECTION SUSPECTED)  I-STAT CG4 LACTIC ACID, ED  I-STAT CG4 LACTIC ACID, ED    EKG None  Radiology No results found.  Procedures Procedures    Medications Ordered in  ED Medications  lactated ringers  infusion (has no administration in time range)  vancomycin  (VANCOCIN ) IVPB 1000 mg/200 mL premix (1,000 mg Intravenous New Bag/Given 08/30/23 1808)  lactated ringers  bolus 1,000 mL (1,000 mLs Intravenous New Bag/Given 08/30/23 1810)  piperacillin -tazobactam (ZOSYN ) IVPB 3.375 g (3.375 g Intravenous New Bag/Given 08/30/23 1810)  ED Course/ Medical Decision Making/ A&P                                 Medical Decision Making Amount and/or Complexity of Data Reviewed Labs: ordered. Radiology: ordered.  Risk Prescription drug management. Decision regarding hospitalization.   This patient presents to the ED for concern of fever, abdominal pain, diarrhea, this involves an extensive number of treatment options, and is a complaint that carries with it a high risk of complications and morbidity.  The differential diagnosis includes sepsis, pneumonia, enteritis, gastroenteritis, diverticulitis, bowel perforation, urinary tract infection   Co morbidities that complicate the patient evaluation  Rectal cancer   Additional history obtained:  Additional history obtained from medical records External records from outside source obtained and reviewed including medical records   Lab Tests:  I Ordered, and personally interpreted labs.  The pertinent results include: Leukopenia, no anemia, normal platelets, hyponatremia, normal creatinine, normal liver enzymes, unremarkable urinalysis, negative viral swab, normal lactic acid   Imaging Studies ordered:  I ordered imaging studies including CT scan of abdomen and pelvis, chest x-ray I independently visualized and interpreted imaging which showed enteritis, fluid in the distal esophagus I agree with the radiologist interpretation   Consultations Obtained:  I requested consultation with the oncology, Dr. Rosaline Coma,  and discussed lab and imaging findings as well as pertinent plan - they recommend: Admission,  broad-spectrum antibiotics, IV fluids   Problem List / ED Course / Critical interventions / Medication management  Patient is doing well at this time and does remain stable.  Discussed with patient we will plan for admission to the hospital service.  Patient has been having diarrhea and will add on stool culture and C. difficile testing.  Patient has been started on broad-spectrum antibiotics given her neutropenic fever.  CT scan of the abdomen and pelvis is consistent with enteritis.  Do not suspect small bowel obstruction at this point as patient is still tolerating p.o. intake, passing gas and stool.  Patient does have associated hyponatremia and she has been gently hydrated at this point.  Have discussed patient case with Dr. Chiquita Councilman who has excepted for admission. I ordered medication including bank, Zosyn , morphine , Zofran , IV fluids for sepsis and neutropenic fever Reevaluation of the patient after these medicines showed that the patient improved I have reviewed the patients home medicines and have made adjustments as needed   Social Determinants of Health:  None   Test / Admission - Considered:  Admission        Final Clinical Impression(s) / ED Diagnoses Final diagnoses:  None    Rx / DC Orders ED Discharge Orders     None         Emmalene Hare 08/30/23 2143    Cheyenne Cotta, MD 09/01/23 1049

## 2023-08-30 NOTE — ED Triage Notes (Addendum)
 Weakness and fatigue for a few days. Pt c/o ulcerations on anus and vaginal area from where she had radiation. Possible infection from radiation sites.  Fever started today

## 2023-08-30 NOTE — Progress Notes (Signed)
 Rebecca [name] OFFICE PROGRESS NOTE   Diagnosis: Anal cancer  INTERVAL HISTORY:   Rebecca [name] is seen in an unscheduled visit for evaluation of fever.  She completed cycle two 5-FU/mitomycin  beginning 08/19/2023.  She completed the course of radiation yesterday.    She felt "warm" earlier this afternoon, checked her temperature, found it to be 100.7.  She took ibuprofen.  She denies shaking chills.  No coughing or shortness of breath.  She reports significant skin breakdown at the rectum.  She is applying Silvadene cream.  Prior to today she has been having 2-3 loose stools a Ausmus.  No loose stools so far today.  She intermittently notes blood with wiping.  No nausea or vomiting.  She denies mouth sores.  She feels very weak.  She received IV fluids yesterday and wonders if she might still be dehydrated.  No cough or shortness of breath.  Objective:  Vital signs in last 24 hours:  Blood pressure 106/65, pulse (!) 117, temperature 97.8 F (36.6 C), resp. rate 20, weight 101 lb 14.4 oz (46.2 kg), SpO2 97%.    HEENT: Ulceration right posterior buccal mucosa.  Facial flushing. Resp: Lungs clear bilaterally. Cardio: Regular, tachycardic. GI: Abdomen appears distended, nontender. Vascular: No leg edema. Neuro: Alert and oriented. Skin: Skin turgor is decreased.  Labia/perineum with a beefy red appearance, scattered areas of ulceration.   Lab Results:  Lab Results  Component Value Date   WBC 2.3 (L) 08/30/2023   HGB 12.7 08/30/2023   HCT 36.1 08/30/2023   MCV 86.6 08/30/2023   PLT 207 08/30/2023   NEUTROABS 1.2 (L) 08/30/2023    Imaging:  No results found.  Medications: I have reviewed the patient's current medications.  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, normal superficial epithelium with malignant cells undermining the muscularis mucosa  consistent with direct extension or metastasis CTs 05/14/2023-no rectal or anal mass, no lymphadenopathy or evidence of metastatic disease PET 05/21/2023-moderate hypermetabolism at the anorectal junction, no evidence of metastatic disease Transanal excision of anorectal mass 06/05/2023: Moderate to poorly differentiated squamous cell carcinoma, invasive carcinoma present at the distal and deep posterior margins, there is submucosal spread into the rectum, the proximal mucosal margin is negative for dysplasia and carcinoma.  High-grade dysplasia is present at the anterior and posterior margins, distally Radiation 07/22/2023-08/29/2023 Cycle one 5-FU/mitomycin  07/22/2023 Cycle two 5-FU/mitomycin  08/19/2023, dose reduced due to mucositis following cycle 1 Colon polyps on colonoscopy 05/06/2023: Sessile serrated polyps and tubular adenoma Colonoscopy 08/21/2017: Sessile serrated polyps at the ascending and hepatic flexure, biopsy of polypoid mucosa at the distal rectum near the dentate line-benign polypoid mucosa with a lymphoid aggregate 3.   Osteoporosis 4.   Asthma 5.  Vesicular lip/external nose lesions on exam 05/16/2023 6.  Oral mucositis secondary to chemotherapy 07/31/2023  Disposition: Rebecca [name] has anal cancer.  She completed cycle two 5-FU/mitomycin  08/19/2023 through 08/23/2023.  She completed concurrent radiation yesterday.  She presents today with a fever.  She has mild to moderate neutropenia.  She has significant skin toxicity at the labia/perineum with multiple areas of ulceration.  She is tachycardic and mildly hypotensive, may be dehydrated.  We decided the safest course of action is to proceed to the emergency department at [name] for admission with febrile neutropenia.  She and her husband agree with this plan.  I made Dr. Rosaline Coma aware of our plan.   Rebecca [name] ANP/GNP-BC  08/30/2023  4:34 PM

## 2023-08-30 NOTE — Telephone Encounter (Signed)
 Was informed by chat by radiation oncology nurse that patient has had low grade fevers today of 100.4, 100.2 and 100.0. Last RT was 5/29 and she received IVF. Noted that she was in ER on 5/26 with diarrhea and WBC was 2.1 and ANC 1.7. Called patient and left VM asking for return call asap with current temp, what has she been taking and if she has any obvious signs of infection?

## 2023-08-30 NOTE — Progress Notes (Signed)
 Pharmacy Note   A consult was received from an ED physician for vancomycin  per pharmacy dosing.    The patient's profile has been reviewed for ht/wt/allergies/indication/available labs.    A one time order has been placed for vancomycin  1000 mg IV x1 .    Further antibiotics/pharmacy consults should be ordered by admitting physician if indicated.                       Thank you,  Sabena Winner, PharmD, BCPS 08/30/2023 5:09 PM

## 2023-08-30 NOTE — Telephone Encounter (Signed)
 Rebecca [name] called back to report her temp now is 99.9 and she took ibuprofen 15 minutes ago. She says prior PICC site looks good and she has no s/s of infection. Per NP: get her in now for CBC/diff and UA. Rebecca [name] agrees and will wait in lobby for results.

## 2023-08-30 NOTE — Sepsis Progress Note (Signed)
 Elink will follow per sepsis protocol.

## 2023-08-30 NOTE — Radiation Completion Notes (Addendum)
  Radiation Oncology         [phone] ________________________________  Name: Bianco Cange Nobel MRN: [id number]  Date of Service: 08/29/2023  DOB: [birth year]  End of Treatment Note    Diagnosis:  Squamous Cell Carcinoma of the Anus   Intent: Curative     ==========DELIVERED PLANS==========  First Treatment Date: 2023-07-22 Last Treatment Date: 2023-08-29   Plan Name: Anus Site: Anus Technique: IMRT Mode: Photon Dose Per Fraction: 1.8 Gy Prescribed Dose (Delivered / Prescribed): 50.4 Gy / 50.4 Gy Prescribed Fxs (Delivered / Prescribed): 28 / 28     ==========ON TREATMENT VISIT DATES========== 2023-07-26, 2023-08-02, 2023-08-09, 2023-08-16, 2023-08-23, 2023-08-29    See weekly On Treatment Notes in Epic for details in the Media tab (listed as Progress notes on the On Treatment Visit Dates listed above). The patient tolerated radiation. She developed fatigue and anticipated skin changes in the treatment field. She did struggle with nausea and loose stool and pain in the area. She was open to nausea medication but was not interested in pain medication until after an ED visit where she was given a prescription. She was encouraged to utilize her medication for symptom management. She   The patient will receive a call in about one month from the radiation oncology department. She will continue follow up with Dr. Scherrie Curt as well as Dr. Andy Bannister in colorectal surgery.     Shelvia Dick, PAC

## 2023-08-30 NOTE — H&P (Addendum)
 History and Physical    Patient: Falon Huesca Hawes ZOX:[id number] DOB: 1950/10/03 DOA: 08/30/2023 DOS: the patient was seen and examined on 08/30/2023 PCP: Estil Heman, NP  Patient coming from: oncology clinic  Chief Complaint:  Chief Complaint  Patient presents with   Fever   HPI: Anatalia Kronk Negron is a 73 y.o. female with medical history significant for anal cancer who just completed her final dose of radiation yesterday and her final dose of chemotherapy a week ago. The patient has really been struggling for the last couple of weeks since chemo/radiation started.  She was seen in the ED last week for abdominal pain and diarrhea and poor appetite and feeling weak.  She was treated with IV fluids and pain control and was discharged to home.  Her husband relates that she only takes a bite or 2 for her meals.  She has been too weak to get out of bed for almost the last week.  This is her second ER visit in a week.  Today the patient felt warm and that she had a temp of 100.6.  They went into the oncology clinic for an unscheduled visit and were sent to the ED.  She reports abdominal distention and pain and squirty diarrhea for months.  She has some nausea.  She has had some mild intermittent vomiting but none today.  She took a total of 3 Imodium today.  She has been taking Imodium every morning to help combat her diarrhea.  She is urinating well. In the emergency department the patient had a CT scan which was significant for inflammatory versus infectious enteritis plus possible transition point and small bowel obstruction. She is neutropenic with a white blood cell count of 1.7.  Her Tmax was 100.7. She will be admitted to the hospitalist service with neutropenic precautions and IV antibiotics.   Review of Systems: As mentioned in the history of present illness. All other systems reviewed and are negative. Past Medical History:  Diagnosis Date   Allergy    seasonal   Anemia    Asthma     Osteoporosis    Other hyperlipidemia    Seasonal allergies    Shingles    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)    Past Surgical History:  Procedure Laterality Date   COLONOSCOPY  07/2017   JMP-MAC-goly(good)-tics/SSP x 1   OPEN REDUCTION INTERNAL FIXATION (ORIF) DISTAL RADIAL FRACTURE Left 01/04/2021   Procedure: OPEN REDUCTION INTERNAL FIXATION (ORIF) DISTAL RADIAL FRACTURE;  Surgeon: Laneta Pintos, MD;  Location: [facility] OR;  Service: Orthopedics;  Laterality: Left;   ORIF TIBIA PLATEAU Right 03/16/2020   Procedure: OPEN REDUCTION INTERNAL FIXATION (ORIF) TIBIAL PLATEAU;  Surgeon: Laneta Pintos, MD;  Location: [facility] OR;  Service: Orthopedics;  Laterality: Right;   POLYPECTOMY  2019   SSP x 1   TRANSANAL EXCISION OF RECTAL MASS N/A 06/05/2023   Procedure: TRANSANAL EXCISION ANAL LESION;  Surgeon: Joyce Nixon, MD;  Location: [facility] ORS;  Service: General;  Laterality: N/A;   WISDOM TOOTH EXTRACTION     Social History:  reports that she has never smoked. She has never used smokeless tobacco. She reports current alcohol use of about 7.0 standard drinks of alcohol per week. She reports that she does not use drugs.  Allergies  Allergen Reactions   Other Itching and Other (See Comments)    Pollen and cat & dog dander: Itchy eyes, asthma is triggered, nasal congestion    Family History  Problem Relation Age of Onset   Colon polyps Mother 66   Cancer - Colon Mother 51   Osteoporosis Mother    Bipolar disorder Mother    Mental illness Mother    Colon cancer Mother 35   Other Father 30       airplane accident   Diabetes Mellitus II Paternal Grandfather    Anxiety disorder Daughter 36   Esophageal cancer Neg Hx    Stomach cancer Neg Hx    Rectal cancer Neg Hx     Prior to Admission medications   Medication Sig Start Date End Date Taking? Authorizing Provider  ADVIL 200 MG CAPS Take 400 mg by mouth every 6 (six) hours as needed (for pain).   Yes [provider]   albuterol  (VENTOLIN  HFA) 108 (90 Base) MCG/ACT inhaler Inhale 1 puff into the lungs every 6 (six) hours as needed for wheezing or shortness of breath. 09/05/22  Yes Ngetich, Dinah C, NP  budesonide -formoterol  (SYMBICORT ) 80-4.5 MCG/ACT inhaler TAKE 2 PUFFS BY MOUTH TWICE A Balik Patient taking differently: Inhale 2 puffs into the lungs 2 (two) times daily as needed (for respiratory flares). 07/02/23  Yes Ngetich, Dinah C, NP  estradiol  (ESTRACE ) 0.1 MG/GM vaginal cream Place 1 Applicatorful vaginally See admin instructions. Apply as directed to the labial area in the morning and at bedtime   Yes [provider]  magic mouthwash SOLN Take 5 mLs by mouth 4 (four) times daily as needed for mouth pain. 07/30/23  Yes Sumner Ends, MD  omeprazole  (PRILOSEC) 20 MG capsule Take 1 capsule (20 mg total) by mouth daily. Patient taking differently: Take 20 mg by mouth daily before breakfast. 08/26/23 09/25/23 Yes Afton Horse T, DO  ondansetron  (ZOFRAN ) 8 MG tablet Take 1 tablet (8 mg total) by mouth every 8 (eight) hours as needed for nausea or vomiting. Patient taking differently: Take 8 mg by mouth every 6 (six) hours. 08/21/23  Yes Bettejane Brownie, PA-C  silver  sulfADIAZINE  (SILVADENE ) 1 % cream Apply 1 Application topically See admin instructions. Apply as directed to the labial area in the morning and at bedtime   Yes [provider]  docusate sodium  (COLACE) 50 MG capsule Take 50 mg by mouth 2 (two) times daily.    [provider]  fexofenadine  (ALLEGRA ) 180 MG tablet Take 1 tablet (180 mg total) by mouth daily. Patient taking differently: Take 180 mg by mouth every evening. 07/10/17   Verma Gobble, NP  oxyCODONE  (ROXICODONE ) 5 MG immediate release tablet Take 1 tablet (5 mg total) by mouth every 6 (six) hours as needed for severe pain (pain score 7-10). Patient taking differently: Take 5 mg by mouth See admin instructions. Take 4 mg by mouth every four to six hours as  needed for pain 08/29/23   Johna Myers, MD  prochlorperazine  (COMPAZINE ) 10 MG tablet Take 1 tablet (10 mg total) by mouth every 6 (six) hours as needed for nausea or vomiting. 06/28/23   Roseline Conine, NP  rosuvastatin  (CRESTOR ) 5 MG tablet TAKE 1 TABLET BY MOUTH ONCE DAILY Patient taking differently: Take 5 mg by mouth every evening. 04/26/23   Ngetich, Elijio Guadeloupe, NP    Physical Exam: Vitals:   08/30/23 1701 08/30/23 1702 08/30/23 1816 08/30/23 2043  BP:      Pulse:      Resp:  18    Temp:    98 F (36.7 C)  TempSrc:      SpO2:  Weight: 45.8 kg  45.8 kg   Height: 5\' 4"  (1.626 m)  5\' 4"  (1.626 m)    Physical Exam:  General: No acute distress, severe cachexia, frail HEENT: Normocephalic, atraumatic, PERRL Cardiovascular: Normal rate and rhythm. Distal pulses intact. Pulmonary: Normal pulmonary effort, normal breath sounds Gastrointestinal: Very distended abdomen, firm,  tender in lower quads, hypoactive bowel sounds Musculoskeletal:Normal ROM, no lower ext edema Lymphadenopathy: No cervical LAD. Skin: Skin is warm and dry. Neuro: No focal deficits noted, AAOx3. PSYCH: Attentive and cooperative  Data Reviewed:  Results for orders placed or performed during the hospital encounter of 08/30/23 (from the past 24 hours)  Comprehensive metabolic panel     Status: Abnormal   Collection Time: 08/30/23  5:20 PM  Result Value Ref Range   Sodium 121 (L) 135 - 145 mmol/L   Potassium 3.6 3.5 - 5.1 mmol/L   Chloride 89 (L) 98 - 111 mmol/L   CO2 23 22 - 32 mmol/L   Glucose, Bld 115 (H) 70 - 99 mg/dL   BUN 10 8 - 23 mg/dL   Creatinine, Ser 7.82 0.44 - 1.00 mg/dL   Calcium  7.3 (L) 8.9 - 10.3 mg/dL   Total Protein 5.9 (L) 6.5 - 8.1 g/dL   Albumin 2.2 (L) 3.5 - 5.0 g/dL   AST 33 15 - 41 U/L   ALT 23 0 - 44 U/L   Alkaline Phosphatase 121 38 - 126 U/L   Total Bilirubin 0.9 0.0 - 1.2 mg/dL   GFR, Estimated >95 >62 mL/min   Anion gap 9 5 - 15  CBC with Differential     Status: Abnormal    Collection Time: 08/30/23  5:20 PM  Result Value Ref Range   WBC 1.7 (L) 4.0 - 10.5 K/uL   RBC 4.35 3.87 - 5.11 MIL/uL   Hemoglobin 13.0 12.0 - 15.0 g/dL   HCT 13.0 86.5 - 78.4 %   MCV 89.4 80.0 - 100.0 fL   MCH 29.9 26.0 - 34.0 pg   MCHC 33.4 30.0 - 36.0 g/dL   RDW 69.6 29.5 - 28.4 %   Platelets 212 150 - 400 K/uL   nRBC 0.0 0.0 - 0.2 %   Neutrophils Relative % 64 %   Neutro Abs 1.1 (L) 1.7 - 7.7 K/uL   Lymphocytes Relative 10 %   Lymphs Abs 0.2 (L) 0.7 - 4.0 K/uL   Monocytes Relative 24 %   Monocytes Absolute 0.4 0.1 - 1.0 K/uL   Eosinophils Relative 0 %   Eosinophils Absolute 0.0 0.0 - 0.5 K/uL   Basophils Relative 2 %   Basophils Absolute 0.0 0.0 - 0.1 K/uL   Immature Granulocytes 0 %   Abs Immature Granulocytes 0.00 0.00 - 0.07 K/uL  Protime-INR     Status: None   Collection Time: 08/30/23  5:20 PM  Result Value Ref Range   Prothrombin Time 15.1 11.4 - 15.2 seconds   INR 1.2 0.8 - 1.2  I-Stat Lactic Acid, ED     Status: None   Collection Time: 08/30/23  5:37 PM  Result Value Ref Range   Lactic Acid, Venous 1.3 0.5 - 1.9 mmol/L  Resp panel by RT-PCR (RSV, Flu A&B, Covid) Anterior Nasal Swab     Status: None   Collection Time: 08/30/23  6:31 PM   Specimen: Anterior Nasal Swab  Result Value Ref Range   SARS Coronavirus 2 by RT PCR NEGATIVE NEGATIVE   Influenza A by PCR NEGATIVE NEGATIVE   Influenza B  by PCR NEGATIVE NEGATIVE   Resp Syncytial Virus by PCR NEGATIVE NEGATIVE  Urinalysis, w/ Reflex to Culture (Infection Suspected) -Urine, Clean Catch     Status: Abnormal   Collection Time: 08/30/23  8:00 PM  Result Value Ref Range   Specimen Source URINE, CLEAN CATCH    Color, Urine STRAW (A) YELLOW   APPearance CLEAR CLEAR   Specific Gravity, Urine 1.010 1.005 - 1.030   pH 6.0 5.0 - 8.0   Glucose, UA NEGATIVE NEGATIVE mg/dL   Hgb urine dipstick SMALL (A) NEGATIVE   Bilirubin Urine NEGATIVE NEGATIVE   Ketones, ur NEGATIVE NEGATIVE mg/dL   Protein, ur NEGATIVE  NEGATIVE mg/dL   Nitrite NEGATIVE NEGATIVE   Leukocytes,Ua NEGATIVE NEGATIVE   RBC / HPF 0-5 0 - 5 RBC/hpf   WBC, UA 0-5 0 - 5 WBC/hpf   Bacteria, UA RARE (A) NONE SEEN   Squamous Epithelial / HPF 0-5 0 - 5 /HPF   Mucus PRESENT   I-Stat Lactic Acid, ED     Status: None   Collection Time: 08/30/23  8:19 PM  Result Value Ref Range   Lactic Acid, Venous 1.1 0.5 - 1.9 mmol/L    IMPRESSION: 1. Moderate length segment of circumferential wall thickening and inflammation of the distal ileum/terminal ileum. This is transition point for small bowel obstruction. Findings are concerning for infectious or inflammatory enteritis. 2. Distended stomach with fluid in the distal esophagus. 3. Small hiatal hernia. 4. Mild edema and inflammation in the region of the anus. 5. Aortic atherosclerosis.   Aortic Atherosclerosis (ICD10-I70.0).   Assessment and Plan: Neutropenic fever Inflammatory versus infectious enteritis R/O  SBO S/p final dose of radiation yesterday and final dose of chemotherapy last week for Anal cancer Severe protein calorie malnutrition  - IV Zosyn   - Neutropenic precautions - IV fluids - Supportive care - Discussed CT findings with general surgery... In this patient with no vomiting and minimal nausea obstruction is less likely - supportive care  - consider NGT to suction for distension relief - clear liquid diet   Advance Care Planning:   Code Status: Prior  The patient names her husband as her surrogate decision-maker and she completed advanced directives before this hospitalization and she chose DNR.  We did discuss the likelihood of successful resuscitation back to baseline in the event that her heart stops or that she stops breathing.  Once she understood that the likelihood of her walking out of the hospital was slim, she reaffirmed her decision for DNR.  Consults: Oncology and general surgery  Family Communication: Husband at bedside  Severity of Illness: The  appropriate patient status for this patient is INPATIENT. Inpatient status is judged to be reasonable and necessary in order to provide the required intensity of service to ensure the patient's safety. The patient's presenting symptoms, physical exam findings, and initial radiographic and laboratory data in the context of their chronic comorbidities is felt to place them at high risk for further clinical deterioration. Furthermore, it is not anticipated that the patient will be medically stable for discharge from the hospital within 2 midnights of admission.   * I certify that at the point of admission it is my clinical judgment that the patient will require inpatient hospital care spanning beyond 2 midnights from the point of admission due to high intensity of service, high risk for further deterioration and high frequency of surveillance required.*  Author: Willadean Hark, MD 08/30/2023 10:08 PM  For on call review [email or web address].

## 2023-08-30 NOTE — Plan of Care (Signed)
Discuss and review plan of care with patient/family  

## 2023-08-31 ENCOUNTER — Inpatient Hospital Stay (HOSPITAL_COMMUNITY)

## 2023-08-31 DIAGNOSIS — K529 Noninfective gastroenteritis and colitis, unspecified: Secondary | ICD-10-CM | POA: Diagnosis not present

## 2023-08-31 LAB — CBC
HCT: 35.7 % — ABNORMAL LOW (ref 36.0–46.0)
Hemoglobin: 11.8 g/dL — ABNORMAL LOW (ref 12.0–15.0)
MCH: 29.9 pg (ref 26.0–34.0)
MCHC: 33.1 g/dL (ref 30.0–36.0)
MCV: 90.6 fL (ref 80.0–100.0)
Platelets: 181 10*3/uL (ref 150–400)
RBC: 3.94 MIL/uL (ref 3.87–5.11)
RDW: 13.7 % (ref 11.5–15.5)
WBC: 1.8 10*3/uL — ABNORMAL LOW (ref 4.0–10.5)
nRBC: 0 % (ref 0.0–0.2)

## 2023-08-31 LAB — BASIC METABOLIC PANEL WITH GFR
Anion gap: 5 (ref 5–15)
BUN: 7 mg/dL — ABNORMAL LOW (ref 8–23)
CO2: 25 mmol/L (ref 22–32)
Calcium: 7 mg/dL — ABNORMAL LOW (ref 8.9–10.3)
Chloride: 94 mmol/L — ABNORMAL LOW (ref 98–111)
Creatinine, Ser: 0.48 mg/dL (ref 0.44–1.00)
GFR, Estimated: >60 mL/min (ref >60)
Glucose, Bld: 94 mg/dL (ref 70–99)
Potassium: 3.8 mmol/L (ref 3.5–5.1)
Sodium: 124 mmol/L — ABNORMAL LOW (ref 135–145)

## 2023-08-31 LAB — MAGNESIUM: Magnesium: 2.1 mg/dL (ref 1.7–2.4)

## 2023-08-31 LAB — URINE CULTURE

## 2023-08-31 MED ORDER — POTASSIUM CHLORIDE 10 MEQ/100ML IV SOLN
10.0000 meq | INTRAVENOUS | Status: AC
  Administered 2023-08-31 (×2): 10 meq via INTRAVENOUS
  Filled 2023-08-31 (×2): qty 100

## 2023-08-31 MED ORDER — LACTATED RINGERS IV SOLN: INTRAVENOUS | Status: AC

## 2023-08-31 MED ORDER — DIATRIZOATE MEGLUMINE & SODIUM 66-10 % PO SOLN
90.0000 mL | Freq: Once | ORAL | Status: AC
  Administered 2023-08-31: 90 mL via ORAL
  Filled 2023-08-31: qty 90

## 2023-08-31 MED ORDER — BOOST / RESOURCE BREEZE PO LIQD CUSTOM
1.0000 | Freq: Three times a day (TID) | ORAL | Status: DC
  Administered 2023-09-01 – 2023-09-03 (×5): 1 via ORAL

## 2023-08-31 MED ORDER — POTASSIUM CHLORIDE 10 MEQ/100ML IV SOLN: 10.0000 meq | INTRAVENOUS | Status: DC

## 2023-08-31 NOTE — Plan of Care (Signed)
   Problem: Education: Goal: Knowledge of General Education information will improve Description Including pain rating scale, medication(s)/side effects and non-pharmacologic comfort measures Outcome: Progressing   Problem: Health Behavior/Discharge Planning: Goal: Ability to manage health-related needs will improve Outcome: Progressing

## 2023-08-31 NOTE — Progress Notes (Signed)
 Mobility Specialist - Progress Note   08/31/23 1135  Mobility  Activity Ambulated with assistance in hallway  Level of Assistance Modified independent, requires aide device or extra time  Assistive Device Front wheel walker  Distance Ambulated (ft) 500 ft  Activity Response Tolerated well  Mobility Referral Yes  Mobility visit 1 Mobility  Mobility Specialist Start Time (ACUTE ONLY) 1118  Mobility Specialist Stop Time (ACUTE ONLY) 1129  Mobility Specialist Time Calculation (min) (ACUTE ONLY) 11 min   Pt received in bed and agreeable to mobility. No complaints during session. Pt to bed after session with all needs met.   [facility]

## 2023-08-31 NOTE — Consult Note (Signed)
 Reason for Consult:ct with possible sbo Referring Physician: Dr Rebecca [name] is an 73 y.o. female.  HPI: 11 yof with anal cancer being treated by Rebecca [name]. [name].  She has been having some ab pain, diarrhea and decreased appetite/fatigue for past couple of weeks.  Returned to er yesterday with low grade fever and same symptoms just not better.  She had eval with wbc of 1.7, ct with enteritis and possible sbo related to this. She does not have abdominal surgery.  I was asked to see her.   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)     Past Surgical History:  Procedure Laterality Date   COLONOSCOPY  07/2017   JMP-MAC-goly(good)-tics/SSP x 1   OPEN REDUCTION INTERNAL FIXATION (ORIF) DISTAL RADIAL FRACTURE Left 01/04/2021   Procedure: OPEN REDUCTION INTERNAL FIXATION (ORIF) DISTAL RADIAL FRACTURE;  Surgeon: Laneta Pintos, MD;  Location: [facility] OR;  Service: Orthopedics;  Laterality: Left;   ORIF TIBIA PLATEAU Right 03/16/2020   Procedure: OPEN REDUCTION INTERNAL FIXATION (ORIF) TIBIAL PLATEAU;  Surgeon: Laneta Pintos, MD;  Location: [facility] OR;  Service: Orthopedics;  Laterality: Right;   POLYPECTOMY  2019   SSP x 1   TRANSANAL EXCISION OF RECTAL MASS N/A 06/05/2023   Procedure: TRANSANAL EXCISION ANAL LESION;  Surgeon: Joyce Nixon, MD;  Location: [facility];  Service: General;  Laterality: N/A;   WISDOM TOOTH EXTRACTION      Family History  Problem Relation Age of Onset   Colon polyps Mother 22   Cancer - Colon Mother 44   Osteoporosis Mother    Bipolar disorder Mother    Mental illness Mother    Colon cancer Mother 110   Other Father 30       airplane accident   Diabetes Mellitus II Paternal Grandfather    Anxiety disorder Daughter 71   Esophageal cancer Neg Hx    Stomach cancer  Neg Hx    Rectal cancer Neg Hx     Social History:  reports that she has never smoked. She has never used smokeless tobacco. She reports current alcohol use of about 7.0 standard drinks of alcohol per week. She reports that she does not use drugs.  Allergies:  Allergies  Allergen Reactions   Other Itching and Other (See Comments)    Pollen and cat & dog dander: Itchy eyes, asthma is triggered, nasal congestion    Medications: Prior to Admission:  Medications Prior to Admission  Medication Sig Dispense Refill Last Dose/Taking   ADVIL 200 MG CAPS Take 400 mg by mouth every 6 (six) hours as needed (for pain).   Past Week   albuterol  (VENTOLIN  HFA) 108 (90 Base) MCG/ACT inhaler Inhale 1 puff into the lungs every 6 (six) hours as needed for wheezing or shortness of breath. 18 g 3 Unknown   budesonide -formoterol  (SYMBICORT ) 80-4.5 MCG/ACT inhaler TAKE 2 PUFFS BY MOUTH TWICE A [place] (Patient taking differently: Inhale 2 puffs into the lungs 2 (two) times daily as needed (for respiratory flares).) 10.2 each 3 Unknown   estradiol  (ESTRACE ) 0.1 MG/GM vaginal cream Place 1 Applicatorful vaginally See admin instructions. Apply as directed to the labial area in the morning and at bedtime   08/30/2023 Morning  fexofenadine  (ALLEGRA ) 180 MG tablet Take 1 tablet (180 mg total) by mouth daily. (Patient taking differently: Take 180 mg by mouth every evening.)   08/29/2023 Evening   omeprazole  (PRILOSEC) 20 MG capsule Take 1 capsule (20 mg total) by mouth daily. (Patient taking differently: Take 20 mg by mouth daily before breakfast.) 30 capsule 0 08/30/2023 Morning   ondansetron  (ZOFRAN ) 8 MG tablet Take 1 tablet (8 mg total) by mouth every 8 (eight) hours as needed for nausea or vomiting. (Patient taking differently: Take 8 mg by mouth every 6 (six) hours.) 90 tablet 0 08/30/2023 at  1:00 PM   oxyCODONE  (ROXICODONE ) 5 MG immediate release tablet Take 1 tablet (5 mg total) by mouth every 6 (six) hours as needed for  severe pain (pain score 7-10). (Patient taking differently: Take 5 mg by mouth every 6 (six) hours as needed for moderate pain (pain score 4-6) or severe pain (pain score 7-10).) 40 tablet 0 08/30/2023 at 11:00 AM   prochlorperazine  (COMPAZINE ) 10 MG tablet Take 1 tablet (10 mg total) by mouth every 6 (six) hours as needed for nausea or vomiting. 30 tablet 3 Unknown   rosuvastatin  (CRESTOR ) 5 MG tablet TAKE 1 TABLET BY MOUTH ONCE DAILY (Patient taking differently: Take 5 mg by mouth every evening.) 90 tablet 1 08/29/2023 Evening   silver  sulfADIAZINE  (SILVADENE ) 1 % cream Apply 1 Application topically See admin instructions. Apply as directed to the labial area in the morning and at bedtime   08/30/2023 Morning   magic mouthwash SOLN Take 5 mLs by mouth 4 (four) times daily as needed for mouth pain. (Patient not taking: Reported on 08/30/2023) 240 mL 1 Not Taking    Results for orders placed or performed during the hospital encounter of 08/30/23 (from the past 48 hours)  Comprehensive metabolic panel     Status: Abnormal   Collection Time: 08/30/23  5:20 PM  Result Value Ref Range   Sodium 121 (L) 135 - 145 mmol/L   Potassium 3.6 3.5 - 5.1 mmol/L   Chloride 89 (L) 98 - 111 mmol/L   CO2 23 22 - 32 mmol/L   Glucose, Bld 115 (H) 70 - 99 mg/dL    Comment: Glucose reference range applies only to samples taken after fasting for at least 8 hours.   BUN 10 8 - 23 mg/dL   Creatinine, Ser 1.61 0.44 - 1.00 mg/dL   Calcium  7.3 (L) 8.9 - 10.3 mg/dL   Total Protein 5.9 (L) 6.5 - 8.1 g/dL   Albumin 2.2 (L) 3.5 - 5.0 g/dL   AST 33 15 - 41 U/L   ALT 23 0 - 44 U/L   Alkaline Phosphatase 121 38 - 126 U/L   Total Bilirubin 0.9 0.0 - 1.2 mg/dL   GFR, Estimated >09 >60 mL/min    Comment: (NOTE) Calculated using the CKD-EPI Creatinine Equation (2021)    Anion gap 9 5 - 15    Comment: Performed at [facility], [address] [place], [place] [address]  CBC with Differential     Status:  Abnormal   Collection Time: 08/30/23  5:20 PM  Result Value Ref Range   WBC 1.7 (L) 4.0 - 10.5 K/uL   RBC 4.35 3.87 - 5.11 MIL/uL   Hemoglobin 13.0 12.0 - 15.0 g/dL   HCT 81.1 91.4 - 78.2 %   MCV 89.4 80.0 - 100.0 fL   MCH 29.9 26.0 - 34.0 pg   MCHC 33.4 30.0 - 36.0 g/dL  RDW 13.5 11.5 - 15.5 %   Platelets 212 150 - 400 K/uL   nRBC 0.0 0.0 - 0.2 %   Neutrophils Relative % 64 %   Neutro Abs 1.1 (L) 1.7 - 7.7 K/uL   Lymphocytes Relative 10 %   Lymphs Abs 0.2 (L) 0.7 - 4.0 K/uL   Monocytes Relative 24 %   Monocytes Absolute 0.4 0.1 - 1.0 K/uL   Eosinophils Relative 0 %   Eosinophils Absolute 0.0 0.0 - 0.5 K/uL   Basophils Relative 2 %   Basophils Absolute 0.0 0.0 - 0.1 K/uL   Immature Granulocytes 0 %   Abs Immature Granulocytes 0.00 0.00 - 0.07 K/uL    Comment: Performed at [facility], [address], [place], [place] [address]  Protime-INR     Status: None   Collection Time: 08/30/23  5:20 PM  Result Value Ref Range   Prothrombin Time 15.1 11.4 - 15.2 seconds   INR 1.2 0.8 - 1.2    Comment: (NOTE) INR goal varies based on device and disease states. Performed at [facility], [address], [place], [place] [address]   I-Stat Lactic Acid, ED     Status: None   Collection Time: 08/30/23  5:37 PM  Result Value Ref Range   Lactic Acid, Venous 1.3 0.5 - 1.9 mmol/L  Resp panel by RT-PCR (RSV, Flu A&B, Covid) Anterior Nasal Swab     Status: None   Collection Time: 08/30/23  6:31 PM   Specimen: Anterior Nasal Swab  Result Value Ref Range   SARS Coronavirus 2 by RT PCR NEGATIVE NEGATIVE    Comment: (NOTE) SARS-CoV-2 target nucleic acids are NOT DETECTED.  The SARS-CoV-2 RNA is generally detectable in upper respiratory specimens during the acute phase of infection. The lowest concentration of SARS-CoV-2 viral copies this assay can detect is 138 copies/mL. A negative result does not preclude SARS-Cov-2 infection and should not be used  as the sole basis for treatment or other patient management decisions. A negative result may occur with  improper specimen collection/handling, submission of specimen other than nasopharyngeal swab, presence of viral mutation(s) within the areas targeted by this assay, and inadequate number of viral copies(<138 copies/mL). A negative result must be combined with clinical observations, patient history, and epidemiological information. The expected result is Negative.  Fact Sheet for Patients:  BloggerCourse.com  Fact Sheet for Healthcare Providers:  SeriousBroker.it  This test is no t yet approved or cleared by the [place]  FDA and  has been authorized for detection and/or diagnosis of SARS-CoV-2 by FDA under an Emergency Use Authorization (EUA). This EUA will remain  in effect (meaning this test can be used) for the duration of the COVID-19 declaration under Section 564(b)(1) of the Act, 21 U.S.C.section 360bbb-3(b)(1), unless the authorization is terminated  or revoked sooner.       Influenza A by PCR NEGATIVE NEGATIVE   Influenza B by PCR NEGATIVE NEGATIVE    Comment: (NOTE) The Xpert Xpress SARS-CoV-2/FLU/RSV plus assay is intended as an aid in the diagnosis of influenza from Nasopharyngeal swab specimens and should not be used as a sole basis for treatment. Nasal washings and aspirates are unacceptable for Xpert Xpress SARS-CoV-2/FLU/RSV testing.  Fact Sheet for Patients: BloggerCourse.com  Fact Sheet for Healthcare Providers: SeriousBroker.it  This test is not yet approved or cleared by the [place]  FDA and has been authorized for detection and/or diagnosis of SARS-CoV-2 by FDA under an Emergency Use Authorization (EUA).  This EUA will remain in effect (meaning this test can be used) for the duration of the COVID-19 declaration under Section 564(b)(1) of the Act,  21 U.S.C. section 360bbb-3(b)(1), unless the authorization is terminated or revoked.     Resp Syncytial Virus by PCR NEGATIVE NEGATIVE    Comment: (NOTE) Fact Sheet for Patients: BloggerCourse.com  Fact Sheet for Healthcare Providers: SeriousBroker.it  This test is not yet approved or cleared by the [place]  FDA and has been authorized for detection and/or diagnosis of SARS-CoV-2 by FDA under an Emergency Use Authorization (EUA). This EUA will remain in effect (meaning this test can be used) for the duration of the COVID-19 declaration under Section 564(b)(1) of the Act, 21 U.S.C. section 360bbb-3(b)(1), unless the authorization is terminated or revoked.  Performed at [facility], [address], [place], [place] [address]   Urinalysis, w/ Reflex to Culture (Infection Suspected) -Urine, Clean Catch     Status: Abnormal   Collection Time: 08/30/23  8:00 PM  Result Value Ref Range   Specimen Source URINE, CLEAN CATCH    Color, Urine STRAW (A) YELLOW   APPearance CLEAR CLEAR   Specific Gravity, Urine 1.010 1.005 - 1.030   pH 6.0 5.0 - 8.0   Glucose, UA NEGATIVE NEGATIVE mg/dL   Hgb urine dipstick SMALL (A) NEGATIVE   Bilirubin Urine NEGATIVE NEGATIVE   Ketones, ur NEGATIVE NEGATIVE mg/dL   Protein, ur NEGATIVE NEGATIVE mg/dL   Nitrite NEGATIVE NEGATIVE   Leukocytes,Ua NEGATIVE NEGATIVE   RBC / HPF 0-5 0 - 5 RBC/hpf   WBC, UA 0-5 0 - 5 WBC/hpf    Comment:        Reflex urine culture not performed if WBC <=10, OR if Squamous epithelial cells >5. If Squamous epithelial cells >5 suggest recollection.    Bacteria, UA RARE (A) NONE SEEN   Squamous Epithelial / HPF 0-5 0 - 5 /HPF   Mucus PRESENT     Comment: Performed at [facility], [address], [place], [place] [address]  I-Stat Lactic Acid, ED     Status: None   Collection Time: 08/30/23  8:19 PM  Result Value Ref Range    Lactic Acid, Venous 1.1 0.5 - 1.9 mmol/L    CT ABDOMEN PELVIS W CONTRAST Result Date: 08/30/2023 CLINICAL DATA:  Generalized abdominal pain and fever.  Anal cancer. EXAM: CT ABDOMEN AND PELVIS WITH CONTRAST TECHNIQUE: Multidetector CT imaging of the abdomen and pelvis was performed using the standard protocol following bolus administration of intravenous contrast. RADIATION DOSE REDUCTION: This exam was performed according to the departmental dose-optimization program which includes automated exposure control, adjustment of the mA and/or kV according to patient size and/or use of iterative reconstruction technique. CONTRAST:  OMNIPAQUE  IOHEXOL  300 MG/ML  SOLN COMPARISON:  Head CT 05/21/2023. FINDINGS: Lower chest: No acute abnormality. Hepatobiliary: No focal liver abnormality is seen. No gallstones, gallbladder wall thickening, or biliary dilatation. Pancreas: Unremarkable. No pancreatic ductal dilatation or surrounding inflammatory changes. Spleen: Normal in size without focal abnormality. Adrenals/Urinary Tract: Adrenal glands are unremarkable. Kidneys are normal, without renal calculi, focal lesion, or hydronephrosis. Bladder is unremarkable. Stomach/Bowel: There is a moderate length segment of circumferential wall thickening and inflammation of the distal ileum/terminal ileum. This is transition point for small bowel obstruction were dilated bowel loops measure up to 3.3 cm proximal to this level. The stomach is distended and fluid-filled. There is a small hiatal hernia. There is also fluid within the distal  esophagus. The appendix is not seen. The colon is nondilated there is some edema and mild inflammation in the region theanus. Vascular/Lymphatic: Aortic atherosclerosis. No enlarged abdominal or pelvic lymph nodes. Reproductive: Uterus and bilateral adnexa are unremarkable. Other: No abdominal wall hernia or abnormality. No abdominopelvic ascites. Musculoskeletal: No fracture is seen. IMPRESSION:  1. Moderate length segment of circumferential wall thickening and inflammation of the distal ileum/terminal ileum. This is transition point for small bowel obstruction. Findings are concerning for infectious or inflammatory enteritis. 2. Distended stomach with fluid in the distal esophagus. 3. Small hiatal hernia. 4. Mild edema and inflammation in the region of the anus. 5. Aortic atherosclerosis. Aortic Atherosclerosis (ICD10-I70.0). Electronically Signed   By: Tyron Gallon M.D.   On: 08/30/2023 21:05   DG Chest Port 1 View Result Date: 08/30/2023 CLINICAL DATA:  Concern for sepsis. EXAM: PORTABLE CHEST 1 VIEW COMPARISON:  03/14/2020. FINDINGS: The heart size and mediastinal contours are within normal limits. Hyperinflation. No focal consolidation, pleural effusion, or pneumothorax. No acute osseous abnormality. IMPRESSION: No acute cardiopulmonary findings. Electronically Signed   By: Mannie Seek M.D.   On: 08/30/2023 19:08    Review of Systems  Constitutional:  Positive for fatigue.  Gastrointestinal:  Positive for abdominal pain, diarrhea and nausea.   Blood pressure 118/62, pulse (!) 105, temperature 97.6 F (36.4 C), temperature source Oral, resp. rate 18, height 5\' 4"  (1.626 m), weight 45.8 kg, SpO2 94%. Physical Exam Constitutional:      Appearance: She is ill-appearing.  Eyes:     General: No scleral icterus. Cardiovascular:     Rate and Rhythm: Normal rate and regular rhythm.  Pulmonary:     Effort: Pulmonary effort is normal.  Abdominal:     General: There is distension (mild).     Palpations: Abdomen is soft.     Tenderness: There is no abdominal tenderness.     Hernia: No hernia is present.  Skin:    General: Skin is warm and dry.     Capillary Refill: Capillary refill takes less than 2 seconds.  Neurological:     General: No focal deficit present.     Mental Status: She is alert.  Psychiatric:        Mood and Affect: Mood normal.        Behavior: Behavior normal.      Assessment/Plan: pSBO, enteritis -does not have indication for any surgery at this point -would treat with abx as you are doing -does not need ng, consider backing off diet if not tolerating -will do oral GG to see if obstructed -pharm dvt prophylaxis is fine from my standpoint  Rebecca [name] 08/31/2023, 7:41 AM

## 2023-08-31 NOTE — Progress Notes (Signed)
 PROGRESS NOTE    Rebecca [name]  EAV:[id number] DOB: 07/07/1950 DOA: 08/30/2023 PCP: Estil Heman, NP    Brief Narrative:  73 year old female with history of anal cancer completed last dose of chemotherapy last week, radiation therapy 1 week prior to arrival presented to infusion center with low-grade temperature, abdominal pain and reported diarrhea for a long time.  Hemodynamically stable.  Sent to ER.  Neutropenic with white cell count of 1.7 and temperature 100.7.  CT scan with inflammatory versus infectious enteritis with possible transition point but no complete small bowel obstruction.  Admitted with symptomatic management.  Oncology and surgery following.  Subjective: Patient seen and examined.  Rebecca [name] at the bedside.  No overnight events.  Denies any nausea vomiting.  Tolerating water.  Has mild distended belly.  She had 3 small semiformed stool overnight.  Remains afebrile overnight.  Assessment & Plan:   Neutropenic fever with inflammatory versus infectious enteritis: Partial small bowel obstruction secondary to above.  Tolerating clears, continue.  Gastrografin KUB today and slowly advancing diet. Neutropenic precautions.  IV Zosyn .  IV fluids.  Blood cultures. Stool studies if any more diarrhea.  Continue supportive care. This is possibly chemotherapy effect, oncology will be following. Seen by surgery.  Not expecting any surgical procedure.  Hypochloremic hyponatremia: Likely related to fluid loss.  Sodium 121 on presentation.  Repeat labs pending.  Currently on isotonic fluid that we will continue.  Slow correction recommended.  Will follow-up on other electrolytes.  Anal cancer: As above.  Oncology following.  Hyperlipidemia: Continue statin on discharge.  Severe protein calorie malnutrition: Currently without adequate intake.  Will need nutrition consult once she is able to eat regular diet.   DVT prophylaxis: SCDs Start: 08/30/23 2219   Code Status: DNR Family  Communication: Rebecca [name] at the bedside Disposition Plan: Status is: Inpatient Remains inpatient appropriate because: Symptomatic     Consultants:  General Surgery Oncology  Procedures:  None  Antimicrobials:  IV Zosyn  5/30---     Objective: Vitals:   08/30/23 2043 08/30/23 2253 08/31/23 0105 08/31/23 0510  BP:  115/71 (!) 117/55 118/62  Pulse:  (!) 120 (!) 115 (!) 105  Resp:  18 18 18   Temp: 98 F (36.7 C) 98.1 F (36.7 C) 98.4 F (36.9 C) 97.6 F (36.4 C)  TempSrc:  Oral Oral Oral  SpO2:  97% 96% 94%  Weight:      Height:        Intake/Output Summary (Last 24 hours) at 08/31/2023 0841 Last data filed at 08/31/2023 0600 Gross per 24 hour  Intake 360 ml  Output 600 ml  Net -240 ml   Filed Weights   08/30/23 1701 08/30/23 1816  Weight: 45.8 kg 45.8 kg    Examination:  General exam: Appears calm and comfortable  Frail and debilitated.  Chronically sick looking.  Not in any distress. Respiratory system: Clear to auscultation. Respiratory effort normal.  No added sounds. Cardiovascular system: S1 & S2 heard, RRR.  No pedal edema. Gastrointestinal system: Soft.  Mildly tender distended without rigidity or guarding all over.  Bowel sounds present. Central nervous system: Alert and oriented. No focal neurological deficits. Extremities: Symmetric 5 x 5 power. Skin: No rashes, lesions or ulcers     Data Reviewed: I have personally reviewed following labs and imaging studies  CBC: Recent Labs  Lab 08/26/23 1051 08/30/23 1533 08/30/23 1720  WBC 2.1* 2.3* 1.7*  NEUTROABS 1.7 1.2* 1.1*  HGB 14.5 12.7 13.0  HCT 42.8  36.1 38.9  MCV 88.6 86.6 89.4  PLT 230 207 212   Basic Metabolic Panel: Recent Labs  Lab 08/26/23 1051 08/30/23 1720  NA 130* 121*  K 3.9 3.6  CL 92* 89*  CO2 27 23  GLUCOSE 122* 115*  BUN 7* 10  CREATININE 0.67 0.67  CALCIUM  8.8* 7.3*   GFR: Estimated Creatinine Clearance: 46.6 mL/min (by C-G formula based on SCr of 0.67  mg/dL). Liver Function Tests: Recent Labs  Lab 08/26/23 1051 08/30/23 1720  AST 18 33  ALT 15 23  ALKPHOS 63 121  BILITOT 0.2 0.9  PROT 6.1* 5.9*  ALBUMIN 3.2* 2.2*   No results for input(s): "LIPASE", "AMYLASE" in the last 168 hours. No results for input(s): "AMMONIA" in the last 168 hours. Coagulation Profile: Recent Labs  Lab 08/30/23 1720  INR 1.2   Cardiac Enzymes: No results for input(s): "CKTOTAL", "CKMB", "CKMBINDEX", "TROPONINI" in the last 168 hours. BNP (last 3 results) No results for input(s): "PROBNP" in the last 8760 hours. HbA1C: No results for input(s): "HGBA1C" in the last 72 hours. CBG: No results for input(s): "GLUCAP" in the last 168 hours. Lipid Profile: No results for input(s): "CHOL", "HDL", "LDLCALC", "TRIG", "CHOLHDL", "LDLDIRECT" in the last 72 hours. Thyroid  Function Tests: No results for input(s): "TSH", "T4TOTAL", "FREET4", "T3FREE", "THYROIDAB" in the last 72 hours. Anemia Panel: No results for input(s): "VITAMINB12", "FOLATE", "FERRITIN", "TIBC", "IRON", "RETICCTPCT" in the last 72 hours. Sepsis Labs: Recent Labs  Lab 08/30/23 1737 08/30/23 2019  LATICACIDVEN 1.3 1.1    Recent Results (from the past 240 hours)  Blood Culture (routine x 2)     Status: None (Preliminary result)   Collection Time: 08/30/23  5:20 PM   Specimen: BLOOD LEFT ARM  Result Value Ref Range Status   Specimen Description   Final    BLOOD LEFT ARM Performed at [facility], [address] [place], [place] [address]    Special Requests   Final    BOTTLES DRAWN AEROBIC AND ANAEROBIC Blood Culture adequate volume Performed at [facility] [place] [facility], [address], [place], [place] [address]    Culture   Final    NO GROWTH < 12 HOURS Performed at [facility], [address] [place], [place] [address]    Report Status PENDING  Incomplete  Blood Culture (routine x 2)     Status: None (Preliminary result)   Collection Time: 08/30/23   6:00 PM   Specimen: BLOOD  Result Value Ref Range Status   Specimen Description   Final    BLOOD SITE NOT SPECIFIED Performed at [facility], [address], [place], [place] [address]    Special Requests   Final    BOTTLES DRAWN AEROBIC AND ANAEROBIC Blood Culture results may not be optimal due to an inadequate volume of blood received in culture bottles Performed at [facility], [address], [place], [place] [address]    Culture   Final    NO GROWTH < 12 HOURS Performed at [facility], [address] [place], [place] [address]    Report Status PENDING  Incomplete  Resp panel by RT-PCR (RSV, Flu A&B, Covid) Anterior Nasal Swab     Status: None   Collection Time: 08/30/23  6:31 PM   Specimen: Anterior Nasal Swab  Result Value Ref Range Status   SARS Coronavirus 2 by RT PCR NEGATIVE NEGATIVE Final    Comment: (NOTE) SARS-CoV-2 target  nucleic acids are NOT DETECTED.  The SARS-CoV-2 RNA is generally detectable in upper respiratory specimens during the acute phase of infection. The lowest concentration of SARS-CoV-2 viral copies this assay can detect is 138 copies/mL. A negative result does not preclude SARS-Cov-2 infection and should not be used as the sole basis for treatment or other patient management decisions. A negative result may occur with  improper specimen collection/handling, submission of specimen other than nasopharyngeal swab, presence of viral mutation(s) within the areas targeted by this assay, and inadequate number of viral copies(<138 copies/mL). A negative result must be combined with clinical observations, patient history, and epidemiological information. The expected result is Negative.  Fact Sheet for Patients:  BloggerCourse.com  Fact Sheet for Healthcare Providers:  SeriousBroker.it  This test is no t yet approved or cleared by the [place]  FDA and   has been authorized for detection and/or diagnosis of SARS-CoV-2 by FDA under an Emergency Use Authorization (EUA). This EUA will remain  in effect (meaning this test can be used) for the duration of the COVID-19 declaration under Section 564(b)(1) of the Act, 21 U.S.C.section 360bbb-3(b)(1), unless the authorization is terminated  or revoked sooner.       Influenza A by PCR NEGATIVE NEGATIVE Final   Influenza B by PCR NEGATIVE NEGATIVE Final    Comment: (NOTE) The Xpert Xpress SARS-CoV-2/FLU/RSV plus assay is intended as an aid in the diagnosis of influenza from Nasopharyngeal swab specimens and should not be used as a sole basis for treatment. Nasal washings and aspirates are unacceptable for Xpert Xpress SARS-CoV-2/FLU/RSV testing.  Fact Sheet for Patients: BloggerCourse.com  Fact Sheet for Healthcare Providers: SeriousBroker.it  This test is not yet approved or cleared by the [place]  FDA and has been authorized for detection and/or diagnosis of SARS-CoV-2 by FDA under an Emergency Use Authorization (EUA). This EUA will remain in effect (meaning this test can be used) for the duration of the COVID-19 declaration under Section 564(b)(1) of the Act, 21 U.S.C. section 360bbb-3(b)(1), unless the authorization is terminated or revoked.     Resp Syncytial Virus by PCR NEGATIVE NEGATIVE Final    Comment: (NOTE) Fact Sheet for Patients: BloggerCourse.com  Fact Sheet for Healthcare Providers: SeriousBroker.it  This test is not yet approved or cleared by the [place]  FDA and has been authorized for detection and/or diagnosis of SARS-CoV-2 by FDA under an Emergency Use Authorization (EUA). This EUA will remain in effect (meaning this test can be used) for the duration of the COVID-19 declaration under Section 564(b)(1) of the Act, 21 U.S.C. section 360bbb-3(b)(1),  unless the authorization is terminated or revoked.  Performed at [facility], [address] [place], [place] [address]          Radiology Studies: CT ABDOMEN PELVIS W CONTRAST Result Date: 08/30/2023 CLINICAL DATA:  Generalized abdominal pain and fever.  Anal cancer. EXAM: CT ABDOMEN AND PELVIS WITH CONTRAST TECHNIQUE: Multidetector CT imaging of the abdomen and pelvis was performed using the standard protocol following bolus administration of intravenous contrast. RADIATION DOSE REDUCTION: This exam was performed according to the departmental dose-optimization program which includes automated exposure control, adjustment of the mA and/or kV according to patient size and/or use of iterative reconstruction technique. CONTRAST:  OMNIPAQUE  IOHEXOL  300 MG/ML  SOLN COMPARISON:  Head CT 05/21/2023. FINDINGS: Lower chest: No acute abnormality. Hepatobiliary: No focal liver abnormality is seen. No gallstones, gallbladder wall thickening, or biliary dilatation. Pancreas: Unremarkable. No pancreatic ductal dilatation or surrounding inflammatory  changes. Spleen: Normal in size without focal abnormality. Adrenals/Urinary Tract: Adrenal glands are unremarkable. Kidneys are normal, without renal calculi, focal lesion, or hydronephrosis. Bladder is unremarkable. Stomach/Bowel: There is a moderate length segment of circumferential wall thickening and inflammation of the distal ileum/terminal ileum. This is transition point for small bowel obstruction were dilated bowel loops measure up to 3.3 cm proximal to this level. The stomach is distended and fluid-filled. There is a small hiatal hernia. There is also fluid within the distal esophagus. The appendix is not seen. The colon is nondilated there is some edema and mild inflammation in the region theanus. Vascular/Lymphatic: Aortic atherosclerosis. No enlarged abdominal or pelvic lymph nodes. Reproductive: Uterus and bilateral adnexa are  unremarkable. Other: No abdominal wall hernia or abnormality. No abdominopelvic ascites. Musculoskeletal: No fracture is seen. IMPRESSION: 1. Moderate length segment of circumferential wall thickening and inflammation of the distal ileum/terminal ileum. This is transition point for small bowel obstruction. Findings are concerning for infectious or inflammatory enteritis. 2. Distended stomach with fluid in the distal esophagus. 3. Small hiatal hernia. 4. Mild edema and inflammation in the region of the anus. 5. Aortic atherosclerosis. Aortic Atherosclerosis (ICD10-I70.0). Electronically Signed   By: Tyron Gallon M.D.   On: 08/30/2023 21:05   DG Chest Port 1 View Result Date: 08/30/2023 CLINICAL DATA:  Concern for sepsis. EXAM: PORTABLE CHEST 1 VIEW COMPARISON:  03/14/2020. FINDINGS: The heart size and mediastinal contours are within normal limits. Hyperinflation. No focal consolidation, pleural effusion, or pneumothorax. No acute osseous abnormality. IMPRESSION: No acute cardiopulmonary findings. Electronically Signed   By: Mannie Seek M.D.   On: 08/30/2023 19:08        Scheduled Meds:  diatrizoate meglumine-sodium  90 mL Oral Once   [START ON 09/01/2023] feeding supplement  1 Container Oral TID BM   pantoprazole  (PROTONIX ) IV  40 mg Intravenous Q24H   silver  sulfADIAZINE   1 Application Topical BID   Continuous Infusions:  lactated ringers  150 mL/hr at 08/30/23 2353   piperacillin -tazobactam (ZOSYN )  IV 3.375 g (08/31/23 0123)     LOS: 1 [place]    Time spent: 52 minutes    Vada Garibaldi, MD [facility]

## 2023-09-01 ENCOUNTER — Inpatient Hospital Stay (HOSPITAL_COMMUNITY)

## 2023-09-01 DIAGNOSIS — K529 Noninfective gastroenteritis and colitis, unspecified: Secondary | ICD-10-CM | POA: Diagnosis not present

## 2023-09-01 LAB — CBC WITH DIFFERENTIAL/PLATELET
Abs Immature Granulocytes: 0.02 10*3/uL (ref 0.00–0.07)
Basophils Absolute: 0 10*3/uL (ref 0.0–0.1)
Basophils Relative: 2 %
Eosinophils Absolute: 0 10*3/uL (ref 0.0–0.5)
Eosinophils Relative: 0 %
HCT: 36.3 % (ref 36.0–46.0)
Hemoglobin: 11.9 g/dL — ABNORMAL LOW (ref 12.0–15.0)
Immature Granulocytes: 1 %
Lymphocytes Relative: 9 %
Lymphs Abs: 0.2 10*3/uL — ABNORMAL LOW (ref 0.7–4.0)
MCH: 29.3 pg (ref 26.0–34.0)
MCHC: 32.8 g/dL (ref 30.0–36.0)
MCV: 89.4 fL (ref 80.0–100.0)
Monocytes Absolute: 0.4 10*3/uL (ref 0.1–1.0)
Monocytes Relative: 20 %
Neutro Abs: 1.4 10*3/uL — ABNORMAL LOW (ref 1.7–7.7)
Neutrophils Relative %: 68 %
Platelets: 166 10*3/uL (ref 150–400)
RBC: 4.06 MIL/uL (ref 3.87–5.11)
RDW: 14.2 % (ref 11.5–15.5)
WBC: 2.1 10*3/uL — ABNORMAL LOW (ref 4.0–10.5)
nRBC: 0 % (ref 0.0–0.2)

## 2023-09-01 LAB — COMPREHENSIVE METABOLIC PANEL WITH GFR
ALT: 17 U/L (ref 0–44)
AST: 18 U/L (ref 15–41)
Albumin: 1.7 g/dL — ABNORMAL LOW (ref 3.5–5.0)
Alkaline Phosphatase: 140 U/L — ABNORMAL HIGH (ref 38–126)
Anion gap: 6 (ref 5–15)
BUN: 9 mg/dL (ref 8–23)
CO2: 26 mmol/L (ref 22–32)
Calcium: 7.2 mg/dL — ABNORMAL LOW (ref 8.9–10.3)
Chloride: 95 mmol/L — ABNORMAL LOW (ref 98–111)
Creatinine, Ser: 0.5 mg/dL (ref 0.44–1.00)
GFR, Estimated: >60 mL/min (ref >60)
Glucose, Bld: 104 mg/dL — ABNORMAL HIGH (ref 70–99)
Potassium: 3.8 mmol/L (ref 3.5–5.1)
Sodium: 127 mmol/L — ABNORMAL LOW (ref 135–145)
Total Bilirubin: 1.2 mg/dL (ref 0.0–1.2)
Total Protein: 4.7 g/dL — ABNORMAL LOW (ref 6.5–8.1)

## 2023-09-01 LAB — PHOSPHORUS: Phosphorus: 2.2 mg/dL — ABNORMAL LOW (ref 2.5–4.6)

## 2023-09-01 LAB — MAGNESIUM: Magnesium: 2 mg/dL (ref 1.7–2.4)

## 2023-09-01 MED ORDER — POTASSIUM PHOSPHATES 15 MMOLE/5ML IV SOLN
20.0000 mmol | Freq: Once | INTRAVENOUS | Status: AC
  Administered 2023-09-01: 20 mmol via INTRAVENOUS
  Filled 2023-09-01: qty 6.67

## 2023-09-01 MED ORDER — LACTATED RINGERS IV SOLN: INTRAVENOUS | Status: AC

## 2023-09-01 NOTE — Progress Notes (Signed)
 Patient vomited bile after taking one bite of pudding. The on-call provider was notified, and the diet was changed back to clear liquids. The RN will continue to monitor the patient.

## 2023-09-01 NOTE — Progress Notes (Signed)
 Mobility Specialist - Progress Note   09/01/23 1125  Mobility  Activity Ambulated with assistance in hallway;Ambulated with assistance to bathroom  Level of Assistance Modified independent, requires aide device or extra time  Assistive Device Front wheel walker  Distance Ambulated (ft) 500 ft  Activity Response Tolerated well  Mobility Referral Yes  Mobility visit 1 Mobility  Mobility Specialist Start Time (ACUTE ONLY) 1110  Mobility Specialist Stop Time (ACUTE ONLY) 1124  Mobility Specialist Time Calculation (min) (ACUTE ONLY) 14 min   Pt received in bed and agreeable to mobility. Prior to ambulating, pt requested assistance to the bathroom. No complaints during session. Pt to bed after session with all needs met. Bed alarm on.   [facility]

## 2023-09-01 NOTE — Progress Notes (Signed)
 PROGRESS NOTE    Jaryiah Mehlman Mortimer  ZOX:[id number] DOB: 1950-11-08 DOA: 08/30/2023 PCP: Estil Heman, NP    Brief Narrative:  73 year old female with history of anal cancer completed last dose of chemotherapy last week, radiation therapy 1 [place] prior to arrival presented to infusion center with low-grade temperature, abdominal pain and reported diarrhea for a long time.  Hemodynamically stable.  Sent to ER.  Neutropenic with white cell count of 1.7 and temperature 100.7.  CT scan with inflammatory versus infectious enteritis with possible transition point but no complete small bowel obstruction.  Admitted with symptomatic management.  Oncology and surgery following.  Subjective: Patient seen and examined.  Husband and daughter also at the bedside.  Patient had bilious vomiting after trying some pudding last night and since then she had been on clears.  Passing flatus.  Mildly distended apparently.  Currently denies any pain or nausea vomiting.  Taking ginger ale.  Assessment & Plan:   Neutropenic fever with inflammatory versus infectious enteritis: Partial small bowel obstruction/ileus secondary to above.  Tolerating clears, continue.  Gastrografin shows contrast into the distal colon. Neutropenic precautions.  IV Zosyn .  IV fluids.  Blood cultures. No more diarrhea, discontinue stool studies. This is possibly chemotherapy effect, oncology will be following. Seen by surgery.  Anticipating conservative management.  Hypochloremic hyponatremia: Likely related to fluid loss.  Sodium 121-124-127 and gradually responding.  Currently on isotonic fluid that we will continue.  Slow correction recommended.    Hypophosphatemia: Replace and monitor.  Anal cancer: As above.  Oncology following.  Hyperlipidemia: Continue statin on discharge.  Severe protein calorie malnutrition: Currently without adequate intake.  Will need nutrition consult once she is able to eat regular diet.   DVT prophylaxis:  SCDs Start: 08/30/23 2219   Code Status: DNR Family Communication: Husband and daughter at the bedside. Disposition Plan: Status is: Inpatient Remains inpatient appropriate because: Symptomatic, no oral intake.     Consultants:  General Surgery Oncology  Procedures:  None  Antimicrobials:  IV Zosyn  5/30---     Objective: Vitals:   08/31/23 0855 08/31/23 1355 08/31/23 2215 09/01/23 0603  BP: 108/68 128/65 (!) 119/58 (!) 113/53  Pulse: 99 (!) 106 98 (!) 101  Resp: 18  18 18   Temp: 97.9 F (36.6 C) 97.6 F (36.4 C) 98.1 F (36.7 C) 97.8 F (36.6 C)  TempSrc: Oral Oral Oral Oral  SpO2: 98% 97% 97% 97%  Weight:      Height:        Intake/Output Summary (Last 24 hours) at 09/01/2023 1042 Last data filed at 09/01/2023 0600 Gross per 24 hour  Intake 840 ml  Output 900 ml  Net -60 ml   Filed Weights   08/30/23 1701 08/30/23 1816  Weight: 45.8 kg 45.8 kg    Examination:  General exam: Appears calm and comfortable.  Mildly anxious.  Pleasant interactive on interview. Respiratory system: Clear to auscultation. Respiratory effort normal.  No added sounds. Cardiovascular system: S1 & S2 heard, RRR.  No pedal edema. Gastrointestinal system: Soft.  Mildly distended without rigidity or guarding.  Bowel sounds present. Central nervous system: Alert and oriented. No focal neurological deficits. Extremities: Symmetric 5 x 5 power. Skin: No rashes, lesions or ulcers     Data Reviewed: I have personally reviewed following labs and imaging studies  CBC: Recent Labs  Lab 08/26/23 1051 08/30/23 1533 08/30/23 1720 08/31/23 0806 09/01/23 0748  WBC 2.1* 2.3* 1.7* 1.8* 2.1*  NEUTROABS 1.7 1.2* 1.1*  --  1.4*  HGB 14.5 12.7 13.0 11.8* 11.9*  HCT 42.8 36.1 38.9 35.7* 36.3  MCV 88.6 86.6 89.4 90.6 89.4  PLT 230 207 212 181 166   Basic Metabolic Panel: Recent Labs  Lab 08/26/23 1051 08/30/23 1720 08/31/23 0806 09/01/23 0748  NA 130* 121* 124* 127*  K 3.9 3.6 3.8  3.8  CL 92* 89* 94* 95*  CO2 27 23 25 26   GLUCOSE 122* 115* 94 104*  BUN 7* 10 7* 9  CREATININE 0.67 0.67 0.48 0.50  CALCIUM  8.8* 7.3* 7.0* 7.2*  MG  --   --  2.1 2.0  PHOS  --   --   --  2.2*   GFR: Estimated Creatinine Clearance: 46.6 mL/min (by C-G formula based on SCr of 0.5 mg/dL). Liver Function Tests: Recent Labs  Lab 08/26/23 1051 08/30/23 1720 09/01/23 0748  AST 18 33 18  ALT 15 23 17   ALKPHOS 63 121 140*  BILITOT 0.2 0.9 1.2  PROT 6.1* 5.9* 4.7*  ALBUMIN 3.2* 2.2* 1.7*   No results for input(s): "LIPASE", "AMYLASE" in the last 168 hours. No results for input(s): "AMMONIA" in the last 168 hours. Coagulation Profile: Recent Labs  Lab 08/30/23 1720  INR 1.2   Cardiac Enzymes: No results for input(s): "CKTOTAL", "CKMB", "CKMBINDEX", "TROPONINI" in the last 168 hours. BNP (last 3 results) No results for input(s): "PROBNP" in the last 8760 hours. HbA1C: No results for input(s): "HGBA1C" in the last 72 hours. CBG: No results for input(s): "GLUCAP" in the last 168 hours. Lipid Profile: No results for input(s): "CHOL", "HDL", "LDLCALC", "TRIG", "CHOLHDL", "LDLDIRECT" in the last 72 hours. Thyroid  Function Tests: No results for input(s): "TSH", "T4TOTAL", "FREET4", "T3FREE", "THYROIDAB" in the last 72 hours. Anemia Panel: No results for input(s): "VITAMINB12", "FOLATE", "FERRITIN", "TIBC", "IRON", "RETICCTPCT" in the last 72 hours. Sepsis Labs: Recent Labs  Lab 08/30/23 1737 08/30/23 2019  LATICACIDVEN 1.3 1.1    Recent Results (from the past 240 hours)  Blood Culture (routine x 2)     Status: None (Preliminary result)   Collection Time: 08/30/23  5:20 PM   Specimen: BLOOD LEFT ARM  Result Value Ref Range Status   Specimen Description   Final    BLOOD LEFT ARM Performed at [facility], [address], [place], [place] [address]    Special Requests   Final    BOTTLES DRAWN AEROBIC AND ANAEROBIC Blood Culture adequate volume Performed at [facility], [address], [place], [place] [address]    Culture   Final    NO GROWTH 2 DAYS Performed at [facility], [address], [place], [place] [address]    Report Status PENDING  Incomplete  Blood Culture (routine x 2)     Status: None (Preliminary result)   Collection Time: 08/30/23  6:00 PM   Specimen: BLOOD  Result Value Ref Range Status   Specimen Description   Final    BLOOD SITE NOT SPECIFIED Performed at [facility], [address], [place], [place] [address]    Special Requests   Final    BOTTLES DRAWN AEROBIC AND ANAEROBIC Blood Culture results may not be optimal due to an inadequate volume of blood received in culture bottles Performed at [facility], [address], [place], [place] [address]    Culture   Final    NO GROWTH 2 DAYS Performed at [facility], [address], [place],   [address]    Report Status PENDING  Incomplete  Resp panel by RT-PCR (RSV, Flu A&B, Covid) Anterior Nasal Swab     Status: None   Collection Time: 08/30/23  6:31 PM   Specimen: Anterior Nasal Swab  Result Value Ref Range Status   SARS Coronavirus 2 by RT PCR NEGATIVE NEGATIVE Final    Comment: (NOTE) SARS-CoV-2 target nucleic acids are NOT DETECTED.  The SARS-CoV-2 RNA is generally detectable in upper respiratory specimens during the acute phase of infection. The lowest concentration of SARS-CoV-2 viral copies this assay can detect is 138 copies/mL. A negative result does not preclude SARS-Cov-2 infection and should not be used as the sole basis for treatment or other patient management decisions. A negative result may occur with  improper specimen collection/handling, submission of specimen other than nasopharyngeal swab, presence of viral mutation(s) within the areas targeted by this assay, and inadequate number of viral copies(<138 copies/mL). A negative result must be combined with clinical  observations, patient history, and epidemiological information. The expected result is Negative.  Fact Sheet for Patients:  BloggerCourse.com  Fact Sheet for Healthcare Providers:  SeriousBroker.it  This test is no t yet approved or cleared by the [place]  FDA and  has been authorized for detection and/or diagnosis of SARS-CoV-2 by FDA under an Emergency Use Authorization (EUA). This EUA will remain  in effect (meaning this test can be used) for the duration of the COVID-19 declaration under Section 564(b)(1) of the Act, 21 U.S.C.section 360bbb-3(b)(1), unless the authorization is terminated  or revoked sooner.       Influenza A by PCR NEGATIVE NEGATIVE Final   Influenza B by PCR NEGATIVE NEGATIVE Final    Comment: (NOTE) The Xpert Xpress SARS-CoV-2/FLU/RSV plus assay is intended as an aid in the diagnosis of influenza from Nasopharyngeal swab specimens and should not be used as a sole basis for treatment. Nasal washings and aspirates are unacceptable for Xpert Xpress SARS-CoV-2/FLU/RSV testing.  Fact Sheet for Patients: BloggerCourse.com  Fact Sheet for Healthcare Providers: SeriousBroker.it  This test is not yet approved or cleared by the [place]  FDA and has been authorized for detection and/or diagnosis of SARS-CoV-2 by FDA under an Emergency Use Authorization (EUA). This EUA will remain in effect (meaning this test can be used) for the duration of the COVID-19 declaration under Section 564(b)(1) of the Act, 21 U.S.C. section 360bbb-3(b)(1), unless the authorization is terminated or revoked.     Resp Syncytial Virus by PCR NEGATIVE NEGATIVE Final    Comment: (NOTE) Fact Sheet for Patients: BloggerCourse.com  Fact Sheet for Healthcare Providers: SeriousBroker.it  This test is not yet approved or cleared by  the [place]  FDA and has been authorized for detection and/or diagnosis of SARS-CoV-2 by FDA under an Emergency Use Authorization (EUA). This EUA will remain in effect (meaning this test can be used) for the duration of the COVID-19 declaration under Section 564(b)(1) of the Act, 21 U.S.C. section 360bbb-3(b)(1), unless the authorization is terminated or revoked.  Performed at [facility], [address], [place], [place] [address]   Urine Culture     Status: Abnormal   Collection Time: 08/30/23  8:00 PM   Specimen: Urine, Clean Catch  Result Value Ref Range Status   Specimen Description   Final    URINE, CLEAN CATCH Performed at [facility], [address] [place], [place] [address]    Special Requests   Final    NONE Performed at [facility], [address] [place], [place] [address]    Culture MULTIPLE SPECIES PRESENT, SUGGEST RECOLLECTION (A)  Final   Report Status 08/31/2023 FINAL  Final         Radiology Studies: DG Abd Portable 1V Result Date: 09/01/2023 CLINICAL DATA:  Follow up small bowel obstruction EXAM: PORTABLE ABDOMEN - 1 VIEW COMPARISON:  08/31/2023 FINDINGS: Previously administered contrast now lies entirely within the colon. No persistent small bowel dilatation is seen. No free air is noted. Contrast is seen within the bladder. IMPRESSION: No evidence of small-bowel obstruction. Electronically Signed   By: Violeta Grey M.D.   On: 09/01/2023 10:17   DG Abd Portable 1V-Small Bowel Obstruction Protocol-initial, 8 hr delay Result Date: 08/31/2023 CLINICAL DATA:  SBO Protocol, 8 hr delay image EXAM: PORTABLE ABDOMEN - 1 VIEW COMPARISON:  X-ray abdomen 08/31/2023, CT abdomen pelvis 08/30/2023. FINDINGS: PO contrast opacifying the majority of the small bowel that is distended up to 2.9 cm. Likely PO contrast within the ascending colon. No radio-opaque calculi or other significant radiographic abnormality  are seen. IMPRESSION: PO contrast opacifying the majority of the small bowel that is distended up to 2.9 cm. Likely PO contrast within the ascending colon. Recommend attention on follow-up. Electronically Signed   By: Morgane  Naveau M.D.   On: 08/31/2023 19:31   DG Abd 1 View Result Date: 08/31/2023 CLINICAL DATA:  Abdominal pain and bloating for several weeks nausea and vomiting. Anal carcinoma. EXAM: ABDOMEN - 1 VIEW COMPARISON:  None FINDINGS: Mildly dilated small bowel loops are seen in the mid abdomen. Colonic gas is noted, but is not dilated. These findings may be seen with partial small bowel obstruction or ileus. Contrast is seen in the urinary bladder from recent CT. IMPRESSION: Partial small bowel obstruction versus ileus. Electronically Signed   By: Marlyce Sine M.D.   On: 08/31/2023 09:34   CT ABDOMEN PELVIS W CONTRAST Result Date: 08/30/2023 CLINICAL DATA:  Generalized abdominal pain and fever.  Anal cancer. EXAM: CT ABDOMEN AND PELVIS WITH CONTRAST TECHNIQUE: Multidetector CT imaging of the abdomen and pelvis was performed using the standard protocol following bolus administration of intravenous contrast. RADIATION DOSE REDUCTION: This exam was performed according to the departmental dose-optimization program which includes automated exposure control, adjustment of the mA and/or kV according to patient size and/or use of iterative reconstruction technique. CONTRAST:  OMNIPAQUE  IOHEXOL  300 MG/ML  SOLN COMPARISON:  Head CT 05/21/2023. FINDINGS: Lower chest: No acute abnormality. Hepatobiliary: No focal liver abnormality is seen. No gallstones, gallbladder wall thickening, or biliary dilatation. Pancreas: Unremarkable. No pancreatic ductal dilatation or surrounding inflammatory changes. Spleen: Normal in size without focal abnormality. Adrenals/Urinary Tract: Adrenal glands are unremarkable. Kidneys are normal, without renal calculi, focal lesion, or hydronephrosis. Bladder is unremarkable.  Stomach/Bowel: There is a moderate length segment of circumferential wall thickening and inflammation of the distal ileum/terminal ileum. This is transition point for small bowel obstruction were dilated bowel loops measure up to 3.3 cm proximal to this level. The stomach is distended and fluid-filled. There is a small hiatal hernia. There is also fluid within the distal esophagus. The appendix is not seen. The colon is nondilated there is some edema and mild inflammation in the region theanus. Vascular/Lymphatic: Aortic atherosclerosis. No enlarged abdominal or pelvic lymph nodes. Reproductive: Uterus and bilateral adnexa are unremarkable. Other: No abdominal wall hernia or abnormality. No abdominopelvic ascites. Musculoskeletal: No fracture is seen. IMPRESSION: 1. Moderate length segment of circumferential wall thickening and inflammation  of the distal ileum/terminal ileum. This is transition point for small bowel obstruction. Findings are concerning for infectious or inflammatory enteritis. 2. Distended stomach with fluid in the distal esophagus. 3. Small hiatal hernia. 4. Mild edema and inflammation in the region of the anus. 5. Aortic atherosclerosis. Aortic Atherosclerosis (ICD10-I70.0). Electronically Signed   By: Tyron Gallon M.D.   On: 08/30/2023 21:05   DG Chest Port 1 View Result Date: 08/30/2023 CLINICAL DATA:  Concern for sepsis. EXAM: PORTABLE CHEST 1 VIEW COMPARISON:  03/14/2020. FINDINGS: The heart size and mediastinal contours are within normal limits. Hyperinflation. No focal consolidation, pleural effusion, or pneumothorax. No acute osseous abnormality. IMPRESSION: No acute cardiopulmonary findings. Electronically Signed   By: Mannie Seek M.D.   On: 08/30/2023 19:08        Scheduled Meds:  feeding supplement  1 Container Oral TID BM   pantoprazole  (PROTONIX ) IV  40 mg Intravenous Q24H   silver  sulfADIAZINE   1 Application Topical BID   Continuous Infusions:  lactated ringers       piperacillin -tazobactam (ZOSYN )  IV 3.375 g (09/01/23 0146)   potassium PHOSPHATE IVPB (in mmol)       LOS: 2 days    Time spent: 52 minutes    Vada Garibaldi, MD [facility]

## 2023-09-01 NOTE — Progress Notes (Addendum)
 Subjective/Chief Complaint: Emesis after pudding, having some flatus, no stool, tol clears, feels bloated   Objective: Vital signs in last 24 hours: Temp:  [97.6 F (36.4 C)-98.1 F (36.7 C)] 97.8 F (36.6 C) (06/01 0603) Pulse Rate:  [98-106] 101 (06/01 0603) Resp:  [18] 18 (06/01 0603) BP: (108-128)/(53-68) 113/53 (06/01 0603) SpO2:  [97 %-98 %] 97 % (06/01 0603) Last BM Date : 08/30/23  Intake/Output from previous [place]: 05/31 0701 - 06/01 0700 In: 840 [P.O.:840] Out: 1600 [Urine:1600] Intake/Output this shift: No intake/output data recorded.  Ab moderate distended nontender  Lab Results:  Recent Labs    08/30/23 1720 08/31/23 0806  WBC 1.7* 1.8*  HGB 13.0 11.8*  HCT 38.9 35.7*  PLT 212 181   BMET Recent Labs    08/30/23 1720 08/31/23 0806  NA 121* 124*  K 3.6 3.8  CL 89* 94*  CO2 23 25  GLUCOSE 115* 94  BUN 10 7*  CREATININE 0.67 0.48  CALCIUM  7.3* 7.0*   PT/INR Recent Labs    08/30/23 1720  LABPROT 15.1  INR 1.2   ABG No results for input(s): "PHART", "HCO3" in the last 72 hours.  Invalid input(s): "PCO2", "PO2"  Studies/Results: DG Abd Portable 1V-Small Bowel Obstruction Protocol-initial, 8 hr delay Result Date: 08/31/2023 CLINICAL DATA:  SBO Protocol, 8 hr delay image EXAM: PORTABLE ABDOMEN - 1 VIEW COMPARISON:  X-ray abdomen 08/31/2023, CT abdomen pelvis 08/30/2023. FINDINGS: PO contrast opacifying the majority of the small bowel that is distended up to 2.9 cm. Likely PO contrast within the ascending colon. No radio-opaque calculi or other significant radiographic abnormality are seen. IMPRESSION: PO contrast opacifying the majority of the small bowel that is distended up to 2.9 cm. Likely PO contrast within the ascending colon. Recommend attention on follow-up. Electronically Signed   By: Morgane  Naveau M.D.   On: 08/31/2023 19:31   DG Abd 1 View Result Date: 08/31/2023 CLINICAL DATA:  Abdominal pain and bloating for several weeks nausea  and vomiting. Anal carcinoma. EXAM: ABDOMEN - 1 VIEW COMPARISON:  None FINDINGS: Mildly dilated small bowel loops are seen in the mid abdomen. Colonic gas is noted, but is not dilated. These findings may be seen with partial small bowel obstruction or ileus. Contrast is seen in the urinary bladder from recent CT. IMPRESSION: Partial small bowel obstruction versus ileus. Electronically Signed   By: Marlyce Sine M.D.   On: 08/31/2023 09:34   CT ABDOMEN PELVIS W CONTRAST Result Date: 08/30/2023 CLINICAL DATA:  Generalized abdominal pain and fever.  Anal cancer. EXAM: CT ABDOMEN AND PELVIS WITH CONTRAST TECHNIQUE: Multidetector CT imaging of the abdomen and pelvis was performed using the standard protocol following bolus administration of intravenous contrast. RADIATION DOSE REDUCTION: This exam was performed according to the departmental dose-optimization program which includes automated exposure control, adjustment of the mA and/or kV according to patient size and/or use of iterative reconstruction technique. CONTRAST:  OMNIPAQUE  IOHEXOL  300 MG/ML  SOLN COMPARISON:  Head CT 05/21/2023. FINDINGS: Lower chest: No acute abnormality. Hepatobiliary: No focal liver abnormality is seen. No gallstones, gallbladder wall thickening, or biliary dilatation. Pancreas: Unremarkable. No pancreatic ductal dilatation or surrounding inflammatory changes. Spleen: Normal in size without focal abnormality. Adrenals/Urinary Tract: Adrenal glands are unremarkable. Kidneys are normal, without renal calculi, focal lesion, or hydronephrosis. Bladder is unremarkable. Stomach/Bowel: There is a moderate length segment of circumferential wall thickening and inflammation of the distal ileum/terminal ileum. This is transition point for small bowel obstruction were dilated  bowel loops measure up to 3.3 cm proximal to this level. The stomach is distended and fluid-filled. There is a small hiatal hernia. There is also fluid within the distal  esophagus. The appendix is not seen. The colon is nondilated there is some edema and mild inflammation in the region theanus. Vascular/Lymphatic: Aortic atherosclerosis. No enlarged abdominal or pelvic lymph nodes. Reproductive: Uterus and bilateral adnexa are unremarkable. Other: No abdominal wall hernia or abnormality. No abdominopelvic ascites. Musculoskeletal: No fracture is seen. IMPRESSION: 1. Moderate length segment of circumferential wall thickening and inflammation of the distal ileum/terminal ileum. This is transition point for small bowel obstruction. Findings are concerning for infectious or inflammatory enteritis. 2. Distended stomach with fluid in the distal esophagus. 3. Small hiatal hernia. 4. Mild edema and inflammation in the region of the anus. 5. Aortic atherosclerosis. Aortic Atherosclerosis (ICD10-I70.0). Electronically Signed   By: Tyron Gallon M.D.   On: 08/30/2023 21:05   DG Chest Port 1 View Result Date: 08/30/2023 CLINICAL DATA:  Concern for sepsis. EXAM: PORTABLE CHEST 1 VIEW COMPARISON:  03/14/2020. FINDINGS: The heart size and mediastinal contours are within normal limits. Hyperinflation. No focal consolidation, pleural effusion, or pneumothorax. No acute osseous abnormality. IMPRESSION: No acute cardiopulmonary findings. Electronically Signed   By: Mannie Seek M.D.   On: 08/30/2023 19:08    Anti-infectives: Anti-infectives (From admission, onward)    Start     Dose/Rate Route Frequency Ordered Stop   08/31/23 0200  piperacillin -tazobactam (ZOSYN ) IVPB 3.375 g        3.375 g 12.5 mL/hr over 240 Minutes Intravenous Every 8 hours 08/30/23 2224     08/31/23 0000  piperacillin -tazobactam (ZOSYN ) IVPB 3.375 g  Status:  Discontinued        3.375 g 100 mL/hr over 30 Minutes Intravenous  Once 08/30/23 2222 08/30/23 2223   08/30/23 2230  piperacillin -tazobactam (ZOSYN ) IVPB 3.375 g  Status:  Discontinued        3.375 g 100 mL/hr over 30 Minutes Intravenous  Once 08/30/23  2222 08/30/23 2222   08/30/23 1715  piperacillin -tazobactam (ZOSYN ) IVPB 3.375 g        3.375 g 100 mL/hr over 30 Minutes Intravenous  Once 08/30/23 1705 08/30/23 1942   08/30/23 1715  vancomycin  (VANCOCIN ) IVPB 1000 mg/200 mL premix        1,000 mg 200 mL/hr over 60 Minutes Intravenous  Once 08/30/23 1709 08/30/23 1942       Assessment/Plan: pSBO, enteritis -would continue to treat conservatively likely should get better -would just do clears for now -film yesterday shows contrast in small bowel, maybe in colon consistent with partial obstruction -will check another film this am- some chance she might need ng tube and just be npo also -discussed plan with patient and her daughter  I reviewed hospitalist notes, last 24 h vitals and pain scores, last 48 h intake and output, last 24 h labs and trends, and last 24 h imaging results.   Enid Harry 09/01/2023   Xray with contrast in colon, fine to continue clears and abx

## 2023-09-02 ENCOUNTER — Encounter: Payer: Self-pay | Admitting: General Practice

## 2023-09-02 DIAGNOSIS — K529 Noninfective gastroenteritis and colitis, unspecified: Secondary | ICD-10-CM | POA: Diagnosis not present

## 2023-09-02 LAB — CBC WITH DIFFERENTIAL/PLATELET
Abs Immature Granulocytes: 0.09 10*3/uL — ABNORMAL HIGH (ref 0.00–0.07)
Basophils Absolute: 0 10*3/uL (ref 0.0–0.1)
Basophils Relative: 1 %
Eosinophils Absolute: 0 10*3/uL (ref 0.0–0.5)
Eosinophils Relative: 1 %
HCT: 34.7 % — ABNORMAL LOW (ref 36.0–46.0)
Hemoglobin: 11.7 g/dL — ABNORMAL LOW (ref 12.0–15.0)
Immature Granulocytes: 6 %
Lymphocytes Relative: 14 %
Lymphs Abs: 0.2 10*3/uL — ABNORMAL LOW (ref 0.7–4.0)
MCH: 30.2 pg (ref 26.0–34.0)
MCHC: 33.7 g/dL (ref 30.0–36.0)
MCV: 89.4 fL (ref 80.0–100.0)
Monocytes Absolute: 0.3 10*3/uL (ref 0.1–1.0)
Monocytes Relative: 17 %
Neutro Abs: 0.9 10*3/uL — ABNORMAL LOW (ref 1.7–7.7)
Neutrophils Relative %: 61 %
Platelets: 145 10*3/uL — ABNORMAL LOW (ref 150–400)
RBC: 3.88 MIL/uL (ref 3.87–5.11)
RDW: 14.4 % (ref 11.5–15.5)
WBC: 1.5 10*3/uL — ABNORMAL LOW (ref 4.0–10.5)
nRBC: 0 % (ref 0.0–0.2)

## 2023-09-02 LAB — BASIC METABOLIC PANEL WITH GFR
Anion gap: 9 (ref 5–15)
BUN: 6 mg/dL — ABNORMAL LOW (ref 8–23)
CO2: 25 mmol/L (ref 22–32)
Calcium: 7.4 mg/dL — ABNORMAL LOW (ref 8.9–10.3)
Chloride: 94 mmol/L — ABNORMAL LOW (ref 98–111)
Creatinine, Ser: 0.53 mg/dL (ref 0.44–1.00)
GFR, Estimated: >60 mL/min (ref >60)
Glucose, Bld: 105 mg/dL — ABNORMAL HIGH (ref 70–99)
Potassium: 4.1 mmol/L (ref 3.5–5.1)
Sodium: 128 mmol/L — ABNORMAL LOW (ref 135–145)

## 2023-09-02 LAB — PHOSPHORUS: Phosphorus: 1.5 mg/dL — ABNORMAL LOW (ref 2.5–4.6)

## 2023-09-02 LAB — MAGNESIUM: Magnesium: 1.8 mg/dL (ref 1.7–2.4)

## 2023-09-02 MED ORDER — LACTATED RINGERS IV SOLN: INTRAVENOUS | Status: DC

## 2023-09-02 MED ORDER — GERHARDT'S BUTT CREAM
TOPICAL_CREAM | Freq: Three times a day (TID) | CUTANEOUS | Status: DC
  Administered 2023-09-02 – 2023-09-03 (×3): 1 via TOPICAL
  Filled 2023-09-02: qty 60

## 2023-09-02 MED ORDER — SODIUM PHOSPHATES 45 MMOLE/15ML IV SOLN
45.0000 mmol | Freq: Once | INTRAVENOUS | Status: AC
  Administered 2023-09-02: 45 mmol via INTRAVENOUS
  Filled 2023-09-02: qty 15

## 2023-09-02 MED ORDER — ORAL CARE MOUTH RINSE: 15.0000 mL | OROMUCOSAL | Status: DC | PRN

## 2023-09-02 MED ORDER — ENSURE MAX PROTEIN PO LIQD
11.0000 [oz_av] | Freq: Every day | ORAL | Status: DC
  Administered 2023-09-02 – 2023-09-03 (×2): 11 [oz_av] via ORAL
  Filled 2023-09-02 (×4): qty 330

## 2023-09-02 MED ORDER — ONDANSETRON HCL 4 MG/2ML IJ SOLN
4.0000 mg | Freq: Four times a day (QID) | INTRAMUSCULAR | Status: DC
  Administered 2023-09-02 – 2023-09-04 (×8): 4 mg via INTRAVENOUS
  Filled 2023-09-02 (×8): qty 2

## 2023-09-02 NOTE — Progress Notes (Signed)
 Subjective/Chief Complaint: Reports some improvement in her abdominal pain. Reports 2 loose BMs since last night. Minimal flatus. Reports ongoing nausea as we as an episode of emesis yesterday evening.    Objective: Vital signs in last 24 hours: Temp:  [97.5 F (36.4 C)-98.3 F (36.8 C)] 98.3 F (36.8 C) (06/02 0531) Pulse Rate:  [97-106] 102 (06/02 0531) Resp:  [18] 18 (06/02 0531) BP: (109-131)/(57-83) 131/62 (06/02 0531) SpO2:  [94 %-99 %] 94 % (06/02 0531) Last BM Date : 08/30/23  Intake/Output from previous [facility]: 06/01 0701 - 06/02 0700 In: 2562.7 [P.O.:480; I.V.:1816.8; IV Piggyback:265.9] Out: 600 [Urine:600] Intake/Output this shift: No intake/output data recorded.  Ab moderate distended nontender GU: there is erythema and skin sloughing of the mons/groin consistent with history of pelvic radiation Lab Results:  Recent Labs    09/01/23 0748 09/02/23 0522  WBC 2.1* 1.5*  HGB 11.9* 11.7*  HCT 36.3 34.7*  PLT 166 145*   BMET Recent Labs    09/01/23 0748 09/02/23 0522  NA 127* 128*  K 3.8 4.1  CL 95* 94*  CO2 26 25  GLUCOSE 104* 105*  BUN 9 6*  CREATININE 0.50 0.53  CALCIUM  7.2* 7.4*   PT/INR Recent Labs    08/30/23 1720  LABPROT 15.1  INR 1.2   ABG No results for input(s): "PHART", "HCO3" in the last 72 hours.  Invalid input(s): "PCO2", "PO2"  Studies/Results: DG Abd Portable 1V Result Date: 09/01/2023 CLINICAL DATA:  Follow up small bowel obstruction EXAM: PORTABLE ABDOMEN - 1 VIEW COMPARISON:  08/31/2023 FINDINGS: Previously administered contrast now lies entirely within the colon. No persistent small bowel dilatation is seen. No free air is noted. Contrast is seen within the bladder. IMPRESSION: No evidence of small-bowel obstruction. Electronically Signed   By: Violeta Grey M.D.   On: 09/01/2023 10:17   DG Abd Portable 1V-Small Bowel Obstruction Protocol-initial, 8 hr delay Result Date: 08/31/2023 CLINICAL DATA:  SBO Protocol, 8 hr delay  image EXAM: PORTABLE ABDOMEN - 1 VIEW COMPARISON:  X-ray abdomen 08/31/2023, CT abdomen pelvis 08/30/2023. FINDINGS: PO contrast opacifying the majority of the small bowel that is distended up to 2.9 cm. Likely PO contrast within the ascending colon. No radio-opaque calculi or other significant radiographic abnormality are seen. IMPRESSION: PO contrast opacifying the majority of the small bowel that is distended up to 2.9 cm. Likely PO contrast within the ascending colon. Recommend attention on follow-up. Electronically Signed   By: Morgane  Naveau M.D.   On: 08/31/2023 19:31    Anti-infectives: Anti-infectives (From admission, onward)    Start     Dose/Rate Route Frequency Ordered Stop   08/31/23 0200  piperacillin -tazobactam (ZOSYN ) IVPB 3.375 g        3.375 g 12.5 mL/hr over 240 Minutes Intravenous Every 8 hours 08/30/23 2224     08/31/23 0000  piperacillin -tazobactam (ZOSYN ) IVPB 3.375 g  Status:  Discontinued        3.375 g 100 mL/hr over 30 Minutes Intravenous  Once 08/30/23 2222 08/30/23 2223   08/30/23 2230  piperacillin -tazobactam (ZOSYN ) IVPB 3.375 g  Status:  Discontinued        3.375 g 100 mL/hr over 30 Minutes Intravenous  Once 08/30/23 2222 08/30/23 2222   08/30/23 1715  piperacillin -tazobactam (ZOSYN ) IVPB 3.375 g        3.375 g 100 mL/hr over 30 Minutes Intravenous  Once 08/30/23 1705 08/30/23 1942   08/30/23 1715  vancomycin  (VANCOCIN ) IVPB 1000 mg/200 mL premix  1,000 mg 200 mL/hr over 60 Minutes Intravenous  Once 08/30/23 1709 08/30/23 1942       Assessment/Plan: pSBO, enteritis in the setting of anal cancer undergoing chemoradiation - continue attempted non-operative management. -film yesterday shows contrast in the colon and interval decrease in small bowel dilation. Clinically she remains partially obstructed with nausea and bloating after PO intake but also having diarrhea.  - would just do clears + ensure for now - I reached out to [facility] RN regarding  assistance with perineal skin sloughing in the setting of pelvic radiation. They recommended gerhardt's butt cream/barrier cream, which I ordered. - discussed plan with patient, significant other, and daughter   I reviewed hospitalist notes, last 24 h vitals and pain scores, last 48 h intake and output, last 24 h labs and trends, and last 24 h imaging results.   [name] 09/02/2023   Xray with contrast in colon, fine to continue clears and abx

## 2023-09-02 NOTE — Progress Notes (Signed)
 [facility] Spiritual Care Note  Per request from husband Mardelle Shady, made inpatient visit to Rebecca [name], and daughter Rexene Catching. They were very appreciative of time together, including opportunity to process their experience of the last few days. Diani notes that though she has "been stalwart through most of this," she is starting to feel more tired and down.  Provided compassionate presence, reflective listening, emotional support, and a handmade blessing blanket as a tangible gesture of encouragement. Plan to follow up tomorrow if Stephanee is still inpatient.   [id number] Dorice Gardner, [place], [facility] Pager [phone] Voicemail [phone]

## 2023-09-02 NOTE — Plan of Care (Signed)
  Problem: Education: Goal: Knowledge of General Education information will improve Description: Including pain rating scale, medication(s)/side effects and non-pharmacologic comfort measures Outcome: Progressing   Problem: Health Behavior/Discharge Planning: Goal: Ability to manage health-related needs will improve Outcome: Progressing   Problem: Clinical Measurements: Goal: Will remain free from infection Outcome: Progressing Goal: Respiratory complications will improve Outcome: Progressing Goal: Cardiovascular complication will be avoided Outcome: Progressing   Problem: Activity: Goal: Risk for activity intolerance will decrease Outcome: Progressing   Problem: Elimination: Goal: Will not experience complications related to bowel motility Outcome: Progressing   Problem: Pain Managment: Goal: General experience of comfort will improve and/or be controlled Outcome: Progressing

## 2023-09-02 NOTE — Progress Notes (Signed)
 PROGRESS NOTE    Rebecca [name]  OZH:[id number] DOB: 07/07/1950 DOA: 08/30/2023 PCP: Estil Heman, NP    Brief Narrative:  73 year old female with history of anal cancer completed last dose of chemotherapy last week, radiation therapy 1 Tindol prior to arrival presented to infusion center with low-grade temperature, abdominal pain and reported diarrhea for a long time.  Hemodynamically stable.  Sent to ER.  Neutropenic with white cell count of 1.7 and temperature 100.7.  CT scan with inflammatory versus infectious enteritis with possible transition point but no complete small bowel obstruction.  Admitted with symptomatic management.  Oncology and surgery following.  Subjective: Patient was seen and examined.  Still with significant nausea on attempted eating.  Trying to do some liquids however unable to.  Abdominal distention is mild, intermittent pain is present but she wants to avoid using oxycodone .  2 loose bowel movements overnight. Will put patient on a scheduled Zofran , keep on clear liquids.   Assessment & Plan:   Neutropenic fever with inflammatory versus infectious enteritis: Partial small bowel obstruction/ileus secondary to above.  Intermittently tolerating clears, continue.  Gastrografin showed contrast into the distal colon. Neutropenic precautions.  IV Zosyn .  IV fluids.  Blood cultures. This is possibly chemotherapy effect, oncology will be following. Seen by surgery.  Anticipating conservative management.  Hypochloremic hyponatremia: Likely related to fluid loss.  Sodium 121-124-127-128 and gradually responding.  Currently on isotonic fluid that we will continue.  Slow correction recommended.    Hypophosphatemia: Replace further today.   Anal cancer: As above.  Oncology following.  Hyperlipidemia: Continue statin on discharge. Holding due to poor oral intake.  Severe protein calorie malnutrition: Currently without adequate intake.     DVT prophylaxis: SCDs Start:  08/30/23 2219   Code Status: DNR Family Communication: Husband and daughter at the bedside. Disposition Plan: Status is: Inpatient Remains inpatient appropriate because: Symptomatic, no oral intake.     Consultants:  General Surgery Oncology  Procedures:  None  Antimicrobials:  IV Zosyn  5/30---     Objective: Vitals:   09/01/23 1400 09/01/23 2128 09/02/23 0531 09/02/23 1326  BP: (!) 118/57 109/83 131/62 129/63  Pulse: (!) 106 97 (!) 102 (!) 102  Resp: 18 18 18 18   Temp: 98.1 F (36.7 C) (!) 97.5 F (36.4 C) 98.3 F (36.8 C) 97.6 F (36.4 C)  TempSrc: Oral Oral Oral Oral  SpO2: 99% 96% 94% 97%  Weight:      Height:        Intake/Output Summary (Last 24 hours) at 09/02/2023 1336 Last data filed at 09/02/2023 0515 Gross per 24 hour  Intake 2562.66 ml  Output 600 ml  Net 1962.66 ml   Filed Weights   08/30/23 1701 08/30/23 1816  Weight: 45.8 kg 45.8 kg    Examination:  General: Frail and debilitated.  Pleasant and interactive. Cardiovascular: S1-S2 normal.  Regular rate rhythm. Respiratory: Bilateral clear.  No added sounds. Gastrointestinal: Soft.  Mildly distended.  Mild tenderness along the lower quadrants.  No rigidity or guarding.  Bowel sounds present. Ext: No swelling or edema.  No cyanosis. Neuro: Alert awake and oriented. Musculoskeletal: No deformities. Skin: Skin excoriations along the perineum secondary to radiation.      Data Reviewed: I have personally reviewed following labs and imaging studies  CBC: Recent Labs  Lab 08/30/23 1533 08/30/23 1720 08/31/23 0806 09/01/23 0748 09/02/23 0522  WBC 2.3* 1.7* 1.8* 2.1* 1.5*  NEUTROABS 1.2* 1.1*  --  1.4* 0.9*  HGB 12.7  13.0 11.8* 11.9* 11.7*  HCT 36.1 38.9 35.7* 36.3 34.7*  MCV 86.6 89.4 90.6 89.4 89.4  PLT 207 212 181 166 145*   Basic Metabolic Panel: Recent Labs  Lab 08/30/23 1720 08/31/23 0806 09/01/23 0748 09/02/23 0522  NA 121* 124* 127* 128*  K 3.6 3.8 3.8 4.1  CL 89* 94*  95* 94*  CO2 23 25 26 25   GLUCOSE 115* 94 104* 105*  BUN 10 7* 9 6*  CREATININE 0.67 0.48 0.50 0.53  CALCIUM  7.3* 7.0* 7.2* 7.4*  MG  --  2.1 2.0 1.8  PHOS  --   --  2.2* 1.5*   GFR: Estimated Creatinine Clearance: 46.6 mL/min (by C-G formula based on SCr of 0.53 mg/dL). Liver Function Tests: Recent Labs  Lab 08/30/23 1720 09/01/23 0748  AST 33 18  ALT 23 17  ALKPHOS 121 140*  BILITOT 0.9 1.2  PROT 5.9* 4.7*  ALBUMIN 2.2* 1.7*   No results for input(s): "LIPASE", "AMYLASE" in the last 168 hours. No results for input(s): "AMMONIA" in the last 168 hours. Coagulation Profile: Recent Labs  Lab 08/30/23 1720  INR 1.2   Cardiac Enzymes: No results for input(s): "CKTOTAL", "CKMB", "CKMBINDEX", "TROPONINI" in the last 168 hours. BNP (last 3 results) No results for input(s): "PROBNP" in the last 8760 hours. HbA1C: No results for input(s): "HGBA1C" in the last 72 hours. CBG: No results for input(s): "GLUCAP" in the last 168 hours. Lipid Profile: No results for input(s): "CHOL", "HDL", "LDLCALC", "TRIG", "CHOLHDL", "LDLDIRECT" in the last 72 hours. Thyroid  Function Tests: No results for input(s): "TSH", "T4TOTAL", "FREET4", "T3FREE", "THYROIDAB" in the last 72 hours. Anemia Panel: No results for input(s): "VITAMINB12", "FOLATE", "FERRITIN", "TIBC", "IRON", "RETICCTPCT" in the last 72 hours. Sepsis Labs: Recent Labs  Lab 08/30/23 1737 08/30/23 2019  LATICACIDVEN 1.3 1.1    Recent Results (from the past 240 hours)  Blood Culture (routine x 2)     Status: None (Preliminary result)   Collection Time: 08/30/23  5:20 PM   Specimen: BLOOD LEFT ARM  Result Value Ref Range Status   Specimen Description   Final    BLOOD LEFT ARM Performed at [facility], [address], [place], [place] [address]    Special Requests   Final    BOTTLES DRAWN AEROBIC AND ANAEROBIC Blood Culture adequate volume Performed at [facility], [address]  [place], [place] [address]    Culture   Final    NO GROWTH 3 DAYS Performed at [facility], [address], [place], [place] [address]    Report Status PENDING  Incomplete  Blood Culture (routine x 2)     Status: None (Preliminary result)   Collection Time: 08/30/23  6:00 PM   Specimen: BLOOD  Result Value Ref Range Status   Specimen Description   Final    BLOOD SITE NOT SPECIFIED Performed at [facility], [address], [place], [place] [address]    Special Requests   Final    BOTTLES DRAWN AEROBIC AND ANAEROBIC Blood Culture results may not be optimal due to an inadequate volume of blood received in culture bottles Performed at [facility], [address] [place], [place] [address]    Culture   Final    NO GROWTH 3 DAYS Performed at [facility], [address], [place], [place] [address]    Report Status PENDING  Incomplete  Resp panel by RT-PCR (RSV, Flu  A&B, Covid) Anterior Nasal Swab     Status: None   Collection Time: 08/30/23  6:31 PM   Specimen: Anterior Nasal Swab  Result Value Ref Range Status   SARS Coronavirus 2 by RT PCR NEGATIVE NEGATIVE Final    Comment: (NOTE) SARS-CoV-2 target nucleic acids are NOT DETECTED.  The SARS-CoV-2 RNA is generally detectable in upper respiratory specimens during the acute phase of infection. The lowest concentration of SARS-CoV-2 viral copies this assay can detect is 138 copies/mL. A negative result does not preclude SARS-Cov-2 infection and should not be used as the sole basis for treatment or other patient management decisions. A negative result may occur with  improper specimen collection/handling, submission of specimen other than nasopharyngeal swab, presence of viral mutation(s) within the areas targeted by this assay, and inadequate number of viral copies(<138 copies/mL). A negative result must be combined with clinical observations, patient history, and  epidemiological information. The expected result is Negative.  Fact Sheet for Patients:  BloggerCourse.com  Fact Sheet for Healthcare Providers:  SeriousBroker.it  This test is no t yet approved or cleared by the [place]  FDA and  has been authorized for detection and/or diagnosis of SARS-CoV-2 by FDA under an Emergency Use Authorization (EUA). This EUA will remain  in effect (meaning this test can be used) for the duration of the COVID-19 declaration under Section 564(b)(1) of the Act, 21 U.S.C.section 360bbb-3(b)(1), unless the authorization is terminated  or revoked sooner.       Influenza A by PCR NEGATIVE NEGATIVE Final   Influenza B by PCR NEGATIVE NEGATIVE Final    Comment: (NOTE) The Xpert Xpress SARS-CoV-2/FLU/RSV plus assay is intended as an aid in the diagnosis of influenza from Nasopharyngeal swab specimens and should not be used as a sole basis for treatment. Nasal washings and aspirates are unacceptable for Xpert Xpress SARS-CoV-2/FLU/RSV testing.  Fact Sheet for Patients: BloggerCourse.com  Fact Sheet for Healthcare Providers: SeriousBroker.it  This test is not yet approved or cleared by the [place]  FDA and has been authorized for detection and/or diagnosis of SARS-CoV-2 by FDA under an Emergency Use Authorization (EUA). This EUA will remain in effect (meaning this test can be used) for the duration of the COVID-19 declaration under Section 564(b)(1) of the Act, 21 U.S.C. section 360bbb-3(b)(1), unless the authorization is terminated or revoked.     Resp Syncytial Virus by PCR NEGATIVE NEGATIVE Final    Comment: (NOTE) Fact Sheet for Patients: BloggerCourse.com  Fact Sheet for Healthcare Providers: SeriousBroker.it  This test is not yet approved or cleared by the [place]  FDA and has been  authorized for detection and/or diagnosis of SARS-CoV-2 by FDA under an Emergency Use Authorization (EUA). This EUA will remain in effect (meaning this test can be used) for the duration of the COVID-19 declaration under Section 564(b)(1) of the Act, 21 U.S.C. section 360bbb-3(b)(1), unless the authorization is terminated or revoked.  Performed at [facility], [address] [place], [place] [address]   Urine Culture     Status: Abnormal   Collection Time: 08/30/23  8:00 PM   Specimen: Urine, Clean Catch  Result Value Ref Range Status   Specimen Description   Final    URINE, CLEAN CATCH Performed at [facility], [address] [place], [place] [address]    Special Requests   Final    NONE Performed at [facility], [address] [place], [place] [address]    Culture MULTIPLE SPECIES  PRESENT, SUGGEST RECOLLECTION (A)  Final   Report Status 08/31/2023 FINAL  Final         Radiology Studies: DG Abd Portable 1V Result Date: 09/01/2023 CLINICAL DATA:  Follow up small bowel obstruction EXAM: PORTABLE ABDOMEN - 1 VIEW COMPARISON:  08/31/2023 FINDINGS: Previously administered contrast now lies entirely within the colon. No persistent small bowel dilatation is seen. No free air is noted. Contrast is seen within the bladder. IMPRESSION: No evidence of small-bowel obstruction. Electronically Signed   By: Violeta Grey M.D.   On: 09/01/2023 10:17   DG Abd Portable 1V-Small Bowel Obstruction Protocol-initial, 8 hr delay Result Date: 08/31/2023 CLINICAL DATA:  SBO Protocol, 8 hr delay image EXAM: PORTABLE ABDOMEN - 1 VIEW COMPARISON:  X-ray abdomen 08/31/2023, CT abdomen pelvis 08/30/2023. FINDINGS: PO contrast opacifying the majority of the small bowel that is distended up to 2.9 cm. Likely PO contrast within the ascending colon. No radio-opaque calculi or other significant radiographic abnormality are seen. IMPRESSION: PO contrast  opacifying the majority of the small bowel that is distended up to 2.9 cm. Likely PO contrast within the ascending colon. Recommend attention on follow-up. Electronically Signed   By: Morgane  Naveau M.D.   On: 08/31/2023 19:31        Scheduled Meds:  feeding supplement  1 Container Oral TID BM   [name]'s butt cream   Topical TID   ondansetron  (ZOFRAN ) IV  4 mg Intravenous Q6H   pantoprazole  (PROTONIX ) IV  40 mg Intravenous Q24H   Ensure Max Protein  11 oz Oral Daily   silver  sulfADIAZINE   1 Application Topical BID   Continuous Infusions:  lactated ringers      piperacillin -tazobactam (ZOSYN )  IV 3.375 g (09/02/23 0958)   sodium phosphate  45 mmol in sodium chloride  0.9 % 250 mL infusion 45 mmol (09/02/23 1330)     LOS: 3 days    Time spent: 52 minutes    Vada Garibaldi, MD [facility]

## 2023-09-03 ENCOUNTER — Other Ambulatory Visit

## 2023-09-03 ENCOUNTER — Other Ambulatory Visit (HOSPITAL_COMMUNITY)

## 2023-09-03 DIAGNOSIS — Z7189 Other specified counseling: Secondary | ICD-10-CM

## 2023-09-03 DIAGNOSIS — Z515 Encounter for palliative care: Secondary | ICD-10-CM

## 2023-09-03 DIAGNOSIS — R110 Nausea: Secondary | ICD-10-CM

## 2023-09-03 DIAGNOSIS — K529 Noninfective gastroenteritis and colitis, unspecified: Secondary | ICD-10-CM | POA: Diagnosis not present

## 2023-09-03 DIAGNOSIS — R630 Anorexia: Secondary | ICD-10-CM

## 2023-09-03 LAB — C DIFFICILE (CDIFF) QUICK SCRN (NO PCR REFLEX)
C Diff antigen: NEGATIVE
C Diff interpretation: NOT DETECTED
C Diff toxin: NEGATIVE

## 2023-09-03 MED ORDER — SODIUM CHLORIDE 0.9% FLUSH
10.0000 mL | Freq: Two times a day (BID) | INTRAVENOUS | Status: DC
  Administered 2023-09-03 – 2023-09-05 (×4): 10 mL

## 2023-09-03 MED ORDER — LEVOFLOXACIN 500 MG PO TABS
750.0000 mg | ORAL_TABLET | ORAL | Status: DC
  Administered 2023-09-03: 750 mg via ORAL
  Filled 2023-09-03: qty 2

## 2023-09-03 MED ORDER — DEXAMETHASONE SODIUM PHOSPHATE 4 MG/ML IJ SOLN
4.0000 mg | Freq: Every day | INTRAMUSCULAR | Status: AC
  Administered 2023-09-03 – 2023-09-05 (×3): 4 mg via INTRAVENOUS
  Filled 2023-09-03 (×3): qty 1

## 2023-09-03 MED ORDER — SODIUM CHLORIDE 0.9% FLUSH: 10.0000 mL | INTRAVENOUS | Status: DC | PRN

## 2023-09-03 MED ORDER — LACTATED RINGERS IV SOLN: INTRAVENOUS | Status: DC

## 2023-09-03 NOTE — Consult Note (Signed)
 Palliative Care Consult Note                                  Date: 09/03/2023   Patient Name: Rebecca [name]  DOB: [birth year]  MRN: [id number]  Age / Sex: 73 y.o., female  PCP: Ngetich, Elijio Guadeloupe, NP Referring Physician: Vada Garibaldi, MD  Reason for Consultation: Establishing goals of care, symptom management  HPI/Patient Profile: 73 y.o. female  with past medical history of anal cancer who completed her final dose of radiation 5/29 and her final dose of chemotherapy on 5/19. She went for an unscheduled visit to the oncology clinic on 08/30/23 reporting to have fever of 100.6, significant skin breakdown to the rectum, and weakness, so was sent to the ED. Found to have WBC count of 1.7 and is admitted with neutropenic fever, inflammatory versus infectious enteritis, and partial small bowel obstruction.  Palliative Medicine has been consulted for goals of care discussions and symptom management.   Clinical Assessment and Goals of Care:   Extensive chart review has been completed including labs, vital signs, imaging, progress/consult notes, orders, medications and available advance directive documents.    I met with patient and her daughter/Rebecca [name] at bedside to discuss diagnosis, prognosis, GOC, and symptom management.  I introduced Palliative Medicine as specialized medical care for people living with serious illness. It focuses on providing relief from the symptoms and stress of a serious illness.   Created space and opportunity for patient and family to express thoughts and feelings regarding current medical situation. Values and goals of care were attempted to be elicited.  Discussion: We discussed patient's current illness and what it means in the larger context of her ongoing co-morbidities. Current clinical status was reviewed.   Patient states she has encountered a "steady stream of difficulty" related to chemotherapy and radiation, and  expresses relief that treatment is complete.   A brief life review was discussed. Patient is a retired Optician, dispensing. Social support includes her husband and daughter. She shares that prior treatment, she was quite active. She enjoyed volunteering, walking around the block, and gardening.  Most of today's discussion focused on symptom management. Patient reports multiple symptoms including nausea (currently controlled on scheduled zofran ), poor appetite, weakness, skin breakdown, and diarrhea.   Patient states her goal is to maximize her quality of life at home. She is hopeful to increase her oral intake, be able to get to the bathroom without incontinence, and to be able to walk around the block again.   I discussed with patient and daughter my recommendation to start steroids, which will decrease inflammatory symptom as well as hopefully help stimulate appetite.  Discussed the importance of continued conversation with the medical team regarding overall plan of care and treatment options, ensuring decisions are within the context of the patients values and GOCs.  Questions and concerns addressed. Patient/family encouraged to call with questions or concerns. Plan for follow-up tomorrow.    Review of Systems  Constitutional:  Positive for appetite change.  Gastrointestinal:  Positive for diarrhea and nausea.    Objective:   Primary Diagnoses: Present on Admission:  Enteritis   Physical Exam Vitals reviewed.  Constitutional:      General: She is not in acute distress.    Comments: Frail and chronically ill-appearing  Pulmonary:     Effort: Pulmonary effort is normal.  Neurological:     Mental Status: She is alert  and oriented to person, place, and time.  Psychiatric:        Behavior: Behavior normal.     Comments: Pleasant affect     Palliative Assessment/Data: PPS 40%     Assessment & Plan:   SUMMARY OF RECOMMENDATIONS   Continue current supportive interventions Goal of  care is medical stabilization and improvement in symptoms, so she can return home PMT will follow-up tomorrow  Symptom Management:  Start dexamethasone  4 mg IV x 3 days Continue scheduled zofran  every 6 hours Continue oxycodone  IR 5 mg every 6 hours as needed for pain (none required in the past 48 hours) Continue [name]'s butt cream for skin breakdown  Primary Decision Maker: PATIENT  Existing Vynca/ACP Documentation: HCPOA document designating husband/Rebecca [name] as primary health care agent and daughter/Rebecca [name] as alternate  Code Status/Advance Care Planning: DNR - Limited  Prognosis:  Unable to determine  Discharge Planning:  To Be Determined    Thank you for allowing us  to participate in the care of Rebecca [name]   Time Total: 76 minutes  Detailed review of medical records (labs, imaging, vital signs), medically appropriate exam, discussed with treatment team, counseling and education to patient, family, & staff, documenting clinical information, medication management, coordination of care.   Signed by: Maisie Scotland, NP Palliative Medicine Team  Team Phone # [phone]  For individual providers, please see AMION

## 2023-09-03 NOTE — Progress Notes (Signed)

## 2023-09-03 NOTE — Progress Notes (Signed)
   Subjective/Chief Complaint: Feeling a little better. Reports increased flatus and ongoing diarrhea. Nausea improved with zofran  being scheduled.   Objective: Vital signs in last 24 hours: Temp:  [98 F (36.7 C)-98.4 F (36.9 C)] 98.4 F (36.9 C) (06/03 1258) Pulse Rate:  [94-97] 94 (06/03 1258) Resp:  [16-17] 17 (06/03 1258) BP: (127-132)/(64-69) 127/64 (06/03 1258) SpO2:  [94 %-99 %] 99 % (06/03 1258) Last BM Date : 09/02/23  Intake/Output from previous [place]: 06/02 0701 - 06/03 0700 In: 587.2 [P.O.:420; IV Piggyback:167.2] Out: -  Intake/Output this shift: No intake/output data recorded.  Ab mild to moderate distention nontender GU: there is erythema and skin sloughing of the mons/groin consistent with history of pelvic radiation  Lab Results:  Recent Labs    09/01/23 0748 09/02/23 0522  WBC 2.1* 1.5*  HGB 11.9* 11.7*  HCT 36.3 34.7*  PLT 166 145*   BMET Recent Labs    09/01/23 0748 09/02/23 0522  NA 127* 128*  K 3.8 4.1  CL 95* 94*  CO2 26 25  GLUCOSE 104* 105*  BUN 9 6*  CREATININE 0.50 0.53  CALCIUM  7.2* 7.4*   PT/INR No results for input(s): "LABPROT", "INR" in the last 72 hours.  ABG No results for input(s): "PHART", "HCO3" in the last 72 hours.  Invalid input(s): "PCO2", "PO2"  Studies/Results: No results found.   Anti-infectives: Anti-infectives (From admission, onward)    Start     Dose/Rate Route Frequency Ordered Stop   09/03/23 2000  levofloxacin (LEVAQUIN) tablet 750 mg        750 mg Oral Every 48 hours 09/03/23 1528 09/07/23 1959   08/31/23 0200  piperacillin -tazobactam (ZOSYN ) IVPB 3.375 g  Status:  Discontinued        3.375 g 12.5 mL/hr over 240 Minutes Intravenous Every 8 hours 08/30/23 2224 09/03/23 1528   08/31/23 0000  piperacillin -tazobactam (ZOSYN ) IVPB 3.375 g  Status:  Discontinued        3.375 g 100 mL/hr over 30 Minutes Intravenous  Once 08/30/23 2222 08/30/23 2223   08/30/23 2230  piperacillin -tazobactam (ZOSYN )  IVPB 3.375 g  Status:  Discontinued        3.375 g 100 mL/hr over 30 Minutes Intravenous  Once 08/30/23 2222 08/30/23 2222   08/30/23 1715  piperacillin -tazobactam (ZOSYN ) IVPB 3.375 g        3.375 g 100 mL/hr over 30 Minutes Intravenous  Once 08/30/23 1705 08/30/23 1942   08/30/23 1715  vancomycin  (VANCOCIN ) IVPB 1000 mg/200 mL premix        1,000 mg 200 mL/hr over 60 Minutes Intravenous  Once 08/30/23 1709 08/30/23 1942       Assessment/Plan: pSBO, enteritis in the setting of anal cancer undergoing chemoradiation - continue attempted non-operative management. -film 6/1 shows contrast in the colon and interval decrease in small bowel dilation.  - slow clinical improvement in obstructive symptoms - advance to FLD - I reached out to [facility] RN regarding assistance with perineal skin sloughing in the setting of pelvic radiation. They recommended gerhardt's butt cream/barrier cream, which I ordered. - discussed plan with patient and daughter   I reviewed hospitalist notes, last 24 h vitals and pain scores, last 48 h intake and output, last 24 h labs and trends, and last 24 h imaging results.   Rebecca [name] 09/03/2023   Xray with contrast in colon, fine to continue clears and abx

## 2023-09-03 NOTE — Progress Notes (Signed)
 Mobility Specialist - Progress Note   09/03/23 1055  Mobility  Activity Ambulated with assistance in hallway  Level of Assistance Modified independent, requires aide device or extra time  Assistive Device Front wheel walker  Distance Ambulated (ft) 500 ft  Activity Response Tolerated well  Mobility Referral Yes  Mobility visit 1 Mobility  Mobility Specialist Start Time (ACUTE ONLY) 1041  Mobility Specialist Stop Time (ACUTE ONLY) 1054  Mobility Specialist Time Calculation (min) (ACUTE ONLY) 13 min   Pt received in bed and agreeable to mobility. No complaints during session. Pt to bed after session with all needs met.    [facility]

## 2023-09-03 NOTE — Progress Notes (Signed)
 PROGRESS NOTE    Doneshia Hill Mccaster  UJW:[id number] DOB: 06-26-1950 DOA: 08/30/2023 PCP: Estil Heman, NP    Brief Narrative:  73 year old female with history of anal cancer completed last dose of chemotherapy last week, radiation therapy 1 [place] prior to arrival presented to infusion center with low-grade temperature, abdominal pain and reported diarrhea for a long time.  Hemodynamically stable.  Sent to ER.  Neutropenic with white cell count of 1.7 and temperature 100.7.  CT scan with inflammatory versus infectious enteritis with possible transition point but no complete small bowel obstruction.  Admitted with symptomatic management.  Oncology and surgery following. Remains in the hospital with intolerance to oral intake.  Subjective: Patient seen and examined.  Much improvement and symptom control today.  She is tolerating at least some applesauce and a small amount of clear liquids.  Denies any nausea since at least last 24 hours she is on a scheduled Zofran .  Patient started having multiple loose bowel movements today, C. difficile negative. Mild abdominal discomfort present but denies any distention.   Assessment & Plan:   Neutropenic fever with inflammatory versus infectious enteritis: Partial small bowel obstruction/ileus secondary to above.  Tolerating clears, will advance to full liquid diet today.  Very slow and gradual advancement of diet.   Neutropenic precautions.  Blood cultures are negative.  Urine culture with multiple species that is likely contamination. Will continue Zosyn  until her GI symptoms improved. This is possibly chemotherapy effect, oncology will be following. Seen by surgery.  Anticipating conservative management.  Hypochloremic hyponatremia: Likely related to fluid loss.  Sodium 121-124-127-128 and gradually responding. Currently on isotonic fluid that we will continue.  Slow correction recommended.  Recheck levels tomorrow morning.  Hypophosphatemia: Replaced.   Recheck tomorrow.  Anal cancer: As above.  Oncology following.  Currently completed therapies.  Hyperlipidemia: Continue statin on discharge. Holding due to poor oral intake.  Severe protein calorie malnutrition: Consult nutrition.  Perineal skin excoriation: Wound care as per surgical recommendations.  Tachycardia: Stabilizing.  Check echocardiogram to rule out any cardiomyopathy caused by chemotherapy.   DVT prophylaxis: SCDs Start: 08/30/23 2219   Code Status: DNR Family Communication: Husband and daughter at the bedside.  Patient and family requested palliative care follow-up to help them manage symptoms at home. Disposition Plan: Status is: Inpatient Remains inpatient appropriate because: Symptomatic, challenging oral intake.     Consultants:  General Surgery Oncology Palliative care  Procedures:  None  Antimicrobials:  IV Zosyn  5/30---     Objective: Vitals:   09/01/23 2128 09/02/23 0531 09/02/23 1326 09/03/23 0506  BP: 109/83 131/62 129/63 132/69  Pulse: 97 (!) 102 (!) 102 97  Resp: 18 18 18 16   Temp: (!) 97.5 F (36.4 C) 98.3 F (36.8 C) 97.6 F (36.4 C) 98 F (36.7 C)  TempSrc: Oral Oral Oral Oral  SpO2: 96% 94% 97% 94%  Weight:      Height:        Intake/Output Summary (Last 24 hours) at 09/03/2023 1115 Last data filed at 09/02/2023 1553 Gross per 24 hour  Intake 167.17 ml  Output --  Net 167.17 ml   Filed Weights   08/30/23 1701 08/30/23 1816  Weight: 45.8 kg 45.8 kg    Examination:  General: Frail.  Looks fairly comfortable today.  Alert interactive and pleasant. Cardiovascular: S1-S2 normal.  Regular rate rhythm. Respiratory: Bilateral clear.  No added sounds. Gastrointestinal: Soft.  Mildly distended.  Nontender..  No rigidity or guarding.  Bowel sounds present.  Ext: No swelling or edema.  No cyanosis. Neuro: Alert awake and oriented. Musculoskeletal: No deformities. Skin: Skin excoriations along the perineum secondary to  radiation.      Data Reviewed: I have personally reviewed following labs and imaging studies  CBC: Recent Labs  Lab 08/30/23 1533 08/30/23 1720 08/31/23 0806 09/01/23 0748 09/02/23 0522  WBC 2.3* 1.7* 1.8* 2.1* 1.5*  NEUTROABS 1.2* 1.1*  --  1.4* 0.9*  HGB 12.7 13.0 11.8* 11.9* 11.7*  HCT 36.1 38.9 35.7* 36.3 34.7*  MCV 86.6 89.4 90.6 89.4 89.4  PLT 207 212 181 166 145*   Basic Metabolic Panel: Recent Labs  Lab 08/30/23 1720 08/31/23 0806 09/01/23 0748 09/02/23 0522  NA 121* 124* 127* 128*  K 3.6 3.8 3.8 4.1  CL 89* 94* 95* 94*  CO2 23 25 26 25   GLUCOSE 115* 94 104* 105*  BUN 10 7* 9 6*  CREATININE 0.67 0.48 0.50 0.53  CALCIUM  7.3* 7.0* 7.2* 7.4*  MG  --  2.1 2.0 1.8  PHOS  --   --  2.2* 1.5*   GFR: Estimated Creatinine Clearance: 46.6 mL/min (by C-G formula based on SCr of 0.53 mg/dL). Liver Function Tests: Recent Labs  Lab 08/30/23 1720 09/01/23 0748  AST 33 18  ALT 23 17  ALKPHOS 121 140*  BILITOT 0.9 1.2  PROT 5.9* 4.7*  ALBUMIN 2.2* 1.7*   No results for input(s): "LIPASE", "AMYLASE" in the last 168 hours. No results for input(s): "AMMONIA" in the last 168 hours. Coagulation Profile: Recent Labs  Lab 08/30/23 1720  INR 1.2   Cardiac Enzymes: No results for input(s): "CKTOTAL", "CKMB", "CKMBINDEX", "TROPONINI" in the last 168 hours. BNP (last 3 results) No results for input(s): "PROBNP" in the last 8760 hours. HbA1C: No results for input(s): "HGBA1C" in the last 72 hours. CBG: No results for input(s): "GLUCAP" in the last 168 hours. Lipid Profile: No results for input(s): "CHOL", "HDL", "LDLCALC", "TRIG", "CHOLHDL", "LDLDIRECT" in the last 72 hours. Thyroid  Function Tests: No results for input(s): "TSH", "T4TOTAL", "FREET4", "T3FREE", "THYROIDAB" in the last 72 hours. Anemia Panel: No results for input(s): "VITAMINB12", "FOLATE", "FERRITIN", "TIBC", "IRON", "RETICCTPCT" in the last 72 hours. Sepsis Labs: Recent Labs  Lab  08/30/23 1737 08/30/23 2019  LATICACIDVEN 1.3 1.1    Recent Results (from the past 240 hours)  Blood Culture (routine x 2)     Status: None (Preliminary result)   Collection Time: 08/30/23  5:20 PM   Specimen: BLOOD LEFT ARM  Result Value Ref Range Status   Specimen Description   Final    BLOOD LEFT ARM Performed at [facility], [address] [place], [place] [address]    Special Requests   Final    BOTTLES DRAWN AEROBIC AND ANAEROBIC Blood Culture adequate volume Performed at [facility], [address], [place], [place] [address]    Culture   Final    NO GROWTH 4 DAYS Performed at [facility], [address], [place], [place] [address]    Report Status PENDING  Incomplete  Blood Culture (routine x 2)     Status: None (Preliminary result)   Collection Time: 08/30/23  6:00 PM   Specimen: BLOOD  Result Value Ref Range Status   Specimen Description   Final    BLOOD SITE NOT SPECIFIED Performed at [facility], [address], [place], [place] [address]    Special Requests   Final    BOTTLES DRAWN  AEROBIC AND ANAEROBIC Blood Culture results may not be optimal due to an inadequate volume of blood received in culture bottles Performed at [facility], [address], [place], [place] [address]    Culture   Final    NO GROWTH 4 DAYS Performed at [facility], [address] [place], [place] [address]    Report Status PENDING  Incomplete  Resp panel by RT-PCR (RSV, Flu A&B, Covid) Anterior Nasal Swab     Status: None   Collection Time: 08/30/23  6:31 PM   Specimen: Anterior Nasal Swab  Result Value Ref Range Status   SARS Coronavirus 2 by RT PCR NEGATIVE NEGATIVE Final    Comment: (NOTE) SARS-CoV-2 target nucleic acids are NOT DETECTED.  The SARS-CoV-2 RNA is generally detectable in upper respiratory specimens during the acute phase of infection. The lowest concentration of SARS-CoV-2 viral  copies this assay can detect is 138 copies/mL. A negative result does not preclude SARS-Cov-2 infection and should not be used as the sole basis for treatment or other patient management decisions. A negative result may occur with  improper specimen collection/handling, submission of specimen other than nasopharyngeal swab, presence of viral mutation(s) within the areas targeted by this assay, and inadequate number of viral copies(<138 copies/mL). A negative result must be combined with clinical observations, patient history, and epidemiological information. The expected result is Negative.  Fact Sheet for Patients:  BloggerCourse.com  Fact Sheet for Healthcare Providers:  SeriousBroker.it  This test is no t yet approved or cleared by the [place]  FDA and  has been authorized for detection and/or diagnosis of SARS-CoV-2 by FDA under an Emergency Use Authorization (EUA). This EUA will remain  in effect (meaning this test can be used) for the duration of the COVID-19 declaration under Section 564(b)(1) of the Act, 21 U.S.C.section 360bbb-3(b)(1), unless the authorization is terminated  or revoked sooner.       Influenza A by PCR NEGATIVE NEGATIVE Final   Influenza B by PCR NEGATIVE NEGATIVE Final    Comment: (NOTE) The Xpert Xpress SARS-CoV-2/FLU/RSV plus assay is intended as an aid in the diagnosis of influenza from Nasopharyngeal swab specimens and should not be used as a sole basis for treatment. Nasal washings and aspirates are unacceptable for Xpert Xpress SARS-CoV-2/FLU/RSV testing.  Fact Sheet for Patients: BloggerCourse.com  Fact Sheet for Healthcare Providers: SeriousBroker.it  This test is not yet approved or cleared by the [place]  FDA and has been authorized for detection and/or diagnosis of SARS-CoV-2 by FDA under an Emergency Use Authorization (EUA). This  EUA will remain in effect (meaning this test can be used) for the duration of the COVID-19 declaration under Section 564(b)(1) of the Act, 21 U.S.C. section 360bbb-3(b)(1), unless the authorization is terminated or revoked.     Resp Syncytial Virus by PCR NEGATIVE NEGATIVE Final    Comment: (NOTE) Fact Sheet for Patients: BloggerCourse.com  Fact Sheet for Healthcare Providers: SeriousBroker.it  This test is not yet approved or cleared by the [place]  FDA and has been authorized for detection and/or diagnosis of SARS-CoV-2 by FDA under an Emergency Use Authorization (EUA). This EUA will remain in effect (meaning this test can be used) for the duration of the COVID-19 declaration under Section 564(b)(1) of the Act, 21 U.S.C. section 360bbb-3(b)(1), unless the authorization is terminated or revoked.  Performed at [facility], [address] [place], [place] [address]   Urine Culture     Status: Abnormal  Collection Time: 08/30/23  8:00 PM   Specimen: Urine, Clean Catch  Result Value Ref Range Status   Specimen Description   Final    URINE, CLEAN CATCH Performed at [facility], [address] [place], [place], [place] [address]    Special Requests   Final    NONE Performed at [facility], [address] [place], [place] [address]    Culture MULTIPLE SPECIES PRESENT, SUGGEST RECOLLECTION (A)  Final   Report Status 08/31/2023 FINAL  Final  C Difficile Quick Screen (NO PCR Reflex)     Status: None   Collection Time: 09/03/23  7:37 AM   Specimen: STOOL  Result Value Ref Range Status   C Diff antigen NEGATIVE NEGATIVE Final   C Diff toxin NEGATIVE NEGATIVE Final   C Diff interpretation No C. difficile detected.  Final    Comment: Performed at [facility], [address], [place], [place] [address]         Radiology Studies: No results  found.       Scheduled Meds:  feeding supplement  1 Container Oral TID BM   [name]'s butt cream   Topical TID   ondansetron  (ZOFRAN ) IV  4 mg Intravenous Q6H   pantoprazole  (PROTONIX ) IV  40 mg Intravenous Q24H   Ensure Max Protein  11 oz Oral Daily   silver  sulfADIAZINE   1 Application Topical BID   Continuous Infusions:  lactated ringers  100 mL/hr at 09/03/23 0031   piperacillin -tazobactam (ZOSYN )  IV 3.375 g (09/03/23 0138)     LOS: 4 days    Time spent: 50 minutes    Vada Garibaldi, MD [facility]

## 2023-09-03 NOTE — Plan of Care (Signed)

## 2023-09-04 ENCOUNTER — Inpatient Hospital Stay (HOSPITAL_COMMUNITY)

## 2023-09-04 ENCOUNTER — Encounter: Payer: Self-pay | Admitting: General Practice

## 2023-09-04 DIAGNOSIS — K529 Noninfective gastroenteritis and colitis, unspecified: Secondary | ICD-10-CM | POA: Diagnosis not present

## 2023-09-04 DIAGNOSIS — E43 Unspecified severe protein-calorie malnutrition: Secondary | ICD-10-CM | POA: Insufficient documentation

## 2023-09-04 DIAGNOSIS — R9431 Abnormal electrocardiogram [ECG] [EKG]: Secondary | ICD-10-CM

## 2023-09-04 DIAGNOSIS — K56609 Unspecified intestinal obstruction, unspecified as to partial versus complete obstruction: Secondary | ICD-10-CM

## 2023-09-04 LAB — CBC WITH DIFFERENTIAL/PLATELET
Abs Immature Granulocytes: 0.01 10*3/uL (ref 0.00–0.07)
Basophils Absolute: 0 10*3/uL (ref 0.0–0.1)
Basophils Relative: 1 %
Eosinophils Absolute: 0 10*3/uL (ref 0.0–0.5)
Eosinophils Relative: 0 %
HCT: 35.3 % — ABNORMAL LOW (ref 36.0–46.0)
Hemoglobin: 11.5 g/dL — ABNORMAL LOW (ref 12.0–15.0)
Immature Granulocytes: 0 %
Lymphocytes Relative: 30 %
Lymphs Abs: 0.7 10*3/uL (ref 0.7–4.0)
MCH: 29.3 pg (ref 26.0–34.0)
MCHC: 32.6 g/dL (ref 30.0–36.0)
MCV: 89.8 fL (ref 80.0–100.0)
Monocytes Absolute: 0.5 10*3/uL (ref 0.1–1.0)
Monocytes Relative: 22 %
Neutro Abs: 1.1 10*3/uL — ABNORMAL LOW (ref 1.7–7.7)
Neutrophils Relative %: 47 %
Platelets: 97 10*3/uL — ABNORMAL LOW (ref 150–400)
RBC: 3.93 MIL/uL (ref 3.87–5.11)
RDW: 15 % (ref 11.5–15.5)
WBC: 2.3 10*3/uL — ABNORMAL LOW (ref 4.0–10.5)
nRBC: 0 % (ref 0.0–0.2)

## 2023-09-04 LAB — GASTROINTESTINAL PANEL BY PCR, STOOL (REPLACES STOOL CULTURE)

## 2023-09-04 LAB — COMPREHENSIVE METABOLIC PANEL WITH GFR
ALT: 28 U/L (ref 0–44)
AST: 37 U/L (ref 15–41)
Albumin: 1.7 g/dL — ABNORMAL LOW (ref 3.5–5.0)
Alkaline Phosphatase: 427 U/L — ABNORMAL HIGH (ref 38–126)
Anion gap: 10 (ref 5–15)
BUN: 9 mg/dL (ref 8–23)
CO2: 23 mmol/L (ref 22–32)
Calcium: 7.5 mg/dL — ABNORMAL LOW (ref 8.9–10.3)
Chloride: 97 mmol/L — ABNORMAL LOW (ref 98–111)
Creatinine, Ser: 0.57 mg/dL (ref 0.44–1.00)
GFR, Estimated: >60 mL/min (ref >60)
Glucose, Bld: 136 mg/dL — ABNORMAL HIGH (ref 70–99)
Potassium: 3.9 mmol/L (ref 3.5–5.1)
Sodium: 130 mmol/L — ABNORMAL LOW (ref 135–145)
Total Bilirubin: 0.9 mg/dL (ref 0.0–1.2)
Total Protein: 4.6 g/dL — ABNORMAL LOW (ref 6.5–8.1)

## 2023-09-04 LAB — ECHOCARDIOGRAM COMPLETE
AR max vel: 1.64 cm2
AV Area VTI: 1.54 cm2
AV Area mean vel: 1.41 cm2
AV Mean grad: 2 mmHg
AV Peak grad: 4.9 mmHg
Ao pk vel: 1.11 m/s
Area-P 1/2: 3.63 cm2
Calc EF: 64.6 %
Height: 64 in
S' Lateral: 2.2 cm
Single Plane A2C EF: 61.9 %
Single Plane A4C EF: 68.8 %
Weight: 1616 [oz_av]

## 2023-09-04 LAB — CULTURE, BLOOD (ROUTINE X 2)
Culture: NO GROWTH
Culture: NO GROWTH
Special Requests: ADEQUATE

## 2023-09-04 LAB — MAGNESIUM: Magnesium: 1.9 mg/dL (ref 1.7–2.4)

## 2023-09-04 LAB — PHOSPHORUS: Phosphorus: 2.4 mg/dL — ABNORMAL LOW (ref 2.5–4.6)

## 2023-09-04 MED ORDER — ONDANSETRON 4 MG PO TBDP
4.0000 mg | ORAL_TABLET | Freq: Four times a day (QID) | ORAL | Status: DC
  Administered 2023-09-04 – 2023-09-05 (×3): 4 mg via ORAL
  Filled 2023-09-04 (×3): qty 1

## 2023-09-04 MED ORDER — ENSURE MAX PROTEIN PO LIQD
11.0000 [oz_av] | Freq: Two times a day (BID) | ORAL | Status: DC
  Filled 2023-09-04 (×2): qty 330

## 2023-09-04 MED ORDER — K PHOS MONO-SOD PHOS DI & MONO 155-852-130 MG PO TABS
250.0000 mg | ORAL_TABLET | Freq: Three times a day (TID) | ORAL | Status: DC
  Administered 2023-09-04 – 2023-09-05 (×3): 250 mg via ORAL
  Filled 2023-09-04 (×3): qty 1

## 2023-09-04 MED ORDER — PANTOPRAZOLE SODIUM 40 MG PO TBEC
40.0000 mg | DELAYED_RELEASE_TABLET | Freq: Every day | ORAL | Status: DC
  Administered 2023-09-05: 40 mg via ORAL
  Filled 2023-09-04: qty 1

## 2023-09-04 NOTE — Progress Notes (Signed)
 Subjective/Chief Complaint: Doing better - reports a semi-solid stool along with flatus. She just had cheerios with milk, yogurt, and couple of blueberries. She thinks she may have overdone it with PO intake but is overall feeling better. Denies nausea  Patient tells me she will be meeting with palliative care today to discuss symptom management.  Objective: Vital signs in last 24 hours: Temp:  [97.7 F (36.5 C)-98.4 F (36.9 C)] 97.8 F (36.6 C) (06/04 0617) Pulse Rate:  [77-111] 111 (06/04 0617) Resp:  [17-18] 18 (06/04 0617) BP: (127-137)/(64-77) 131/70 (06/04 0617) SpO2:  [97 %-99 %] 97 % (06/04 0617) Last BM Date : 09/03/23  Intake/Output from previous [place]: 06/03 0701 - 06/04 0700 In: 800 [I.V.:800] Out: -  Intake/Output this shift: Total I/O In: 10 [I.V.:10] Out: -   Ab mild distention (improved compared to prior) nontender GU: there is erythema and skin sloughing of the mons/groin consistent with history of pelvic radiation  Lab Results:  Recent Labs    09/02/23 0522  WBC 1.5*  HGB 11.7*  HCT 34.7*  PLT 145*   BMET Recent Labs    09/02/23 0522  NA 128*  K 4.1  CL 94*  CO2 25  GLUCOSE 105*  BUN 6*  CREATININE 0.53  CALCIUM  7.4*   PT/INR No results for input(s): "LABPROT", "INR" in the last 72 hours.  ABG No results for input(s): "PHART", "HCO3" in the last 72 hours.  Invalid input(s): "PCO2", "PO2"  Studies/Results: No results found.   Anti-infectives: Anti-infectives (From admission, onward)    Start     Dose/Rate Route Frequency Ordered Stop   09/03/23 2000  levofloxacin (LEVAQUIN) tablet 750 mg        750 mg Oral Every 48 hours 09/03/23 1528 09/07/23 1959   08/31/23 0200  piperacillin -tazobactam (ZOSYN ) IVPB 3.375 g  Status:  Discontinued        3.375 g 12.5 mL/hr over 240 Minutes Intravenous Every 8 hours 08/30/23 2224 09/03/23 1528   08/31/23 0000  piperacillin -tazobactam (ZOSYN ) IVPB 3.375 g  Status:  Discontinued        3.375  g 100 mL/hr over 30 Minutes Intravenous  Once 08/30/23 2222 08/30/23 2223   08/30/23 2230  piperacillin -tazobactam (ZOSYN ) IVPB 3.375 g  Status:  Discontinued        3.375 g 100 mL/hr over 30 Minutes Intravenous  Once 08/30/23 2222 08/30/23 2222   08/30/23 1715  piperacillin -tazobactam (ZOSYN ) IVPB 3.375 g        3.375 g 100 mL/hr over 30 Minutes Intravenous  Once 08/30/23 1705 08/30/23 1942   08/30/23 1715  vancomycin  (VANCOCIN ) IVPB 1000 mg/200 mL premix        1,000 mg 200 mL/hr over 60 Minutes Intravenous  Once 08/30/23 1709 08/30/23 1942       Assessment/Plan: pSBO, enteritis in the setting of anal cancer undergoing chemoradiation - continue attempted non-operative management. -film 6/1 shows contrast in the colon and interval decrease in small bowel dilation.  - slow clinical improvement in obstructive symptoms - I reached out to [facility] RN regarding assistance with perineal skin sloughing in the setting of pelvic radiation. They recommended gerhardt's butt cream/barrier cream, which I ordered. - discussed plan with patient, husband, and daughter  -has been advanced to a soft diet. Discussed with the patient that it is also reasonable to continue a mostly full liquid diet with ensure and yogurt until she better tolerates solid foods. I advised her not to take anti-diarrheal medications in the  setting of pSBO.  Her bowel obstruction is improving with non-operative measures. General surgery will sign off. Please call us  back if needed.   I reviewed hospitalist notes, last 24 h vitals and pain scores, last 48 h intake and output, last 24 h labs and trends, and last 24 h imaging results.   Rebecca [name] 09/04/2023   Xray with contrast in colon, fine to continue clears and abx

## 2023-09-04 NOTE — Plan of Care (Signed)

## 2023-09-04 NOTE — Progress Notes (Signed)
                                                                                                                                                                                                          Palliative Medicine Progress Note   Patient Name: Rebecca [name]       Date: 09/04/2023 DOB: 1950-06-17  Age: 73 y.o. MRN#: [id number] Attending Physician: Vada Garibaldi, MD Primary Care Physician: Estil Heman, NP Admit Date: 08/30/2023  Reason for Consultation/Follow-up: {Reason for Consult:23484}  HPI/Patient Profile: 73 y.o. female  with past medical history of anal cancer who completed her final dose of radiation 5/29 and her final dose of chemotherapy on 5/19. She went for an unscheduled visit to the oncology clinic on 08/30/23 reporting to have fever of 100.6, significant skin breakdown to the rectum, and weakness, so was sent to the ED. Found to have WBC count of 1.7 and is admitted with neutropenic fever, inflammatory versus infectious enteritis, and partial small bowel obstruction.   Palliative Medicine has been consulted for goals of care discussions and symptom management.   Subjective: Chart reviewed. Update received from RN.   Bedside visit with patient, husband, and daughter. Patient reports feeling much better than yesterday.   Objective:  Physical Exam Vitals reviewed.  Constitutional:      General: She is not in acute distress.    Appearance: She is ill-appearing.  Pulmonary:     Effort: Pulmonary effort is normal.  Neurological:     Mental Status: She is alert and oriented to person, place, and time.  Psychiatric:     Comments: pleasant             Palliative Medicine Assessment & Plan   Assessment: Principal Problem:   Enteritis Active Problems:   Protein-calorie malnutrition, severe    Recommendations/Plan: Referral made to outpatient palliative care clinic at [facility] Rx for dexamethasone  at discharge:   Goals of Care and Additional  Recommendations: Limitations on Scope of Treatment: {Recommended Scope and Preferences:21019}  Code Status: DNR - Limited   Prognosis:  {Palliative Care Prognosis:23504}  Discharge Planning: {Palliative dispostion:23505}  Care plan was discussed with ***  Thank you for allowing the Palliative Medicine Team to assist in the care of this patient.   ***   Wynetta Heckle, NP   Please contact Palliative Medicine Team phone at [phone] for questions and concerns.  For individual providers, please see AMION.

## 2023-09-04 NOTE — Progress Notes (Signed)
 Initial Nutrition Assessment  DOCUMENTATION CODES:   Severe malnutrition in context of chronic illness, Underweight  INTERVENTION:   -Ensure MAX Protein po BID, each supplement provides 150 kcal and 30 grams of protein mixed with ice cream to increase kcals.  -Placed "High Calorie, High Protein" handout in AVS and discussed with pt and family at bedside.  -D/c Boost Breeze as pt does not like this   NUTRITION DIAGNOSIS:   Severe Malnutrition related to chronic illness, cancer and cancer related treatments as evidenced by energy intake < or equal to 75% for > or equal to 1 month, severe muscle depletion, moderate fat depletion.  GOAL:   Patient will meet greater than or equal to 90% of their needs  MONITOR:   PO intake, Supplement acceptance  REASON FOR ASSESSMENT:   Consult Assessment of nutrition requirement/status  ASSESSMENT:   73 year old female with history of anal cancer completed last dose of chemotherapy last week, radiation therapy 1 [place] prior to arrival presented to infusion center with low-grade temperature, abdominal pain and reported diarrhea for a long time. CT scan with inflammatory versus infectious enteritis with possible transition point but no complete small bowel obstruction.  Patient in room, family at bedside.  Reports she has struggled to eat well d/t appetite and mouth sores. Currently family ordering her some ice cream to mix with her Ensure Max which will help increase the kcals in the supplement plus taste better. Pt reports she had a somewhat formed stool today. Has been trying to eat more, had some cheerios with milk, some fruit and yogurt this morning. Has been eating applesauce and bananas as well. We reviewed foods that could help bulk up her stool. Reviewed ways to increase kcals and protein with diet.   Per weight records, pt's weight has stayed between 97-103 lbs since April.   Medications: Zofran -ODT  Lab reviewed: Low Na Low Phos    NUTRITION - FOCUSED PHYSICAL EXAM:  Flowsheet Row Most Recent Value  Orbital Region Moderate depletion  Upper Arm Region Moderate depletion  Thoracic and Lumbar Region Unable to assess  Buccal Region Moderate depletion  Temple Region Severe depletion  Clavicle Bone Region Severe depletion  Clavicle and Acromion Bone Region Severe depletion  Scapular Bone Region Severe depletion  Dorsal Hand Severe depletion  Patellar Region Unable to assess  Anterior Thigh Region Unable to assess  Posterior Calf Region Unable to assess  Edema (RD Assessment) None  Hair Unable to assess  [covered]  Eyes Reviewed  Mouth Reviewed  Skin Reviewed  Nails Reviewed       Diet Order:   Diet Order             DIET SOFT Room service appropriate? Yes; Fluid consistency: Thin  Diet effective now                   EDUCATION NEEDS:   Education needs have been addressed  Skin:  Skin Assessment: Reviewed RN Assessment  Last BM:  6/4  Height:   Ht Readings from Last 1 Encounters:  08/30/23 5\' 4"  (1.626 m)    Weight:   Wt Readings from Last 1 Encounters:  08/30/23 45.8 kg    BMI:  Body mass index is 17.34 kg/m.  Estimated Nutritional Needs:   Kcal:  1650-1850  Protein:  80-95g  Fluid:  1.8L/Chirco   Arna Better, MS, RD, LDN Inpatient Clinical Dietitian Contact via Secure chat

## 2023-09-04 NOTE — Progress Notes (Signed)
*  PRELIMINARY RESULTS* Echocardiogram 2D Echocardiogram has been performed.  Rebecca [name] 09/04/2023, 2:34 PM

## 2023-09-04 NOTE — Plan of Care (Signed)
   Problem: Nutrition: Goal: Adequate nutrition will be maintained Outcome: Progressing   Problem: Elimination: Goal: Will not experience complications related to bowel motility Outcome: Progressing   Problem: Pain Managment: Goal: General experience of comfort will improve and/or be controlled Outcome: Progressing   Problem: Safety: Goal: Ability to remain free from injury will improve Outcome: Progressing

## 2023-09-04 NOTE — Discharge Instructions (Signed)

## 2023-09-04 NOTE — Progress Notes (Signed)
 PROGRESS NOTE    Rebecca [name]  VWU:[id number] DOB: [birth year] DOA: 08/30/2023 PCP: Estil Heman, NP    Brief Narrative:  73 year old female with history of anal cancer completed last dose of chemotherapy 1 week prior to admit, radiation therapy 1 [place] prior to arrival presented to infusion center with low-grade temperature, abdominal pain and reported diarrhea for a long time.  Hemodynamically stable.  Sent to ER.  Neutropenic with white cell count of 1.7 and temperature 100.7.  CT scan with inflammatory versus infectious enteritis with possible transition point but no complete small bowel obstruction.  Admitted with symptomatic management.  Oncology and surgery following. Remains in the hospital due to not being able to tolerate diet. Gradual clinical improvement.  Subjective:  Patient seen and examined.  Looks overall comfortable today.  She was able to eat some Cheerios and milk today.  She is on scheduled Zofran  IV and has not experienced any nausea or vomiting for last 48 hours.  Mild abdominal distention but not much pain.  She had 1 bowel movement since last 12 hours that was slightly formed.  Overall she is very optimistic today.  Family at the bedside.  Discussed with surgery.  Advancing diet to soft diet.   Assessment & Plan:   Neutropenic fever with inflammatory versus infectious enteritis: Partial small bowel obstruction due to ileus secondary to above.  Slow recovery.  Now tolerating liquid diet.  Will slowly advance to soft diet. Neutropenic precautions.  Blood cultures are negative.  She was treated with broad-spectrum antibiotics.  Will continue Levaquin to complete total 7 days of therapy. Encourage oral nutrition.  Discontinue IV fluids today.  Mobilize. Restarted on dexamethasone  by palliative care team.  Hypochloremic hyponatremia: Likely related to fluid loss.  Sodium 121-124-127-128-130 DC further IV fluids.  Hypophosphatemia: Replaced.  Will keep on oral  replacement.  Level 2.4 today.  Anal cancer: As above.  Oncology following.  Currently completed therapies.  Outpatient follow-up.  Hyperlipidemia: Continue statin on discharge. Holding due to poor oral intake.  Severe protein calorie malnutrition: Consult nutrition. Nutrition Status: Nutrition Problem: Severe Malnutrition Etiology: chronic illness, cancer and cancer related treatments Signs/Symptoms: energy intake < or equal to 75% for > or equal to 1 month, severe muscle depletion, moderate fat depletion Interventions: Premier Protein, Education    Perineal skin excoriation: Wound care as per surgical recommendations.  Tachycardia: Stabilizing.  Check echocardiogram to rule out any cardiomyopathy caused by chemotherapy.   DVT prophylaxis: SCDs Start: 08/30/23 2219   Code Status: DNR Family Communication: Husband and daughter at the bedside.   Disposition Plan: Status is: Inpatient Remains inpatient appropriate because: Symptomatic, challenging oral intake.     Consultants:  General Surgery Oncology Palliative care  Procedures:  None  Antimicrobials:  IV Zosyn  5/30--- 6/3 Levaquin 6/3---     Objective: Vitals:   09/03/23 0506 09/03/23 1258 09/03/23 2123 09/04/23 0617  BP: 132/69 127/64 137/77 131/70  Pulse: 97 94 77 (!) 111  Resp: 16 17 18 18   Temp: 98 F (36.7 C) 98.4 F (36.9 C) 97.7 F (36.5 C) 97.8 F (36.6 C)  TempSrc: Oral Oral Oral Oral  SpO2: 94% 99% 97% 97%  Weight:      Height:        Intake/Output Summary (Last 24 hours) at 09/04/2023 1342 Last data filed at 09/04/2023 0951 Gross per 24 hour  Intake 810 ml  Output --  Net 810 ml   Filed Weights   08/30/23 1701 08/30/23 1816  Weight: 45.8 kg 45.8 kg    Examination:  General: Frail and chronically sick looking.  Thin and cachectic.  Looks fairly comfortable today.  On room air. Cardiovascular: S1-S2 normal.  Regular rate rhythm. Respiratory: Bilateral clear.  No added  sounds. Gastrointestinal: Soft.  Mildly distended.  Nontender..  No rigidity or guarding.  Bowel sounds present. Ext: No swelling or edema.  No cyanosis. Neuro: Alert awake and oriented. Musculoskeletal: No deformities. Skin: Skin excoriations along the perineum secondary to radiation.      Data Reviewed: I have personally reviewed following labs and imaging studies  CBC: Recent Labs  Lab 08/30/23 1533 08/30/23 1720 08/31/23 0806 09/01/23 0748 09/02/23 0522 09/04/23 1037  WBC 2.3* 1.7* 1.8* 2.1* 1.5* 2.3*  NEUTROABS 1.2* 1.1*  --  1.4* 0.9* 1.1*  HGB 12.7 13.0 11.8* 11.9* 11.7* 11.5*  HCT 36.1 38.9 35.7* 36.3 34.7* 35.3*  MCV 86.6 89.4 90.6 89.4 89.4 89.8  PLT 207 212 181 166 145* 97*   Basic Metabolic Panel: Recent Labs  Lab 08/30/23 1720 08/31/23 0806 09/01/23 0748 09/02/23 0522 09/04/23 1037  NA 121* 124* 127* 128* 130*  K 3.6 3.8 3.8 4.1 3.9  CL 89* 94* 95* 94* 97*  CO2 23 25 26 25 23   GLUCOSE 115* 94 104* 105* 136*  BUN 10 7* 9 6* 9  CREATININE 0.67 0.48 0.50 0.53 0.57  CALCIUM  7.3* 7.0* 7.2* 7.4* 7.5*  MG  --  2.1 2.0 1.8 1.9  PHOS  --   --  2.2* 1.5* 2.4*   GFR: Estimated Creatinine Clearance: 46.6 mL/min (by C-G formula based on SCr of 0.57 mg/dL). Liver Function Tests: Recent Labs  Lab 08/30/23 1720 09/01/23 0748 09/04/23 1037  AST 33 18 37  ALT 23 17 28   ALKPHOS 121 140* 427*  BILITOT 0.9 1.2 0.9  PROT 5.9* 4.7* 4.6*  ALBUMIN 2.2* 1.7* 1.7*   No results for input(s): "LIPASE", "AMYLASE" in the last 168 hours. No results for input(s): "AMMONIA" in the last 168 hours. Coagulation Profile: Recent Labs  Lab 08/30/23 1720  INR 1.2   Cardiac Enzymes: No results for input(s): "CKTOTAL", "CKMB", "CKMBINDEX", "TROPONINI" in the last 168 hours. BNP (last 3 results) No results for input(s): "PROBNP" in the last 8760 hours. HbA1C: No results for input(s): "HGBA1C" in the last 72 hours. CBG: No results for input(s): "GLUCAP" in the last 168  hours. Lipid Profile: No results for input(s): "CHOL", "HDL", "LDLCALC", "TRIG", "CHOLHDL", "LDLDIRECT" in the last 72 hours. Thyroid  Function Tests: No results for input(s): "TSH", "T4TOTAL", "FREET4", "T3FREE", "THYROIDAB" in the last 72 hours. Anemia Panel: No results for input(s): "VITAMINB12", "FOLATE", "FERRITIN", "TIBC", "IRON", "RETICCTPCT" in the last 72 hours. Sepsis Labs: Recent Labs  Lab 08/30/23 1737 08/30/23 2019  LATICACIDVEN 1.3 1.1    Recent Results (from the past 240 hours)  Blood Culture (routine x 2)     Status: None   Collection Time: 08/30/23  5:20 PM   Specimen: BLOOD LEFT ARM  Result Value Ref Range Status   Specimen Description   Final    BLOOD LEFT ARM Performed at [facility], [address] [place], [place] [address]    Special Requests   Final    BOTTLES DRAWN AEROBIC AND ANAEROBIC Blood Culture adequate volume Performed at [facility], [address], [place], [place] [address]    Culture   Final    NO GROWTH 5 DAYS Performed at [facility], [address], [place], [place] [address]    Report Status 09/04/2023 FINAL  Final  Blood Culture (routine x 2)     Status: None   Collection Time: 08/30/23  6:00 PM   Specimen: BLOOD  Result Value Ref Range Status   Specimen Description   Final    BLOOD SITE NOT SPECIFIED Performed at [facility], [address] [place], [place] [address]    Special Requests   Final    BOTTLES DRAWN AEROBIC AND ANAEROBIC Blood Culture results may not be optimal due to an inadequate volume of blood received in culture bottles Performed at [facility], [address] [place], [place] [address]    Culture   Final    NO GROWTH 5 DAYS Performed at [facility], [address], [place], [place] [address]    Report Status 09/04/2023 FINAL  Final  Resp panel by RT-PCR (RSV, Flu A&B, Covid) Anterior Nasal Swab     Status: None   Collection  Time: 08/30/23  6:31 PM   Specimen: Anterior Nasal Swab  Result Value Ref Range Status   SARS Coronavirus 2 by RT PCR NEGATIVE NEGATIVE Final    Comment: (NOTE) SARS-CoV-2 target nucleic acids are NOT DETECTED.  The SARS-CoV-2 RNA is generally detectable in upper respiratory specimens during the acute phase of infection. The lowest concentration of SARS-CoV-2 viral copies this assay can detect is 138 copies/mL. A negative result does not preclude SARS-Cov-2 infection and should not be used as the sole basis for treatment or other patient management decisions. A negative result may occur with  improper specimen collection/handling, submission of specimen other than nasopharyngeal swab, presence of viral mutation(s) within the areas targeted by this assay, and inadequate number of viral copies(<138 copies/mL). A negative result must be combined with clinical observations, patient history, and epidemiological information. The expected result is Negative.  Fact Sheet for Patients:  BloggerCourse.com  Fact Sheet for Healthcare Providers:  SeriousBroker.it  This test is no t yet approved or cleared by the [place]  FDA and  has been authorized for detection and/or diagnosis of SARS-CoV-2 by FDA under an Emergency Use Authorization (EUA). This EUA will remain  in effect (meaning this test can be used) for the duration of the COVID-19 declaration under Section 564(b)(1) of the Act, 21 U.S.C.section 360bbb-3(b)(1), unless the authorization is terminated  or revoked sooner.       Influenza A by PCR NEGATIVE NEGATIVE Final   Influenza B by PCR NEGATIVE NEGATIVE Final    Comment: (NOTE) The Xpert Xpress SARS-CoV-2/FLU/RSV plus assay is intended as an aid in the diagnosis of influenza from Nasopharyngeal swab specimens and should not be used as a sole basis for treatment. Nasal washings and aspirates are unacceptable for Xpert Xpress  SARS-CoV-2/FLU/RSV testing.  Fact Sheet for Patients: BloggerCourse.com  Fact Sheet for Healthcare Providers: SeriousBroker.it  This test is not yet approved or cleared by the [place]  FDA and has been authorized for detection and/or diagnosis of SARS-CoV-2 by FDA under an Emergency Use Authorization (EUA). This EUA will remain in effect (meaning this test can be used) for the duration of the COVID-19 declaration under Section 564(b)(1) of the Act, 21 U.S.C. section 360bbb-3(b)(1), unless the authorization is terminated or revoked.     Resp Syncytial Virus by PCR NEGATIVE NEGATIVE Final    Comment: (NOTE) Fact Sheet for Patients: BloggerCourse.com  Fact Sheet for Healthcare Providers: SeriousBroker.it  This test is not yet approved or  cleared by the [place]  FDA and has been authorized for detection and/or diagnosis of SARS-CoV-2 by FDA under an Emergency Use Authorization (EUA). This EUA will remain in effect (meaning this test can be used) for the duration of the COVID-19 declaration under Section 564(b)(1) of the Act, 21 U.S.C. section 360bbb-3(b)(1), unless the authorization is terminated or revoked.  Performed at [facility], [address], [place], [place] [address]   Urine Culture     Status: Abnormal   Collection Time: 08/30/23  8:00 PM   Specimen: Urine, Clean Catch  Result Value Ref Range Status   Specimen Description   Final    URINE, CLEAN CATCH Performed at [facility] [address] [place], [place] [address]    Special Requests   Final    NONE Performed at [facility], [address] [place], [place] [address]    Culture MULTIPLE SPECIES PRESENT, SUGGEST RECOLLECTION (A)  Final   Report Status 08/31/2023 FINAL  Final  C Difficile Quick Screen (NO PCR Reflex)     Status: None    Collection Time: 09/03/23  7:37 AM   Specimen: STOOL  Result Value Ref Range Status   C Diff antigen NEGATIVE NEGATIVE Final   C Diff toxin NEGATIVE NEGATIVE Final   C Diff interpretation No C. difficile detected.  Final    Comment: Performed at [facility], [address], [place], [place] [address]  Gastrointestinal Panel by PCR , Stool     Status: None   Collection Time: 09/03/23  7:54 AM   Specimen: Stool  Result Value Ref Range Status   Campylobacter species NOT DETECTED NOT DETECTED Final   Plesimonas shigelloides NOT DETECTED NOT DETECTED Final   Salmonella species NOT DETECTED NOT DETECTED Final   Yersinia enterocolitica NOT DETECTED NOT DETECTED Final   Vibrio species NOT DETECTED NOT DETECTED Final   Vibrio cholerae NOT DETECTED NOT DETECTED Final   Enteroaggregative E coli (EAEC) NOT DETECTED NOT DETECTED Final   Enteropathogenic E coli (EPEC) NOT DETECTED NOT DETECTED Final   Enterotoxigenic E coli (ETEC) NOT DETECTED NOT DETECTED Final   Shiga like toxin producing E coli (STEC) NOT DETECTED NOT DETECTED Final   Shigella/Enteroinvasive E coli (EIEC) NOT DETECTED NOT DETECTED Final   Cryptosporidium NOT DETECTED NOT DETECTED Final   Cyclospora cayetanensis NOT DETECTED NOT DETECTED Final   Entamoeba histolytica NOT DETECTED NOT DETECTED Final   Giardia lamblia NOT DETECTED NOT DETECTED Final   Adenovirus F40/41 NOT DETECTED NOT DETECTED Final   Astrovirus NOT DETECTED NOT DETECTED Final   Norovirus GI/GII NOT DETECTED NOT DETECTED Final   Rotavirus A NOT DETECTED NOT DETECTED Final   Sapovirus (I, II, IV, and V) NOT DETECTED NOT DETECTED Final    Comment: Performed at [facility], [address], [place], [place] [address]         Radiology Studies: No results found.       Scheduled Meds:  dexamethasone  (DECADRON ) injection  4 mg Intravenous Daily   [name]'s butt cream   Topical TID   levofloxacin  750 mg Oral Q48H    ondansetron   4 mg Oral Q6H   [START ON 09/05/2023] pantoprazole   40 mg Oral Daily   phosphorus  250 mg Oral TID   Ensure Max Protein  11 oz Oral BID   silver  sulfADIAZINE   1 Application Topical BID   sodium chloride  flush  10-40 mL Intracatheter Q12H  Continuous Infusions:     LOS: 5 days    Time spent: 50 minutes    Vada Garibaldi, MD [facility]

## 2023-09-04 NOTE — Progress Notes (Signed)
 [facility] Spiritual Care Note  Followed up with Rebecca [name] at her bedside. She was very pleased to follow up, especially because she is feeling stronger/more able to perform her own ADLs and thus closer to being able to go home. Seeing this change in herself is buoying her up emotionally, and she presents as more upbeat and energetic.  Provided compassionate presence, empathic listening, and emotional support. She and Rebecca [name] both plan to follow up as needed/desired.    [address] Rebecca [name], [place], [facility] Pager [phone] Voicemail [phone]

## 2023-09-05 ENCOUNTER — Encounter: Payer: Self-pay | Admitting: *Deleted

## 2023-09-05 DIAGNOSIS — K529 Noninfective gastroenteritis and colitis, unspecified: Secondary | ICD-10-CM | POA: Diagnosis not present

## 2023-09-05 MED ORDER — ACETAMINOPHEN 325 MG PO TABS: 650.0000 mg | ORAL_TABLET | Freq: Four times a day (QID) | ORAL | Status: DC | PRN

## 2023-09-05 MED ORDER — GERHARDT'S BUTT CREAM
1.0000 | TOPICAL_CREAM | Freq: Three times a day (TID) | CUTANEOUS | 0 refills | Status: DC
  Filled 2023-09-06: qty 60, 20d supply, fill #0

## 2023-09-05 MED ORDER — K PHOS MONO-SOD PHOS DI & MONO 155-852-130 MG PO TABS: 250.0000 mg | ORAL_TABLET | Freq: Three times a day (TID) | ORAL | 0 refills | Status: DC

## 2023-09-05 MED ORDER — ONDANSETRON HCL 8 MG PO TABS
8.0000 mg | ORAL_TABLET | Freq: Three times a day (TID) | ORAL | 0 refills | Status: DC | PRN
Start: 2023-09-05 — End: 2023-09-30

## 2023-09-05 MED ORDER — DEXAMETHASONE 4 MG PO TABS: 4.0000 mg | ORAL_TABLET | Freq: Every day | ORAL | 0 refills | Status: AC

## 2023-09-05 MED ORDER — LEVOFLOXACIN 500 MG PO TABS: 750.0000 mg | ORAL_TABLET | Freq: Once | ORAL | Status: DC

## 2023-09-05 MED ORDER — OXYCODONE HCL 5 MG PO TABS: 5.0000 mg | ORAL_TABLET | Freq: Four times a day (QID) | ORAL | 0 refills | Status: AC | PRN

## 2023-09-05 NOTE — Progress Notes (Signed)
 Oncology Discharge Planning Note  [facility] Address: [address], [place], [place] [address] Hours of Operation:  [place], Monday - Friday  Clinic Contact Information:  [phone]  Oncology Care Team: Medical Oncologist:  Scherrie Curt  Patient Details: Name:  Rebecca [name], Rebecca [name] MRN:   [id number] DOB:   [birth year] Reason for Current Admission: @PPROB @  Discharge Planning Narrative: Notification of admission received by inpatient team for [name].  Discharge follow-up appointments for oncology are current and available on the AVS and MyChart.   Upon discharge from the hospital, hematology/oncology's post discharge plan of care for the outpatient setting is:  June 17,2025 at 10:30am With [name] NP  [facility] [address] [place], [place] [address]  [phone]   [name] will be called within two business days after discharge to review hematology/oncology's plan of care for full understanding.    Outpatient Oncology Specific Care Only: Oncology appointment transportation needs addressed?:  no Oncology medication management for symptom management addressed?:  yes Chemo Alert Card reviewed?:  yes Immunotherapy Alert Card reviewed?:  not applicable

## 2023-09-05 NOTE — TOC CM/SW Note (Signed)
 Pt recommended for RW and BSC. CSW ordered DME through [employer] to be delivered to pt home.

## 2023-09-05 NOTE — Discharge Summary (Signed)
 Physician Discharge Summary  Rebecca [name] NWG:[id number] DOB: 1950/11/14 DOA: 08/30/2023  PCP: Estil Heman, NP  Admit date: 08/30/2023 Discharge date: 09/05/2023  Admitted From: Home Disposition: Home  Recommendations for Outpatient Follow-up:  Follow up with PCP in 1-2 weeks Please obtain BMP/CBC/magnesium/phosphorus Oncology clinic to schedule follow-up  Home Health: N/A Equipment/Devices: Bedside commode, walker  Discharge Condition: Stable CODE STATUS: DNR with limited intervention Diet recommendation: Soft diet, nutritional supplements.  Gradually advance diet as tolerated.  Discharge summary: 73 year old female with history of anal cancer completed last dose of chemotherapy 1 week prior to admit, radiation therapy 1 Guitron prior to arrival presented to infusion center with low-grade temperature, abdominal pain and reported diarrhea for a long time.  Hemodynamically stable.  Sent to ER.  Neutropenic with white cell count of 1.7 and temperature 100.7.  CT scan with inflammatory versus infectious enteritis with possible transition point but no complete small bowel obstruction.  Admitted with symptomatic management.  Oncology and surgery following. Patient remained in the hospital due to intolerance to diet and gradual improvement of symptoms.  Today, she has adequate bowel functions as well as oral intake show she is going home.  Treated for following conditions.  Neutropenic fever with inflammatory enteritis: Ileus secondary to above.  Patient was treated with IV fluids, pain medications, around-the-clock nausea medications and IV antibiotics with gradual improvement of symptoms.  Currently she is able to tolerate soft diet, her stool is getting more formed and belly pain has improved. Completed antibiotic therapy.  No evidence of bacterial infection currently.  C. difficile and GI pathogen panel negative. Responded well on dexamethasone .  Will prescribe 4 additional days of  dexamethasone  4 mg daily. Soft diet, slowly advance diet.  May use pain medications as needed.  May use nausea medications as needed.   Hypochloremic hyponatremia: Likely related to fluid loss.  Sodium 121-124-127-128-130 Patient is maintaining sodium levels without additional isotonic fluid. Electrolytes are adequate today.  Will discharge patient on a scheduled phosphorus.  Anal cancer: As above.  Oncology following.  Currently completed therapies.  Outpatient follow-up.   Hyperlipidemia: Resume statin on discharge.  Perineal skin excoriation: Wound care as per surgical recommendations.  Clinically stabilizing.  Can be discharged home with outpatient follow-up.  Oncology clinic to schedule follow-up.    Discharge Diagnoses:  Principal Problem:   Enteritis Active Problems:   Protein-calorie malnutrition, severe    Discharge Instructions  Discharge Instructions     Amb Referral to Palliative Care   Complete by: As directed    Diet general   Complete by: As directed    Discharge wound care:   Complete by: As directed    [name] cream and estrogen cream to apply at rashes   Increase activity slowly   Complete by: As directed       Allergies as of 09/05/2023       Reactions   Other Itching, Other (See Comments)   Pollen and cat & dog dander: Itchy eyes, asthma is triggered, nasal congestion        Medication List     STOP taking these medications    Advil 200 MG Caps Generic drug: Ibuprofen       TAKE these medications    acetaminophen  325 MG tablet Commonly known as: TYLENOL  Take 2 tablets (650 mg total) by mouth every 6 (six) hours as needed for mild pain (pain score 1-3) or fever (or Fever >/= 101).   albuterol  108 (90 Base) MCG/ACT inhaler  Commonly known as: VENTOLIN  HFA Inhale 1 puff into the lungs every 6 (six) hours as needed for wheezing or shortness of breath.   budesonide -formoterol  80-4.5 MCG/ACT inhaler Commonly known as: Symbicort  TAKE  2 PUFFS BY MOUTH TWICE A Farnell What changed:  how much to take how to take this when to take this reasons to take this additional instructions   dexamethasone  4 MG tablet Commonly known as: DECADRON  Take 1 tablet (4 mg total) by mouth daily for 4 days.   estradiol  0.1 MG/GM vaginal cream Commonly known as: ESTRACE  Place 1 Applicatorful vaginally See admin instructions. Apply as directed to the labial area in the morning and at bedtime   fexofenadine  180 MG tablet Commonly known as: ALLEGRA  Take 1 tablet (180 mg total) by mouth daily. What changed: when to take this   [name]'s butt cream Crea Apply 1 Application topically 3 (three) times daily.   magic mouthwash Soln Take 5 mLs by mouth 4 (four) times daily as needed for mouth pain.   omeprazole  20 MG capsule Commonly known as: PRILOSEC Take 1 capsule (20 mg total) by mouth daily. What changed: when to take this   ondansetron  8 MG tablet Commonly known as: ZOFRAN  Take 1 tablet (8 mg total) by mouth every 8 (eight) hours as needed for nausea or vomiting. What changed:  when to take this reasons to take this   oxyCODONE  5 MG immediate release tablet Commonly known as: Roxicodone  Take 1 tablet (5 mg total) by mouth every 6 (six) hours as needed for up to 5 days for moderate pain (pain score 4-6) or severe pain (pain score 7-10).   phosphorus 155-852-130 MG tablet Commonly known as: K PHOS NEUTRAL Take 1 tablet (250 mg total) by mouth 3 (three) times daily for 7 days.   prochlorperazine  10 MG tablet Commonly known as: COMPAZINE  Take 1 tablet (10 mg total) by mouth every 6 (six) hours as needed for nausea or vomiting.   rosuvastatin  5 MG tablet Commonly known as: CRESTOR  TAKE 1 TABLET BY MOUTH ONCE DAILY What changed: when to take this   silver  sulfADIAZINE  1 % cream Commonly known as: SILVADENE  Apply 1 Application topically See admin instructions. Apply as directed to the labial area in the morning and at  bedtime               Durable Medical Equipment  (From admission, onward)           Start     Ordered   09/05/23 1107  For home use only DME Walker rolling  Once       Question Answer Comment  Walker: With 5 Inch Wheels   Patient needs a walker to treat with the following condition Anal cancer (HCC)      09/05/23 1106   09/05/23 1106  For home use only DME Bedside commode  Once       Question:  Patient needs a bedside commode to treat with the following condition  Answer:  Anal cancer (HCC)   09/05/23 1106              Discharge Care Instructions  (From admission, onward)           Start     Ordered   09/05/23 0000  Discharge wound care:       Comments: [name] cream and estrogen cream to apply at rashes   09/05/23 1103            Allergies  Allergen  Reactions   Other Itching and Other (See Comments)    Pollen and cat & dog dander: Itchy eyes, asthma is triggered, nasal congestion    Consultations: Surgery Oncology   Procedures/Studies: ECHOCARDIOGRAM COMPLETE Result Date: 09/04/2023    ECHOCARDIOGRAM REPORT   Patient Name:   Rebecca [name] Date of Exam: 09/04/2023 Medical Rec #:  [id number]   Height:       64.0 in Accession #:    [id number]  Weight:       101.0 lb Date of Birth:  [birth year]   BSA:          1.463 m Patient Age:    73 years    BP:           131/70 mmHg Patient Gender: F           HR:           85 bpm. Exam Location:  Inpatient Procedure: 2D Echo, Cardiac Doppler and Color Doppler (Both Spectral and Color            Flow Doppler were utilized during procedure). Indications:    R94.31 Abnormal EKG  History:        Patient has no prior history of Echocardiogram examinations.  Sonographer:    Andrena Bang Referring Phys: [id number] Sharyah Bostwick IMPRESSIONS  1. Left ventricular ejection fraction, by estimation, is 60 to 65%. The left ventricle has normal function. The left ventricle has no regional wall motion abnormalities. Left ventricular  diastolic parameters are consistent with Grade I diastolic dysfunction (impaired relaxation).  2. Right ventricular systolic function is normal. The right ventricular size is normal. There is normal pulmonary artery systolic pressure. The estimated right ventricular systolic pressure is 25.7 mmHg.  3. The mitral valve is normal in structure. Trivial mitral valve regurgitation. No evidence of mitral stenosis.  4. The aortic valve is tricuspid. There is mild calcification of the aortic valve. Aortic valve regurgitation is not visualized. Aortic valve sclerosis/calcification is present, without any evidence of aortic stenosis.  5. The inferior vena cava is normal in size with greater than 50% respiratory variability, suggesting right atrial pressure of 3 mmHg. FINDINGS  Left Ventricle: Left ventricular ejection fraction, by estimation, is 60 to 65%. The left ventricle has normal function. The left ventricle has no regional wall motion abnormalities. The left ventricular internal cavity size was normal in size. There is  no left ventricular hypertrophy. Left ventricular diastolic parameters are consistent with Grade I diastolic dysfunction (impaired relaxation). Right Ventricle: The right ventricular size is normal. No increase in right ventricular wall thickness. Right ventricular systolic function is normal. There is normal pulmonary artery systolic pressure. The tricuspid regurgitant velocity is 2.38 m/s, and  with an assumed right atrial pressure of 3 mmHg, the estimated right ventricular systolic pressure is 25.7 mmHg. Left Atrium: Left atrial size was normal in size. Right Atrium: Right atrial size was normal in size. Pericardium: There is no evidence of pericardial effusion. Mitral Valve: The mitral valve is normal in structure. Trivial mitral valve regurgitation. No evidence of mitral valve stenosis. Tricuspid Valve: The tricuspid valve is normal in structure. Tricuspid valve regurgitation is mild . No evidence  of tricuspid stenosis. Aortic Valve: The aortic valve is tricuspid. There is mild calcification of the aortic valve. Aortic valve regurgitation is not visualized. Aortic valve sclerosis/calcification is present, without any evidence of aortic stenosis. Aortic valve mean gradient measures 2.0 mmHg. Aortic valve peak gradient measures 4.9 mmHg. Aortic valve area,  by VTI measures 1.54 cm. Pulmonic Valve: The pulmonic valve was normal in structure. Pulmonic valve regurgitation is trivial. No evidence of pulmonic stenosis. Aorta: The aortic root is normal in size and structure. Venous: The inferior vena cava is normal in size with greater than 50% respiratory variability, suggesting right atrial pressure of 3 mmHg. IAS/Shunts: No atrial level shunt detected by color flow Doppler.  LEFT VENTRICLE PLAX 2D LVIDd:         3.00 cm     Diastology LVIDs:         2.20 cm     LV e' medial:    6.85 cm/s LV PW:         1.10 cm     LV E/e' medial:  12.4 LV IVS:        0.80 cm     LV e' lateral:   10.40 cm/s LVOT diam:     1.50 cm     LV E/e' lateral: 8.2 LV SV:         36 LV SV Index:   25 LVOT Area:     1.77 cm  LV Volumes (MOD) LV vol d, MOD A2C: 41.2 ml LV vol d, MOD A4C: 47.8 ml LV vol s, MOD A2C: 15.7 ml LV vol s, MOD A4C: 14.9 ml LV SV MOD A2C:     25.5 ml LV SV MOD A4C:     47.8 ml LV SV MOD BP:      28.9 ml RIGHT VENTRICLE RV S prime:     11.10 cm/s TAPSE (M-mode): 1.4 cm LEFT ATRIUM             Index LA diam:        2.50 cm 1.71 cm/m LA Vol (A2C):   14.8 ml 10.11 ml/m LA Vol (A4C):   19.4 ml 13.26 ml/m LA Biplane Vol: 17.4 ml 11.89 ml/m  AORTIC VALVE AV Area (Vmax):    1.64 cm AV Area (Vmean):   1.41 cm AV Area (VTI):     1.54 cm AV Vmax:           111.00 cm/s AV Vmean:          70.900 cm/s AV VTI:            0.234 m AV Peak Grad:      4.9 mmHg AV Mean Grad:      2.0 mmHg LVOT Vmax:         103.00 cm/s LVOT Vmean:        56.500 cm/s LVOT VTI:          0.204 m LVOT/AV VTI ratio: 0.87 MITRAL VALVE                 TRICUSPID VALVE MV Area (PHT): 3.63 cm     TR Peak grad:   22.7 mmHg MV Decel Time: 209 msec     TR Vmax:        238.00 cm/s MV E velocity: 85.10 cm/s MV A velocity: 107.00 cm/s  SHUNTS MV E/A ratio:  0.80         Systemic VTI:  0.20 m                             Systemic Diam: 1.50 cm Jules Oar MD Electronically signed by Jules Oar MD Signature Date/Time: 09/04/2023/3:22:31 PM    Final    DG Abd Portable 1V Result Date: 09/01/2023 CLINICAL  DATA:  Follow up small bowel obstruction EXAM: PORTABLE ABDOMEN - 1 VIEW COMPARISON:  08/31/2023 FINDINGS: Previously administered contrast now lies entirely within the colon. No persistent small bowel dilatation is seen. No free air is noted. Contrast is seen within the bladder. IMPRESSION: No evidence of small-bowel obstruction. Electronically Signed   By: Violeta Grey M.D.   On: 09/01/2023 10:17   DG Abd Portable 1V-Small Bowel Obstruction Protocol-initial, 8 hr delay Result Date: 08/31/2023 CLINICAL DATA:  SBO Protocol, 8 hr delay image EXAM: PORTABLE ABDOMEN - 1 VIEW COMPARISON:  X-ray abdomen 08/31/2023, CT abdomen pelvis 08/30/2023. FINDINGS: PO contrast opacifying the majority of the small bowel that is distended up to 2.9 cm. Likely PO contrast within the ascending colon. No radio-opaque calculi or other significant radiographic abnormality are seen. IMPRESSION: PO contrast opacifying the majority of the small bowel that is distended up to 2.9 cm. Likely PO contrast within the ascending colon. Recommend attention on follow-up. Electronically Signed   By: Morgane  Naveau M.D.   On: 08/31/2023 19:31   DG Abd 1 View Result Date: 08/31/2023 CLINICAL DATA:  Abdominal pain and bloating for several weeks nausea and vomiting. Anal carcinoma. EXAM: ABDOMEN - 1 VIEW COMPARISON:  None FINDINGS: Mildly dilated small bowel loops are seen in the mid abdomen. Colonic gas is noted, but is not dilated. These findings may be seen with partial small bowel obstruction  or ileus. Contrast is seen in the urinary bladder from recent CT. IMPRESSION: Partial small bowel obstruction versus ileus. Electronically Signed   By: Marlyce Sine M.D.   On: 08/31/2023 09:34   CT ABDOMEN PELVIS W CONTRAST Result Date: 08/30/2023 CLINICAL DATA:  Generalized abdominal pain and fever.  Anal cancer. EXAM: CT ABDOMEN AND PELVIS WITH CONTRAST TECHNIQUE: Multidetector CT imaging of the abdomen and pelvis was performed using the standard protocol following bolus administration of intravenous contrast. RADIATION DOSE REDUCTION: This exam was performed according to the departmental dose-optimization program which includes automated exposure control, adjustment of the mA and/or kV according to patient size and/or use of iterative reconstruction technique. CONTRAST:  OMNIPAQUE  IOHEXOL  300 MG/ML  SOLN COMPARISON:  Head CT 05/21/2023. FINDINGS: Lower chest: No acute abnormality. Hepatobiliary: No focal liver abnormality is seen. No gallstones, gallbladder wall thickening, or biliary dilatation. Pancreas: Unremarkable. No pancreatic ductal dilatation or surrounding inflammatory changes. Spleen: Normal in size without focal abnormality. Adrenals/Urinary Tract: Adrenal glands are unremarkable. Kidneys are normal, without renal calculi, focal lesion, or hydronephrosis. Bladder is unremarkable. Stomach/Bowel: There is a moderate length segment of circumferential wall thickening and inflammation of the distal ileum/terminal ileum. This is transition point for small bowel obstruction were dilated bowel loops measure up to 3.3 cm proximal to this level. The stomach is distended and fluid-filled. There is a small hiatal hernia. There is also fluid within the distal esophagus. The appendix is not seen. The colon is nondilated there is some edema and mild inflammation in the region theanus. Vascular/Lymphatic: Aortic atherosclerosis. No enlarged abdominal or pelvic lymph nodes. Reproductive: Uterus and bilateral  adnexa are unremarkable. Other: No abdominal wall hernia or abnormality. No abdominopelvic ascites. Musculoskeletal: No fracture is seen. IMPRESSION: 1. Moderate length segment of circumferential wall thickening and inflammation of the distal ileum/terminal ileum. This is transition point for small bowel obstruction. Findings are concerning for infectious or inflammatory enteritis. 2. Distended stomach with fluid in the distal esophagus. 3. Small hiatal hernia. 4. Mild edema and inflammation in the region of the anus. 5. Aortic atherosclerosis.  Aortic Atherosclerosis (ICD10-I70.0). Electronically Signed   By: Tyron Gallon M.D.   On: 08/30/2023 21:05   DG Chest Port 1 View Result Date: 08/30/2023 CLINICAL DATA:  Concern for sepsis. EXAM: PORTABLE CHEST 1 VIEW COMPARISON:  03/14/2020. FINDINGS: The heart size and mediastinal contours are within normal limits. Hyperinflation. No focal consolidation, pleural effusion, or pneumothorax. No acute osseous abnormality. IMPRESSION: No acute cardiopulmonary findings. Electronically Signed   By: Mannie Seek M.D.   On: 08/30/2023 19:08   IR PICC PLACEMENT LEFT >5 YRS INC IMG GUIDE Result Date: 08/19/2023 INDICATION: chemo Patient with history of anal cancer; central venous access requested for chemotherapy. EXAM: LEFT UPPER EXTREMITY PICC LINE PLACEMENT WITH ULTRASOUND AND FLUOROSCOPIC GUIDANCE MEDICATIONS: 3 mL 1% lidocaine  to skin/subcutaneous tissue ANESTHESIA/SEDATION: Local anesthetic was administered. FLUOROSCOPY: Radiation Exposure Index and estimated peak skin dose (PSD); Reference air kerma (RAK), 0.1 mGy. COMPLICATIONS: None immediate. PROCEDURE: The patient was advised of the possible risks and complications and agreed to undergo the procedure. The patient was then brought to the angiographic suite for the procedure. The LEFT arm was prepped with chlorhexidine , draped in the usual sterile fashion using maximum barrier technique (cap and mask, sterile gown,  sterile gloves, large sterile sheet, hand hygiene and cutaneous antisepsis) and infiltrated locally with 1% Lidocaine . Ultrasound demonstrated patency of the LEFT basilic vein, and this was documented with an image. Under real-time ultrasound guidance, this vein was accessed with a 21 gauge micropuncture needle and image documentation was performed. A 0.018 wire was introduced in to the vein. Over this, a 5 Fr dual lumen power injectable PICC was advanced to the lower SVC/right atrial junction. Fluoroscopy during the procedure and fluoro spot radiograph confirms appropriate catheter position. The catheter was flushed and covered with a sterile dressing. Catheter length: 41 cm IMPRESSION: Successful LEFT arm power PICC line placement with ultrasound and fluoroscopic guidance. The tip of the catheter is positioned at the superior cavo-atrial junction. the catheter is ready for use., Performed by: Wash Hack Electronically Signed   By: Art Largo M.D.   On: 08/19/2023 11:15   (Echo, Carotid, EGD, Colonoscopy, ERCP)    Subjective: Patient seen and examined.  Husband and daughter at the bedside.  Patient is very motivated to go home today.  Denies any nausea or vomiting.  She has been tolerating soft diet though her appetite is not great.  She had formed stool today.  Occasional abdominal bloating and pain but mostly improved.   Discharge Exam: Vitals:   09/04/23 2051 09/05/23 0510  BP: 133/70 114/61  Pulse: 88 86  Resp: 18 18  Temp: 97.7 F (36.5 C) 97.6 F (36.4 C)  SpO2: 97% 97%   Vitals:   09/03/23 2123 09/04/23 0617 09/04/23 2051 09/05/23 0510  BP: 137/77 131/70 133/70 114/61  Pulse: 77 (!) 111 88 86  Resp: 18 18 18 18   Temp: 97.7 F (36.5 C) 97.8 F (36.6 C) 97.7 F (36.5 C) 97.6 F (36.4 C)  TempSrc: Oral Oral Oral Oral  SpO2: 97% 97% 97% 97%  Weight:      Height:        General: Pt is alert, awake, not in acute distress Thin and cachectic. Cardiovascular: RRR, S1/S2  +, no rubs, no gallops Respiratory: CTA bilaterally, no wheezing, no rhonchi Abdominal: Soft, NT, ND, bowel sounds + Extremities: no edema, no cyanosis Patient has perineal skin excoriations.    The results of significant diagnostics from this hospitalization (including imaging, microbiology, ancillary and laboratory)  are listed below for reference.     Microbiology: Recent Results (from the past 240 hours)  Blood Culture (routine x 2)     Status: None   Collection Time: 08/30/23  5:20 PM   Specimen: BLOOD LEFT ARM  Result Value Ref Range Status   Specimen Description   Final    BLOOD LEFT ARM Performed at [facility], [address] [place], [place] [address]    Special Requests   Final    BOTTLES DRAWN AEROBIC AND ANAEROBIC Blood Culture adequate volume Performed at [facility], [address], [place], [place] [address]    Culture   Final    NO GROWTH 5 DAYS Performed at [facility], [address] [place], [place] [address]    Report Status 09/04/2023 FINAL  Final  Blood Culture (routine x 2)     Status: None   Collection Time: 08/30/23  6:00 PM   Specimen: BLOOD  Result Value Ref Range Status   Specimen Description   Final    BLOOD SITE NOT SPECIFIED Performed at [facility], [address] [place], [place] [address]    Special Requests   Final    BOTTLES DRAWN AEROBIC AND ANAEROBIC Blood Culture results may not be optimal due to an inadequate volume of blood received in culture bottles Performed at [facility], [address], [place], [place] [address]    Culture   Final    NO GROWTH 5 DAYS Performed at [facility], [address], [place], [place] [address]    Report Status 09/04/2023 FINAL  Final  Resp panel by RT-PCR (RSV, Flu A&B, Covid) Anterior Nasal Swab     Status: None   Collection Time: 08/30/23  6:31 PM   Specimen: Anterior Nasal Swab  Result Value Ref Range Status    SARS Coronavirus 2 by RT PCR NEGATIVE NEGATIVE Final    Comment: (NOTE) SARS-CoV-2 target nucleic acids are NOT DETECTED.  The SARS-CoV-2 RNA is generally detectable in upper respiratory specimens during the acute phase of infection. The lowest concentration of SARS-CoV-2 viral copies this assay can detect is 138 copies/mL. A negative result does not preclude SARS-Cov-2 infection and should not be used as the sole basis for treatment or other patient management decisions. A negative result may occur with  improper specimen collection/handling, submission of specimen other than nasopharyngeal swab, presence of viral mutation(s) within the areas targeted by this assay, and inadequate number of viral copies(<138 copies/mL). A negative result must be combined with clinical observations, patient history, and epidemiological information. The expected result is Negative.  Fact Sheet for Patients:  BloggerCourse.com  Fact Sheet for Healthcare Providers:  SeriousBroker.it  This test is no t yet approved or cleared by the [place]  FDA and  has been authorized for detection and/or diagnosis of SARS-CoV-2 by FDA under an Emergency Use Authorization (EUA). This EUA will remain  in effect (meaning this test can be used) for the duration of the COVID-19 declaration under Section 564(b)(1) of the Act, 21 U.S.C.section 360bbb-3(b)(1), unless the authorization is terminated  or revoked sooner.       Influenza A by PCR NEGATIVE NEGATIVE Final   Influenza B by PCR NEGATIVE NEGATIVE Final    Comment: (NOTE) The Xpert Xpress SARS-CoV-2/FLU/RSV plus assay is intended as an aid in the diagnosis of influenza from Nasopharyngeal swab specimens and should not be used as a sole basis for treatment.  Nasal washings and aspirates are unacceptable for Xpert Xpress SARS-CoV-2/FLU/RSV testing.  Fact Sheet for  Patients: BloggerCourse.com  Fact Sheet for Healthcare Providers: SeriousBroker.it  This test is not yet approved or cleared by the [place]  FDA and has been authorized for detection and/or diagnosis of SARS-CoV-2 by FDA under an Emergency Use Authorization (EUA). This EUA will remain in effect (meaning this test can be used) for the duration of the COVID-19 declaration under Section 564(b)(1) of the Act, 21 U.S.C. section 360bbb-3(b)(1), unless the authorization is terminated or revoked.     Resp Syncytial Virus by PCR NEGATIVE NEGATIVE Final    Comment: (NOTE) Fact Sheet for Patients: BloggerCourse.com  Fact Sheet for Healthcare Providers: SeriousBroker.it  This test is not yet approved or cleared by the [place]  FDA and has been authorized for detection and/or diagnosis of SARS-CoV-2 by FDA under an Emergency Use Authorization (EUA). This EUA will remain in effect (meaning this test can be used) for the duration of the COVID-19 declaration under Section 564(b)(1) of the Act, 21 U.S.C. section 360bbb-3(b)(1), unless the authorization is terminated or revoked.  Performed at [facility], [address] [place], [place] [address]   Urine Culture     Status: Abnormal   Collection Time: 08/30/23  8:00 PM   Specimen: Urine, Clean Catch  Result Value Ref Range Status   Specimen Description   Final    URINE, CLEAN CATCH Performed at [facility], [address] [place], [place] [address]    Special Requests   Final    NONE Performed at [facility], [address] [place], [place] [address]    Culture MULTIPLE SPECIES PRESENT, SUGGEST RECOLLECTION (A)  Final   Report Status 08/31/2023 FINAL  Final  C Difficile Quick Screen (NO PCR Reflex)     Status: None   Collection Time: 09/03/23  7:37 AM   Specimen:  STOOL  Result Value Ref Range Status   C Diff antigen NEGATIVE NEGATIVE Final   C Diff toxin NEGATIVE NEGATIVE Final   C Diff interpretation No C. difficile detected.  Final    Comment: Performed at [facility], [address] [place], [place] [address]  Gastrointestinal Panel by PCR , Stool     Status: None   Collection Time: 09/03/23  7:54 AM   Specimen: Stool  Result Value Ref Range Status   Campylobacter species NOT DETECTED NOT DETECTED Final   Plesimonas shigelloides NOT DETECTED NOT DETECTED Final   Salmonella species NOT DETECTED NOT DETECTED Final   Yersinia enterocolitica NOT DETECTED NOT DETECTED Final   Vibrio species NOT DETECTED NOT DETECTED Final   Vibrio cholerae NOT DETECTED NOT DETECTED Final   Enteroaggregative E coli (EAEC) NOT DETECTED NOT DETECTED Final   Enteropathogenic E coli (EPEC) NOT DETECTED NOT DETECTED Final   Enterotoxigenic E coli (ETEC) NOT DETECTED NOT DETECTED Final   Shiga like toxin producing E coli (STEC) NOT DETECTED NOT DETECTED Final   Shigella/Enteroinvasive E coli (EIEC) NOT DETECTED NOT DETECTED Final   Cryptosporidium NOT DETECTED NOT DETECTED Final   Cyclospora cayetanensis NOT DETECTED NOT DETECTED Final   Entamoeba histolytica NOT DETECTED NOT DETECTED Final   Giardia lamblia NOT DETECTED NOT DETECTED Final   Adenovirus F40/41 NOT DETECTED NOT DETECTED Final   Astrovirus NOT DETECTED NOT DETECTED Final   Norovirus GI/GII NOT DETECTED NOT DETECTED Final   Rotavirus A NOT DETECTED NOT DETECTED Final   Sapovirus (I, II,  IV, and V) NOT DETECTED NOT DETECTED Final    Comment: Performed at [facility] [address] [place], [place] [address]     Labs: BNP (last 3 results) No results for input(s): "BNP" in the last 8760 hours. Basic Metabolic Panel: Recent Labs  Lab 08/30/23 1720 08/31/23 0806 09/01/23 0748 09/02/23 0522 09/04/23 1037  NA 121* 124* 127* 128* 130*  K 3.6 3.8 3.8 4.1 3.9  CL 89*  94* 95* 94* 97*  CO2 23 25 26 25 23   GLUCOSE 115* 94 104* 105* 136*  BUN 10 7* 9 6* 9  CREATININE 0.67 0.48 0.50 0.53 0.57  CALCIUM  7.3* 7.0* 7.2* 7.4* 7.5*  MG  --  2.1 2.0 1.8 1.9  PHOS  --   --  2.2* 1.5* 2.4*   Liver Function Tests: Recent Labs  Lab 08/30/23 1720 09/01/23 0748 09/04/23 1037  AST 33 18 37  ALT 23 17 28   ALKPHOS 121 140* 427*  BILITOT 0.9 1.2 0.9  PROT 5.9* 4.7* 4.6*  ALBUMIN 2.2* 1.7* 1.7*   No results for input(s): "LIPASE", "AMYLASE" in the last 168 hours. No results for input(s): "AMMONIA" in the last 168 hours. CBC: Recent Labs  Lab 08/30/23 1533 08/30/23 1720 08/31/23 0806 09/01/23 0748 09/02/23 0522 09/04/23 1037  WBC 2.3* 1.7* 1.8* 2.1* 1.5* 2.3*  NEUTROABS 1.2* 1.1*  --  1.4* 0.9* 1.1*  HGB 12.7 13.0 11.8* 11.9* 11.7* 11.5*  HCT 36.1 38.9 35.7* 36.3 34.7* 35.3*  MCV 86.6 89.4 90.6 89.4 89.4 89.8  PLT 207 212 181 166 145* 97*   Cardiac Enzymes: No results for input(s): "CKTOTAL", "CKMB", "CKMBINDEX", "TROPONINI" in the last 168 hours. BNP: Invalid input(s): "POCBNP" CBG: No results for input(s): "GLUCAP" in the last 168 hours. D-Dimer No results for input(s): "DDIMER" in the last 72 hours. Hgb A1c No results for input(s): "HGBA1C" in the last 72 hours. Lipid Profile No results for input(s): "CHOL", "HDL", "LDLCALC", "TRIG", "CHOLHDL", "LDLDIRECT" in the last 72 hours. Thyroid  function studies No results for input(s): "TSH", "T4TOTAL", "T3FREE", "THYROIDAB" in the last 72 hours.  Invalid input(s): "FREET3" Anemia work up No results for input(s): "VITAMINB12", "FOLATE", "FERRITIN", "TIBC", "IRON", "RETICCTPCT" in the last 72 hours. Urinalysis    Component Value Date/Time   COLORURINE STRAW (A) 08/30/2023 2000   APPEARANCEUR CLEAR 08/30/2023 2000   LABSPEC 1.010 08/30/2023 2000   PHURINE 6.0 08/30/2023 2000   GLUCOSEU NEGATIVE 08/30/2023 2000   HGBUR SMALL (A) 08/30/2023 2000   BILIRUBINUR NEGATIVE 08/30/2023 2000   KETONESUR  NEGATIVE 08/30/2023 2000   PROTEINUR NEGATIVE 08/30/2023 2000   NITRITE NEGATIVE 08/30/2023 2000   LEUKOCYTESUR NEGATIVE 08/30/2023 2000   Sepsis Labs Recent Labs  Lab 08/31/23 0806 09/01/23 0748 09/02/23 0522 09/04/23 1037  WBC 1.8* 2.1* 1.5* 2.3*   Microbiology Recent Results (from the past 240 hours)  Blood Culture (routine x 2)     Status: None   Collection Time: 08/30/23  5:20 PM   Specimen: BLOOD LEFT ARM  Result Value Ref Range Status   Specimen Description   Final    BLOOD LEFT ARM Performed at [facility], [address], [place], [place] [address]    Special Requests   Final    BOTTLES DRAWN AEROBIC AND ANAEROBIC Blood Culture adequate volume Performed at [facility], [address], [place], [place] [address]    Culture   Final    NO GROWTH 5 DAYS Performed at [facility], [address], [place], [place] [address]    Report Status 09/04/2023 FINAL  Final  Blood Culture (routine x 2)     Status: None   Collection Time: 08/30/23  6:00 PM   Specimen: BLOOD  Result Value Ref Range Status   Specimen Description   Final    BLOOD SITE NOT SPECIFIED Performed at [facility], [address], [place], [place] [address]    Special Requests   Final    BOTTLES DRAWN AEROBIC AND ANAEROBIC Blood Culture results may not be optimal due to an inadequate volume of blood received in culture bottles Performed at [facility], [address] [place], [place] [address]    Culture   Final    NO GROWTH 5 DAYS Performed at [facility], [address], [place], [place] [address]    Report Status 09/04/2023 FINAL  Final  Resp panel by RT-PCR (RSV, Flu A&B, Covid) Anterior Nasal Swab     Status: None   Collection Time: 08/30/23  6:31 PM   Specimen: Anterior Nasal Swab  Result Value Ref Range Status   SARS Coronavirus 2 by RT PCR NEGATIVE NEGATIVE Final    Comment: (NOTE) SARS-CoV-2 target nucleic  acids are NOT DETECTED.  The SARS-CoV-2 RNA is generally detectable in upper respiratory specimens during the acute phase of infection. The lowest concentration of SARS-CoV-2 viral copies this assay can detect is 138 copies/mL. A negative result does not preclude SARS-Cov-2 infection and should not be used as the sole basis for treatment or other patient management decisions. A negative result may occur with  improper specimen collection/handling, submission of specimen other than nasopharyngeal swab, presence of viral mutation(s) within the areas targeted by this assay, and inadequate number of viral copies(<138 copies/mL). A negative result must be combined with clinical observations, patient history, and epidemiological information. The expected result is Negative.  Fact Sheet for Patients:  BloggerCourse.com  Fact Sheet for Healthcare Providers:  SeriousBroker.it  This test is no t yet approved or cleared by the [place]  FDA and  has been authorized for detection and/or diagnosis of SARS-CoV-2 by FDA under an Emergency Use Authorization (EUA). This EUA will remain  in effect (meaning this test can be used) for the duration of the COVID-19 declaration under Section 564(b)(1) of the Act, 21 U.S.C.section 360bbb-3(b)(1), unless the authorization is terminated  or revoked sooner.       Influenza A by PCR NEGATIVE NEGATIVE Final   Influenza B by PCR NEGATIVE NEGATIVE Final    Comment: (NOTE) The Xpert Xpress SARS-CoV-2/FLU/RSV plus assay is intended as an aid in the diagnosis of influenza from Nasopharyngeal swab specimens and should not be used as a sole basis for treatment. Nasal washings and aspirates are unacceptable for Xpert Xpress SARS-CoV-2/FLU/RSV testing.  Fact Sheet for Patients: BloggerCourse.com  Fact Sheet for Healthcare Providers: SeriousBroker.it  This  test is not yet approved or cleared by the [place]  FDA and has been authorized for detection and/or diagnosis of SARS-CoV-2 by FDA under an Emergency Use Authorization (EUA). This EUA will remain in effect (meaning this test can be used) for the duration of the COVID-19 declaration under Section 564(b)(1) of the Act, 21 U.S.C. section 360bbb-3(b)(1), unless the authorization is terminated or revoked.     Resp Syncytial Virus by PCR NEGATIVE NEGATIVE Final    Comment: (NOTE) Fact Sheet for Patients: BloggerCourse.com  Fact Sheet for Healthcare Providers: SeriousBroker.it  This test is not  yet approved or cleared by the [place]  FDA and has been authorized for detection and/or diagnosis of SARS-CoV-2 by FDA under an Emergency Use Authorization (EUA). This EUA will remain in effect (meaning this test can be used) for the duration of the COVID-19 declaration under Section 564(b)(1) of the Act, 21 U.S.C. section 360bbb-3(b)(1), unless the authorization is terminated or revoked.  Performed at [facility], [address] [place], [place] [address]   Urine Culture     Status: Abnormal   Collection Time: 08/30/23  8:00 PM   Specimen: Urine, Clean Catch  Result Value Ref Range Status   Specimen Description   Final    URINE, CLEAN CATCH Performed at [facility], [address], [place], [place] [address]    Special Requests   Final    NONE Performed at [facility], [address] [place], [place] [address]    Culture MULTIPLE SPECIES PRESENT, SUGGEST RECOLLECTION (A)  Final   Report Status 08/31/2023 FINAL  Final  C Difficile Quick Screen (NO PCR Reflex)     Status: None   Collection Time: 09/03/23  7:37 AM   Specimen: STOOL  Result Value Ref Range Status   C Diff antigen NEGATIVE NEGATIVE Final   C Diff toxin NEGATIVE NEGATIVE Final   C Diff interpretation No  C. difficile detected.  Final    Comment: Performed at [facility], [address] [place], [place] [address]  Gastrointestinal Panel by PCR , Stool     Status: None   Collection Time: 09/03/23  7:54 AM   Specimen: Stool  Result Value Ref Range Status   Campylobacter species NOT DETECTED NOT DETECTED Final   Plesimonas shigelloides NOT DETECTED NOT DETECTED Final   Salmonella species NOT DETECTED NOT DETECTED Final   Yersinia enterocolitica NOT DETECTED NOT DETECTED Final   Vibrio species NOT DETECTED NOT DETECTED Final   Vibrio cholerae NOT DETECTED NOT DETECTED Final   Enteroaggregative E coli (EAEC) NOT DETECTED NOT DETECTED Final   Enteropathogenic E coli (EPEC) NOT DETECTED NOT DETECTED Final   Enterotoxigenic E coli (ETEC) NOT DETECTED NOT DETECTED Final   Shiga like toxin producing E coli (STEC) NOT DETECTED NOT DETECTED Final   Shigella/Enteroinvasive E coli (EIEC) NOT DETECTED NOT DETECTED Final   Cryptosporidium NOT DETECTED NOT DETECTED Final   Cyclospora cayetanensis NOT DETECTED NOT DETECTED Final   Entamoeba histolytica NOT DETECTED NOT DETECTED Final   Giardia lamblia NOT DETECTED NOT DETECTED Final   Adenovirus F40/41 NOT DETECTED NOT DETECTED Final   Astrovirus NOT DETECTED NOT DETECTED Final   Norovirus GI/GII NOT DETECTED NOT DETECTED Final   Rotavirus A NOT DETECTED NOT DETECTED Final   Sapovirus (I, II, IV, and V) NOT DETECTED NOT DETECTED Final    Comment: Performed at [facility], [address] [place], [place] [address]     Time coordinating discharge: 40 minutes  SIGNED:   Vada Garibaldi, MD  [facility] 09/05/2023, 12:54 PM

## 2023-09-05 NOTE — Plan of Care (Signed)

## 2023-09-05 NOTE — Plan of Care (Signed)

## 2023-09-06 ENCOUNTER — Telehealth: Payer: Self-pay

## 2023-09-06 ENCOUNTER — Other Ambulatory Visit (HOSPITAL_COMMUNITY): Payer: Self-pay

## 2023-09-06 ENCOUNTER — Encounter: Payer: Self-pay | Admitting: Oncology

## 2023-09-06 NOTE — Transitions of Care (Post Inpatient/ED Visit) (Signed)
   09/06/2023  Name: Rebecca [name] MRN: [id number] DOB: [birth year]  Today's TOC FU Call Status: Today's TOC FU Call Status:: Unsuccessful Call (1st Attempt) Unsuccessful Call (1st Attempt) Date: 09/06/23  Attempted to reach the patient regarding the most recent Inpatient/ED visit.  Follow Up Plan: Additional outreach attempts will be made to reach the patient to complete the Transitions of Care (Post Inpatient/ED visit) call.   Tonia Frankel RN, CCM [place]  VBCI-Population Health RN Care Manager [phone]

## 2023-09-09 ENCOUNTER — Other Ambulatory Visit: Payer: Self-pay

## 2023-09-09 ENCOUNTER — Encounter (HOSPITAL_BASED_OUTPATIENT_CLINIC_OR_DEPARTMENT_OTHER): Payer: Self-pay | Admitting: Emergency Medicine

## 2023-09-09 ENCOUNTER — Inpatient Hospital Stay: Admitting: Family

## 2023-09-09 ENCOUNTER — Emergency Department (HOSPITAL_BASED_OUTPATIENT_CLINIC_OR_DEPARTMENT_OTHER)
Admission: EM | Admit: 2023-09-09 | Discharge: 2023-09-09 | Disposition: A | Discharge status: Home / Self Care | Attending: Emergency Medicine | Admitting: Emergency Medicine

## 2023-09-09 ENCOUNTER — Telehealth: Payer: Self-pay

## 2023-09-09 DIAGNOSIS — B379 Candidiasis, unspecified: Secondary | ICD-10-CM | POA: Diagnosis present

## 2023-09-09 DIAGNOSIS — B370 Candidal stomatitis: Secondary | ICD-10-CM

## 2023-09-09 DIAGNOSIS — J45909 Unspecified asthma, uncomplicated: Secondary | ICD-10-CM | POA: Diagnosis not present

## 2023-09-09 DIAGNOSIS — R197 Diarrhea, unspecified: Secondary | ICD-10-CM | POA: Diagnosis not present

## 2023-09-09 DIAGNOSIS — Z85048 Personal history of other malignant neoplasm of rectum, rectosigmoid junction, and anus: Secondary | ICD-10-CM | POA: Diagnosis not present

## 2023-09-09 MED ORDER — NYSTATIN 100000 UNIT/ML MT SUSP: 5.0000 mL | Freq: Four times a day (QID) | OROMUCOSAL | 0 refills | Status: DC

## 2023-09-09 NOTE — ED Triage Notes (Signed)
 Pt from cancer center upstairs caox4 reporting she attempted to be seen upstairs for re-eval from hospital discharge and was recommended eval in ED d/t c/o 2 episodes diarrhea and thrush. Discharged from [facility] Thurs after being tx for partial bowel obstruction.

## 2023-09-09 NOTE — ED Provider Notes (Signed)
 [place] EMERGENCY DEPARTMENT AT [facility] Provider Note   CSN: [id number] Arrival date & time: 09/09/23  0827     History  Chief Complaint  Patient presents with   Diarrhea    Rebecca [name] is a 73 y.o. female.   Diarrhea   73 year old female presents emergency department with complaints of thrush, 2 episodes of loose bowel movements.  Was attempting to go to the cancer center for follow-up regarding her recent hospitalization.  Was admitted 08/30/2023 due to neutropenic fever as well as CT scan concerning for infectious enteritis with possible transition point.  Patient states she has been doing well since most recent discharge on 09/05/2023.  States that her appetite is better has been able to tolerate more fluids as well as has been more physically active walking around her neighborhood.  She has reported 2 episodes of loose bowel movements over the past 4 to 5 days that have been nonbloody nonmelanotic in appearance.  States that this is improved since she was admitted.  Daughter noticed white patches in her mouth a couple days ago and is concerned about possible thrush.  Patient does report history of mouth sores related to chemo therapy but is only having very mild mouth and throat discomfort.  Patient is with history of anorectal cancer with most recent radiation therapy 08/29/2023 most recent chemotherapy 08/19/2023.  Patient denies any fevers, chills, abdominal pain, vomiting, nausea, chest pain, shortness of breath, urinary symptoms.  States that her rectal pain has been gradually improving since prior radiation therapy.  Past medical history significant for anorectal cancer squamous cell, hyperlipidemia, asthma, anemia, allergies  Home Medications Prior to Admission medications   Medication Sig Start Date End Date Taking? Authorizing Provider  acetaminophen  (TYLENOL ) 325 MG tablet Take 2 tablets (650 mg total) by mouth every 6 (six) hours as needed for mild pain (pain  score 1-3) or fever (or Fever >/= 101). 09/05/23   Vada Garibaldi, MD  albuterol  (VENTOLIN  HFA) 108 (90 Base) MCG/ACT inhaler Inhale 1 puff into the lungs every 6 (six) hours as needed for wheezing or shortness of breath. 09/05/22   Ngetich, Dinah C, NP  budesonide -formoterol  (SYMBICORT ) 80-4.5 MCG/ACT inhaler TAKE 2 PUFFS BY MOUTH TWICE A [place] Patient taking differently: Inhale 2 puffs into the lungs 2 (two) times daily as needed (for respiratory flares). 07/02/23   Ngetich, Dinah C, NP  dexamethasone  (DECADRON ) 4 MG tablet Take 1 tablet (4 mg total) by mouth daily for 4 days. 09/05/23 09/09/23  Vada Garibaldi, MD  estradiol  (ESTRACE ) 0.1 MG/GM vaginal cream Place 1 Applicatorful vaginally See admin instructions. Apply as directed to the labial area in the morning and at bedtime    [provider]  fexofenadine  (ALLEGRA ) 180 MG tablet Take 1 tablet (180 mg total) by mouth daily. Patient taking differently: Take 180 mg by mouth every evening. 07/10/17   Verma Gobble, NP  magic mouthwash SOLN Take 5 mLs by mouth 4 (four) times daily as needed for mouth pain. Patient not taking: Reported on 08/30/2023 07/30/23   Sumner Ends, MD  Nystatin ([name]'S BUTT CREAM) CREA Apply 1 Application topically 3 (three) times daily. 09/05/23   Vada Garibaldi, MD  omeprazole  (PRILOSEC) 20 MG capsule Take 1 capsule (20 mg total) by mouth daily. Patient taking differently: Take 20 mg by mouth daily before breakfast. 08/26/23 09/25/23  Afton Horse T, DO  ondansetron  (ZOFRAN ) 8 MG tablet Take 1 tablet (8 mg total) by mouth every 8 (eight) hours  as needed for nausea or vomiting. 09/05/23   Vada Garibaldi, MD  oxyCODONE  (ROXICODONE ) 5 MG immediate release tablet Take 1 tablet (5 mg total) by mouth every 6 (six) hours as needed for up to 5 days for moderate pain (pain score 4-6) or severe pain (pain score 7-10). 09/05/23 09/10/23  Vada Garibaldi, MD  phosphorus (K PHOS  NEUTRAL) 155-852-130 MG tablet Take 1 tablet (250 mg  total) by mouth 3 (three) times daily for 7 days. 09/05/23 09/12/23  Ghimire, Kuber, MD  prochlorperazine  (COMPAZINE ) 10 MG tablet Take 1 tablet (10 mg total) by mouth every 6 (six) hours as needed for nausea or vomiting. 06/28/23   Roseline Conine, NP  rosuvastatin  (CRESTOR ) 5 MG tablet TAKE 1 TABLET BY MOUTH ONCE DAILY Patient taking differently: Take 5 mg by mouth every evening. 04/26/23   Ngetich, Dinah C, NP  silver  sulfADIAZINE  (SILVADENE ) 1 % cream Apply 1 Application topically See admin instructions. Apply as directed to the labial area in the morning and at bedtime    [provider]      Allergies    Other    Review of Systems   Review of Systems  Gastrointestinal:  Positive for diarrhea.  All other systems reviewed and are negative.   Physical Exam Updated Vital Signs BP 103/62   Pulse 84   Temp 98.4 F (36.9 C)   Resp 15   SpO2 99%  Physical Exam Vitals and nursing note reviewed.  Constitutional:      General: She is not in acute distress.    Appearance: She is well-developed.     Comments: Frail/cachectic appearing.  HENT:     Head: Normocephalic and atraumatic.     Mouth/Throat:     Comments: White patches appreciated on tongue, posterior pharynx.  Able to be scraped off. Eyes:     Conjunctiva/sclera: Conjunctivae normal.  Cardiovascular:     Rate and Rhythm: Normal rate and regular rhythm.  Pulmonary:     Effort: Pulmonary effort is normal. No respiratory distress.     Breath sounds: Normal breath sounds. No wheezing, rhonchi or rales.  Abdominal:     Palpations: Abdomen is soft.     Tenderness: There is no abdominal tenderness. There is no guarding.  Musculoskeletal:        General: No swelling.     Cervical back: Neck supple.  Skin:    General: Skin is warm and dry.     Capillary Refill: Capillary refill takes less than 2 seconds.  Neurological:     Mental Status: She is alert.  Psychiatric:        Mood and Affect: Mood normal.     ED  Results / Procedures / Treatments   Labs (all labs ordered are listed, but only abnormal results are displayed) Labs Reviewed  LIPASE, BLOOD  COMPREHENSIVE METABOLIC PANEL WITH GFR  URINALYSIS, ROUTINE W REFLEX MICROSCOPIC  CBC WITH DIFFERENTIAL/PLATELET  MAGNESIUM    EKG None  Radiology No results found.  Procedures Procedures    Medications Ordered in ED Medications - No data to display  ED Course/ Medical Decision Making/ A&P                                 Medical Decision Making Amount and/or Complexity of Data Reviewed Labs: ordered.  Risk Prescription drug management.   This patient presents to the ED for concern of white patches on throat/mouth,  this involves an extensive number of treatment options, and is a complaint that carries with it a high risk of complications and morbidity.  The differential diagnosis includes malignancy, medication side effect, leukopenia, neutropenic fever, oral candidiasis, other   Co morbidities that complicate the patient evaluation  See HPI   Additional history obtained:  Additional history obtained from EMR External records from outside source obtained and reviewed including hospital records   Lab Tests:  Patient declined  Imaging Studies ordered:  N/a   Cardiac Monitoring: / EKG:  The patient was maintained on a cardiac monitor.  I personally viewed and interpreted the cardiac monitored which showed an underlying rhythm of: sinus rhythm   Consultations Obtained:  I requested consultation with attending physician Dr. Adrain Alar who is in agreement with treatment plan going forward.   Problem List / ED Course / Critical interventions / Medication management  Oral candidiasis Reevaluation of the patient showed that the patient stayed the same I have reviewed the patients home medicines and have made adjustments as needed   Social Determinants of Health:  Denies tobacco, licit drug use.   Test /  Admission - Considered:  Oral candidiasis Vitals signs within normal range and stable throughout visit. Laboratory/imaging studies significant for: See above 73 year old female presents emergency department with complaints of thrush, 2 episodes of loose bowel movements.  Was attempting to go to the cancer center for follow-up regarding her recent hospitalization.  Was admitted 08/30/2023 due to neutropenic fever as well as CT scan concerning for infectious enteritis with possible transition point.  Patient states she has been doing well since most recent discharge on 09/05/2023.  States that her appetite is better has been able to tolerate more fluids as well as has been more physically active walking around her neighborhood.  She has reported 2 episodes of loose bowel movements over the past 4 to 5 days that have been nonbloody nonmelanotic in appearance.  States that this is improved since she was admitted.  Daughter noticed white patches in her mouth a couple days ago and is concerned about possible thrush.  Patient does report history of mouth sores related to chemo therapy but is only having very mild mouth and throat discomfort.  Patient is with history of anorectal cancer with most recent radiation therapy 08/29/2023 most recent chemotherapy 08/19/2023.  Patient denies any fevers, chills, abdominal pain, vomiting, nausea, chest pain, shortness of breath, urinary symptoms.  States that her rectal pain has been gradually improving since prior radiation therapy. On exam, lungs clear to auscultation bilaterally.  Abdomen nontender.  Patient with oropharyngeal exam consistent with oral candidiasis as above.  Initially, had basic labs ordered for comparison from prior hospital discharge.  Patient was tried multiple times by nursing staff to obtain labs without success.  Discussion was had x 2 with patient regarding IV ultrasound to obtain labs but she declined.  States that she would just like to be treated for  presumed thrush and follow-up with oncology in the outpatient setting.  Given the patient's describes her symptoms as overall significantly improving from prior hospitalization as described in HPI, we we will discharge patient instead of AMA as patient is declining labs but overall is very well-appearing and seems to be continuing to improve.  Will recommend close follow-up with PCP/oncology in the outpatient setting.  Will treat empirically for a oral candidiasis.  Treatment plan discussed with patient and family and they acknowledge understanding were agreeable to said plan.  Patient overall well-appearing,  afebrile in no acute distress. Worrisome signs and symptoms were discussed with the patient, and the patient acknowledged understanding to return to the ED if noticed. Patient was stable upon discharge.          Final Clinical Impression(s) / ED Diagnoses Final diagnoses:  None    Rx / DC Orders ED Discharge Orders     None         [place] Butter, [place] 09/09/23 1115    Rosealee Concha, MD 09/09/23 1257

## 2023-09-09 NOTE — Discharge Instructions (Addendum)
 As discussed, we will treat you for thrush with nystatin.  Recommend close follow-up with primary care/oncology.  Please not hesitate to return to the emergency department if the worrisome signs and symptoms we discussed become apparent.

## 2023-09-09 NOTE — Transitions of Care (Post Inpatient/ED Visit) (Signed)
   09/09/2023  Name: Rebecca [name] MRN: [id number] DOB: 09/19/1950  Today's TOC FU Call Status: Today's TOC FU Call Status:: Unsuccessful Call (2nd Attempt) Unsuccessful Call (2nd Attempt) Date: 09/09/23  Attempted to reach the patient regarding the most recent Inpatient/ED visit.  Follow Up Plan: Additional outreach attempts will be made to reach the patient to complete the Transitions of Care (Post Inpatient/ED visit) call.   Tonia Frankel RN, CCM [place]  [facility]Population Health RN Care Manager [phone]

## 2023-09-09 NOTE — ED Notes (Signed)
Discharge instructions, follow up care, and prescription reviewed and explained, pt verbalized understanding and had no further questions on d/c.  

## 2023-09-10 ENCOUNTER — Telehealth: Payer: Self-pay

## 2023-09-10 NOTE — Transitions of Care (Post Inpatient/ED Visit) (Signed)
   09/10/2023  Name: Rebecca [name] MRN: [id number] DOB: 25-Jan-1951  Today's TOC FU Call Status: Today's TOC FU Call Status:: Unsuccessful Call (3rd Attempt) Unsuccessful Call (3rd Attempt) Date: 09/10/23  Attempted to reach the patient regarding the most recent Inpatient/ED visit.  Follow Up Plan: No further outreach attempts will be made at this time. We have been unable to contact the patient.  Tonia Frankel RN, CCM [place]  VBCI-Population Health RN Care Manager [phone]

## 2023-09-12 ENCOUNTER — Ambulatory Visit (INDEPENDENT_AMBULATORY_CARE_PROVIDER_SITE_OTHER): Admitting: Family

## 2023-09-12 VITALS — BP 96/54 | HR 80 | Temp 98.0°F | Ht 64.0 in | Wt 89.0 lb

## 2023-09-12 DIAGNOSIS — R634 Abnormal weight loss: Secondary | ICD-10-CM

## 2023-09-12 DIAGNOSIS — E43 Unspecified severe protein-calorie malnutrition: Secondary | ICD-10-CM | POA: Diagnosis not present

## 2023-09-12 DIAGNOSIS — R82998 Other abnormal findings in urine: Secondary | ICD-10-CM

## 2023-09-12 DIAGNOSIS — J4540 Moderate persistent asthma, uncomplicated: Secondary | ICD-10-CM | POA: Diagnosis not present

## 2023-09-12 DIAGNOSIS — E785 Hyperlipidemia, unspecified: Secondary | ICD-10-CM | POA: Diagnosis not present

## 2023-09-12 DIAGNOSIS — M810 Age-related osteoporosis without current pathological fracture: Secondary | ICD-10-CM | POA: Diagnosis not present

## 2023-09-12 DIAGNOSIS — C210 Malignant neoplasm of anus, unspecified: Secondary | ICD-10-CM

## 2023-09-12 DIAGNOSIS — R748 Abnormal levels of other serum enzymes: Secondary | ICD-10-CM | POA: Diagnosis not present

## 2023-09-12 MED ORDER — MIRTAZAPINE 7.5 MG PO TABS: 7.5000 mg | ORAL_TABLET | Freq: Every day | ORAL | 3 refills | Status: DC

## 2023-09-12 MED ORDER — NYSTATIN 100000 UNIT/ML MT SUSP: 5.0000 mL | Freq: Four times a day (QID) | OROMUCOSAL | 0 refills | Status: DC

## 2023-09-13 ENCOUNTER — Encounter: Payer: Self-pay | Admitting: *Deleted

## 2023-09-13 ENCOUNTER — Encounter: Payer: Self-pay | Admitting: Oncology

## 2023-09-13 ENCOUNTER — Ambulatory Visit: Payer: Self-pay | Admitting: Family

## 2023-09-13 NOTE — Telephone Encounter (Signed)
Message routed to PCP Ngetich, Dinah C, NP  

## 2023-09-13 NOTE — Progress Notes (Signed)
 PATIENT NAVIGATOR PROGRESS NOTE  Name: Rebecca [name] Date: 09/13/2023 MRN: [id number]  DOB: 16-Jan-1951   Reason for visit:  F/U call  Comments:  Called and spoke with pt who states that she is improving since recent hospitalization and ER visit. She is using Magic Mouthwash for thrush and it seems to be improving. She saw her PCP yesterday who prescribed an appetite stimulant. She states that it makes her drowsy but feels that it is helping. She is scheduled for F/U here on Tuesday. Encouraged her to call with any issues.    Time spent counseling/coordinating care: 30-45 minutes

## 2023-09-16 ENCOUNTER — Ambulatory Visit: Admitting: Nutrition

## 2023-09-16 ENCOUNTER — Telehealth: Payer: Self-pay | Admitting: Radiation Oncology

## 2023-09-16 ENCOUNTER — Encounter: Admitting: Nutrition

## 2023-09-16 NOTE — Progress Notes (Signed)
 Contacted patient by telephone for nutrition follow up S/p hospitalization. She was identified on MST report due to weight loss and poor appetite. Diagnosed with severe malnutrition during hospitalization.  She has completed treatment. Weight decreased to 89 pounds on June 12 from 100 pounds 6.4 oz on March 5.  She had mouth sores and thrush which made it very difficult to eat. Reports increased appetite. She is drinking Ensure complete now and has a good supply of it. Declines need for coupons or samples at this time. She states she is doing much better and has been able to eat more. No concerns voiced at this time. Encouraged her to reach out with questions or concerns.

## 2023-09-16 NOTE — Telephone Encounter (Signed)
 Received securechat about pt being interested in changing providers. Providers were notified and advised that at this time pt has completed tx and does not require any additional care from our team. Pt's case will be transitioned to colorectal surgery for local surveillance of her disease, follow with pelvic floor PT and med onc. Pt was advised appt scheduled for 6/30 is a telephone appt that is completed by nursing staff and does not involve provider unless there is any additional concerns that require escalation. If this is needed, nursing team will contact providers to make sure transfer of care is approved. Pt was also advised that if she does require any additional treatments in the future, we would consult with providers to approve transition of care which is usually approved. Pt was satisfied with all this information and is comfortable keeping the 6/30 appt at this time.

## 2023-09-16 NOTE — Telephone Encounter (Signed)
 The office closed at 3pm on Friday I was unable to attend to this message. I will see if Rebecca [name] can add lab this morning. Message routed back to Dinah, NP and Clinical Intake Chrae.B/CMA for today as FYI.

## 2023-09-16 NOTE — Telephone Encounter (Signed)
 Ngetich, Elijio [place], NP to Me  (Selected Message)     09/13/23  4:30 PM Please add Hepatic Panel to current lab results.

## 2023-09-17 ENCOUNTER — Inpatient Hospital Stay: Admitting: Nurse Practitioner

## 2023-09-17 ENCOUNTER — Telehealth: Payer: Self-pay | Admitting: *Deleted

## 2023-09-17 ENCOUNTER — Inpatient Hospital Stay: Attending: Oncology

## 2023-09-17 ENCOUNTER — Encounter: Payer: Self-pay | Admitting: Nurse Practitioner

## 2023-09-17 ENCOUNTER — Encounter: Payer: Self-pay | Admitting: Oncology

## 2023-09-17 ENCOUNTER — Inpatient Hospital Stay

## 2023-09-17 VITALS — BP 108/68 | HR 101 | Temp 97.9°F | Resp 18 | Ht 64.0 in | Wt 86.0 lb

## 2023-09-17 DIAGNOSIS — C218 Malignant neoplasm of overlapping sites of rectum, anus and anal canal: Secondary | ICD-10-CM | POA: Diagnosis not present

## 2023-09-17 DIAGNOSIS — C210 Malignant neoplasm of anus, unspecified: Secondary | ICD-10-CM | POA: Diagnosis not present

## 2023-09-17 DIAGNOSIS — J45909 Unspecified asthma, uncomplicated: Secondary | ICD-10-CM | POA: Insufficient documentation

## 2023-09-17 DIAGNOSIS — M810 Age-related osteoporosis without current pathological fracture: Secondary | ICD-10-CM | POA: Diagnosis not present

## 2023-09-17 NOTE — Telephone Encounter (Signed)
 Informed husband that Rebecca Gram, NP has corresponded with radiation oncology and Dr. Alana Hoyle PA, Rebecca [name] will be seeing her in follow-up in regards to her radiation related problems. Referral has also been sent to [facility] for new medical oncolgist w/female preference. We will let them know when we have an update.

## 2023-09-17 NOTE — Progress Notes (Signed)
 [facility] OFFICE PROGRESS NOTE   Diagnosis: Anal cancer  INTERVAL HISTORY:   Rebecca [name] returns as scheduled.  She completed cycle two 5-FU/Mitomycin -C beginning 08/19/2023.  She completed the course of radiation 08/29/2023.  She was seen in the office in an unscheduled visit on 08/30/2023, found to have febrile neutropenia.  She was hospitalized 08/30/2023 to 09/05/2023.  Abdominal CT showed moderate length segment of circumferential wall thickening inflammation of the distal ileum/terminal ileum, transition point for small bowel obstruction.  Findings concerning for infectious or inflammatory enteritis.  Stomach was distended with fluid in the distal esophagus.  Mild edema and inflammation in the region of the anus.  GI panel negative, C. difficile negative.  Blood cultures remained negative.  Symptoms gradually improved and her diet was advanced.    Overall she is feeling better.  Skin is healing.  She is no longer having diarrhea.  She has a good appetite.  No nausea or vomiting.  No fever.  No mouth sores.  She is fatigued.  Objective:  Vital signs in last 24 hours:  Blood pressure 108/68, pulse (!) 101, temperature 97.9 F (36.6 C), temperature source Temporal, resp. rate 18, height 5' 4 (1.626 m), weight 86 lb (39 kg), SpO2 100%.    HEENT: No thrush or ulcers. Resp: Lungs clear bilaterally. Cardio: Regular rate and rhythm. GI: Abdomen soft and nontender.  No hepatosplenomegaly. Vascular: No leg edema. Skin: Skin is healing at the bilateral groin regions, perineum, perianal skin.  Superficial ulceration right buttock.   Lab Results:  Lab Results  Component Value Date   WBC 2.3 (L) 09/04/2023   HGB 11.5 (L) 09/04/2023   HCT 35.3 (L) 09/04/2023   MCV 89.8 09/04/2023   PLT 97 (L) 09/04/2023   NEUTROABS 1.1 (L) 09/04/2023    Imaging:  No results found.  Medications: I have reviewed the patient's current medications.  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  High-grade dysplasia is present at the anterior and posterior margins, distally Radiation 07/22/2023-08/29/2023 Cycle one 5-FU/mitomycin  07/22/2023 Cycle two 5-FU/mitomycin  08/19/2023, dose reduced due to mucositis following cycle 1 Colon polyps on colonoscopy 05/06/2023: Sessile serrated polyps and tubular adenoma Colonoscopy 08/21/2017: Sessile serrated polyps at the ascending and hepatic flexure, biopsy of polypoid mucosa at the distal rectum near the dentate line-benign polypoid mucosa with a lymphoid aggregate 3.   Osteoporosis 4.   Asthma 5.  Vesicular lip/external nose lesions on exam 05/16/2023 6.  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   Admission 08/30/2023 - 09/05/2023 due to febrile neutropenia, diarrhea, dehydration  Disposition: Rebecca [name] appears stable.  She continues to recover from chemotherapy and radiation therapy as well as the subsequent hospitalization.  She has follow-up scheduled with Rebecca [name] for long-term anal surveillance.  She and her husband request her follow-up care here be transferred to Rebecca [name] in radiation oncology.  We have messaged the radiation oncology team with this request.  If this is unable to be arranged they  would like her care be transferred to medical oncology at [facility].  In the interim they are agreeable to continued follow-up with myself and Rebecca [name].  We recommended completing labs today to include a CBC and chemistry panel.  She declines to have labs drawn today.  Nutrition referral placed due to continued weight loss.  She will return for follow-up in about 10 days.  We are available to see her sooner if needed.  Patient seen with Rebecca [name].   Rebecca [name]   09/17/2023  10:47 AM  Addendum 12:17 PM-I have corresponded with Rebecca Learned, Rebecca [name] with Rebecca [name].  They will see her in follow-up if she is having radiation related problems.  Transfer of care to medical oncology at [facility] has been requested.   This was a shared visit with Rebecca [name].  Rebecca [name] was interviewed and examined.  She was admitted with febrile neutropenia and diarrhea following the second cycle of 5-FU/Mitomycin -C.  She reports that she did not develop mouth sores following the second cycle of chemotherapy.  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  Perineal skin breakdown appears to be healing.  We are hopeful her performance status will continue to improve over the next few weeks.  She requested a transfer of her medical oncology care to the [facility] cancer center.  She will follow-up with Rebecca [name] for annual surveillance.  Rebecca Kerns, MD

## 2023-09-19 ENCOUNTER — Encounter: Payer: Self-pay | Admitting: General Practice

## 2023-09-19 NOTE — Addendum Note (Signed)
 Encounter addended by: Karolynn Pack on: 09/19/2023 12:39 PM  Actions taken: Imaging Exam ended

## 2023-09-19 NOTE — Progress Notes (Signed)
 [facility] Spiritual Care Note  Attempted follow-up call to Rebecca [name], reaching her husband Rebecca [name] because his number is now listed as primary. (Per Rebecca [name], they made this decision because Rebecca [name] was getting confused with all of the appointment calls.)  Rebecca [name] shared that Rebecca [name] was able to walk all the way around the block today (twice as far as usual) and that she seems to be eating more, though he is concerned about continued weight loss. He also noted that Rebecca [name] is meeting with a dear friend group, [employer], via Zoom this morning, which will be a spiritual and emotional boost. He plans to let Rebecca [name] know of chaplain's call and availability, encouraging return call.   [address] Rebecca [name], [place], [facility] Pager [phone] Voicemail [phone]

## 2023-09-20 NOTE — Progress Notes (Signed)
 Provider: Christean Courts FNP-C   Jayle Solarz, Elijio Guadeloupe, NP  Patient Care Team: Tracie Dore, Elijio Guadeloupe, NP as PCP - General (Family Medicine) Verma Gobble, NP (Geriatric Medicine) Rosslyn Coons, RN as Oncology Nurse Navigator  Extended Emergency Contact Information Primary Emergency Contact: Coolidge,[name] Address: [address]          [place], [place] [address] [place] Home Phone: [phone] Mobile Phone: [phone] Relation: Spouse Secondary Emergency Contact: Diekmann,[name] Home Phone: [phone] Relation: Brother  Code Status:  Full Code  Goals of care: Advanced Directive information    08/30/2023   10:00 PM  Advanced Directives  Does Patient Have a Medical Advance Directive? Yes  Type of Advance Directive Living will;Healthcare Power of Attorney  Does patient want to make changes to medical advance directive? No - Patient declined  Copy of Healthcare Power of Attorney in Chart? No - copy requested     Chief Complaint  Patient presents with   Medical Management of Chronic Issues    Routine visit. Follow up on anal cancer.    Discussed the use of AI scribe software for clinical note transcription with the patient, who gave verbal consent to proceed.  History of Present Illness   The patient, with anal cancer, presents for a six-month follow-up after treatment.  She underwent a colonoscopy and surgery for anal cancer, followed by six weeks of radiation therapy. Chemotherapy was administered during the first and last weeks of her treatment regimen.  She has experienced significant weight loss, dropping from 102 pounds to 89 pounds, with a rapid loss of 5 to 7 pounds in the last week to ten days while hospitalized. Her appetite is poor, and she is eating only small amounts, although she notes it is 'getting better.'  She was seen in the emergency room on June 9th for oral thrush, which was diagnosed at that time. She has been using nystatin  for  treatment but has run out of the medication. No nausea or vomiting. Bowel movements are regular, with no constipation or diarrhea, and urination is normal.  Her current medications include Tylenol  1000 mg as needed, and she has completed her course of K-Phos Neutral. She is using nystatin  for oral thrush, which she describes as 'magic mouthwash.'  She has a history of nausea since February 3rd, which has impacted her ability to eat, although this has improved somewhat. She is currently eating small portions, described as 'ten bites of this.'  Recent lab work shows low white blood cell count, low hemoglobin, and low platelet count, which fluctuate due to chemotherapy. Magnesium levels are normal, but phosphorus and sodium levels were previously low and have improved. Total protein and albumin levels are low. She does not consume eggs but does eat chicken and cheese.   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)    Past Surgical History:  Procedure Laterality Date   COLONOSCOPY  07/2017   JMP-MAC-goly(good)-tics/SSP x 1   OPEN REDUCTION INTERNAL FIXATION (ORIF) DISTAL RADIAL FRACTURE Left 01/04/2021   Procedure: OPEN REDUCTION INTERNAL FIXATION (ORIF) DISTAL RADIAL FRACTURE;  Surgeon: Laneta Pintos, MD;  Location: [facility] OR;  Service: Orthopedics;  Laterality: Left;   ORIF TIBIA PLATEAU Right 03/16/2020   Procedure: OPEN REDUCTION INTERNAL FIXATION (  ORIF) TIBIAL PLATEAU;  Surgeon: Laneta Pintos, MD;  Location: [facility] OR;  Service: Orthopedics;  Laterality: Right;   POLYPECTOMY  2019   SSP x 1   TRANSANAL EXCISION OF RECTAL MASS N/A 06/05/2023   Procedure: TRANSANAL EXCISION ANAL LESION;  Surgeon: Joyce Nixon, MD;  Location: [facility];  Service: General;  Laterality: N/A;   WISDOM TOOTH EXTRACTION      Allergies  Allergen Reactions    Other Itching and Other (See Comments)    Pollen and cat & dog dander: Itchy eyes, asthma is triggered, nasal congestion    Allergies as of 09/12/2023       Reactions   Other Itching, Other (See Comments)   Pollen and cat & dog dander: Itchy eyes, asthma is triggered, nasal congestion        Medication List        Accurate as of September 12, 2023 11:59 PM. If you have any questions, ask your nurse or doctor.          STOP taking these medications    phosphorus [id number] MG tablet Commonly known as: K PHOS  NEUTRAL   silver  sulfADIAZINE  1 % cream Commonly known as: SILVADENE        TAKE these medications    acetaminophen  500 MG tablet Commonly known as: TYLENOL  Take 1,000 mg by mouth every 6 (six) hours as needed. What changed: Another medication with the same name was removed. Continue taking this medication, and follow the directions you see here.   albuterol  108 (90 Base) MCG/ACT inhaler Commonly known as: VENTOLIN  HFA Inhale 1 puff into the lungs every 6 (six) hours as needed for wheezing or shortness of breath.   budesonide -formoterol  80-4.5 MCG/ACT inhaler Commonly known as: Symbicort  TAKE 2 PUFFS BY MOUTH TWICE A Sherrow   estradiol  0.1 MG/GM vaginal cream Commonly known as: ESTRACE  Place 1 Applicatorful vaginally See admin instructions. Apply as directed to the labial area in the morning and at bedtime   fexofenadine  180 MG tablet Commonly known as: ALLEGRA  Take 1 tablet (180 mg total) by mouth daily. What changed:  when to take this reasons to take this   [name]'s butt cream Crea Apply 1 Application topically 3 (three) times daily.   magic mouthwash Soln Take 5 mLs by mouth 4 (four) times daily as needed for mouth pain.   mirtazapine  7.5 MG tablet Commonly known as: REMERON  Take 1 tablet (7.5 mg total) by mouth at bedtime.   nystatin  100000 UNIT/ML suspension Commonly known as: MYCOSTATIN  Take 5 mLs (500,000 Units total) by mouth 4 (four) times  daily.   omeprazole  20 MG capsule Commonly known as: PRILOSEC Take 1 capsule (20 mg total) by mouth daily.   ondansetron  8 MG tablet Commonly known as: ZOFRAN  Take 1 tablet (8 mg total) by mouth every 8 (eight) hours as needed for nausea or vomiting.   prochlorperazine  10 MG tablet Commonly known as: COMPAZINE  Take 1 tablet (10 mg total) by mouth every 6 (six) hours as needed for nausea or vomiting.   rosuvastatin  5 MG tablet Commonly known as: CRESTOR  TAKE 1 TABLET BY MOUTH ONCE DAILY        Review of Systems  Constitutional:  Positive for appetite change and unexpected weight change. Negative for chills, fatigue and fever.  HENT:  Negative for congestion, dental problem, ear discharge, ear pain, facial swelling, hearing loss, nosebleeds, postnasal drip, rhinorrhea, sinus pressure, sinus pain, sneezing, sore throat, tinnitus and trouble swallowing.  Whitish patches in the mouth   Eyes:  Negative for pain, discharge, redness, itching and visual disturbance.  Respiratory:  Negative for cough, chest tightness, shortness of breath and wheezing.   Cardiovascular:  Negative for chest pain, palpitations and leg swelling.  Gastrointestinal:  Negative for abdominal distention, abdominal pain, blood in stool, constipation, diarrhea, nausea and vomiting.  Endocrine: Negative for cold intolerance, heat intolerance, polydipsia, polyphagia and polyuria.  Genitourinary:  Negative for difficulty urinating, dysuria, flank pain, frequency and urgency.  Musculoskeletal:  Negative for arthralgias, back pain, gait problem, joint swelling, myalgias, neck pain and neck stiffness.  Skin:  Negative for color change, pallor, rash and wound.  Neurological:  Negative for dizziness, syncope, speech difficulty, weakness, light-headedness, numbness and headaches.  Hematological:  Does not bruise/bleed easily.  Psychiatric/Behavioral:  Negative for agitation, behavioral problems, confusion,  hallucinations, self-injury, sleep disturbance and suicidal ideas. The patient is not nervous/anxious.     Immunization History  Administered Date(s) Administered   Fluad Quad(high Dose 65+) 12/15/2020, 12/26/2021, 12/21/2022   Influenza, High Dose Seasonal PF 02/13/2018, 01/06/2019   Influenza-Unspecified 12/31/2016   Moderna Covid-19 Fall Seasonal Vaccine 57yrs & older 12/21/2022   Moderna Sars-Covid-2 Vaccination 12/22/2021   PFIZER(Purple Top)SARS-COV-2 Vaccination 04/29/2019, 05/20/2019, 01/04/2020, 07/20/2020, 12/18/2020   Pneumococcal Conjugate-13 01/17/2017   Pneumococcal Polysaccharide-23 04/09/2019   Tdap 03/14/2020   Pertinent  Health Maintenance Due  Topic Date Due   MAMMOGRAM  02/16/2023   INFLUENZA VACCINE  11/01/2023   Colonoscopy  05/05/2024   DEXA SCAN  Completed      12/18/2021    9:21 AM 12/26/2021   10:08 AM 12/24/2022    2:12 PM 01/21/2023   10:28 AM 05/15/2023    8:52 AM  Fall Risk  Falls in the past year? 0 0 0 0 0  Was there an injury with Fall? 0 0  0 0  Fall Risk Category Calculator 0 0  0 0  Fall Risk Category (Retired) Low  Low      (RETIRED) Patient Fall Risk Level Low fall risk  Low fall risk      Patient at Risk for Falls Due to No Fall Risks No Fall Risks  No Fall Risks   Fall risk Follow up Falls evaluation completed  Falls evaluation completed   Falls evaluation completed      Data saved with a previous flowsheet row definition   Functional Status Survey:    Vitals:   09/12/23 1544 09/12/23 1549  BP: (!) 98/50 (!) 96/54  Pulse: 80   Temp: 98 F (36.7 C)   SpO2: 97%   Weight: 89 lb (40.4 kg)   Height: 5' 4 (1.626 m)    Body mass index is 15.28 kg/m. Physical Exam Physical Exam   MEASUREMENTS: Weight- 89. GENERAL: Alert, cooperative, well developed, no acute distress. HEENT: Normocephalic, oral thrush present on tongue and cheeks, moist mucous membranes. CHEST: Clear to auscultation bilaterally, no wheezes, rhonchi, or  crackles. CARDIOVASCULAR: Normal heart rate and rhythm, S1 and S2 normal without murmurs. ABDOMEN: Soft, non-tender, non-distended, without organomegaly, normal bowel sounds. EXTREMITIES: No cyanosis or edema. NEUROLOGICAL: Cranial nerves grossly intact, moves all extremities without gross motor or sensory deficit.  SKIN: No rash,no lesion or erythema   PSYCHIATRY/BEHAVIORAL: Mood stable    Labs reviewed: Recent Labs    09/01/23 0748 09/02/23 0522 09/04/23 1037 09/12/23 1615  NA 127* 128* 130* 134*  K 3.8 4.1 3.9 3.8  CL 95* 94* 97* 98  CO2 26 25  23 28  GLUCOSE 104* 105* 136* 103*  BUN 9 6* 9 9  CREATININE 0.50 0.53 0.57 0.60  CALCIUM  7.2* 7.4* 7.5* 7.8*  MG 2.0 1.8 1.9  --   PHOS 2.2* 1.5* 2.4* 3.5   Recent Labs    08/30/23 1720 09/01/23 0748 09/04/23 1037 09/12/23 1615  AST 33 18 37 25  ALT 23 17 28 28   ALKPHOS 121 140* 427*  --   BILITOT 0.9 1.2 0.9 0.2  PROT 5.9* 4.7* 4.6* 5.6*  ALBUMIN 2.2* 1.7* 1.7*  --    Recent Labs    09/01/23 0748 09/02/23 0522 09/04/23 1037  WBC 2.1* 1.5* 2.3*  NEUTROABS 1.4* 0.9* 1.1*  HGB 11.9* 11.7* 11.5*  HCT 36.3 34.7* 35.3*  MCV 89.4 89.4 89.8  PLT 166 145* 97*   Lab Results  Component Value Date   TSH 2.42 12/12/2020   No results found for: HGBA1C Lab Results  Component Value Date   CHOL 175 01/17/2023   HDL 91 01/17/2023   LDLCALC 68 01/17/2023   TRIG 82 01/17/2023   CHOLHDL 1.9 01/17/2023    Significant Diagnostic Results in last 30 days:  ECHOCARDIOGRAM COMPLETE Result Date: 09/04/2023    ECHOCARDIOGRAM REPORT   Patient Name:   Rebecca [name] Date of Exam: 09/04/2023 Medical Rec #:  [id number]   Height:       64.0 in Accession #:    [id number]  Weight:       101.0 lb Date of Birth:  1950/09/22   BSA:          1.463 m Patient Age:    73 years    BP:           131/70 mmHg Patient Gender: F           HR:           85 bpm. Exam Location:  Inpatient Procedure: 2D Echo, Cardiac Doppler and Color Doppler (Both Spectral and  Color            Flow Doppler were utilized during procedure). Indications:    R94.31 Abnormal EKG  History:        Patient has no prior history of Echocardiogram examinations.  Sonographer:    Andrena Bang Referring Phys: [id number] KUBER GHIMIRE IMPRESSIONS  1. Left ventricular ejection fraction, by estimation, is 60 to 65%. The left ventricle has normal function. The left ventricle has no regional wall motion abnormalities. Left ventricular diastolic parameters are consistent with Grade I diastolic dysfunction (impaired relaxation).  2. Right ventricular systolic function is normal. The right ventricular size is normal. There is normal pulmonary artery systolic pressure. The estimated right ventricular systolic pressure is 25.7 mmHg.  3. The mitral valve is normal in structure. Trivial mitral valve regurgitation. No evidence of mitral stenosis.  4. The aortic valve is tricuspid. There is mild calcification of the aortic valve. Aortic valve regurgitation is not visualized. Aortic valve sclerosis/calcification is present, without any evidence of aortic stenosis.  5. The inferior vena cava is normal in size with greater than 50% respiratory variability, suggesting right atrial pressure of 3 mmHg. FINDINGS  Left Ventricle: Left ventricular ejection fraction, by estimation, is 60 to 65%. The left ventricle has normal function. The left ventricle has no regional wall motion abnormalities. The left ventricular internal cavity size was normal in size. There is  no left ventricular hypertrophy. Left ventricular diastolic parameters are consistent with Grade I diastolic dysfunction (impaired relaxation). Right Ventricle: The right  ventricular size is normal. No increase in right ventricular wall thickness. Right ventricular systolic function is normal. There is normal pulmonary artery systolic pressure. The tricuspid regurgitant velocity is 2.38 m/s, and  with an assumed right atrial pressure of 3 mmHg, the estimated right  ventricular systolic pressure is 25.7 mmHg. Left Atrium: Left atrial size was normal in size. Right Atrium: Right atrial size was normal in size. Pericardium: There is no evidence of pericardial effusion. Mitral Valve: The mitral valve is normal in structure. Trivial mitral valve regurgitation. No evidence of mitral valve stenosis. Tricuspid Valve: The tricuspid valve is normal in structure. Tricuspid valve regurgitation is mild . No evidence of tricuspid stenosis. Aortic Valve: The aortic valve is tricuspid. There is mild calcification of the aortic valve. Aortic valve regurgitation is not visualized. Aortic valve sclerosis/calcification is present, without any evidence of aortic stenosis. Aortic valve mean gradient measures 2.0 mmHg. Aortic valve peak gradient measures 4.9 mmHg. Aortic valve area, by VTI measures 1.54 cm. Pulmonic Valve: The pulmonic valve was normal in structure. Pulmonic valve regurgitation is trivial. No evidence of pulmonic stenosis. Aorta: The aortic root is normal in size and structure. Venous: The inferior vena cava is normal in size with greater than 50% respiratory variability, suggesting right atrial pressure of 3 mmHg. IAS/Shunts: No atrial level shunt detected by color flow Doppler.  LEFT VENTRICLE PLAX 2D LVIDd:         3.00 cm     Diastology LVIDs:         2.20 cm     LV e' medial:    6.85 cm/s LV PW:         1.10 cm     LV E/e' medial:  12.4 LV IVS:        0.80 cm     LV e' lateral:   10.40 cm/s LVOT diam:     1.50 cm     LV E/e' lateral: 8.2 LV SV:         36 LV SV Index:   25 LVOT Area:     1.77 cm  LV Volumes (MOD) LV vol d, MOD A2C: 41.2 ml LV vol d, MOD A4C: 47.8 ml LV vol s, MOD A2C: 15.7 ml LV vol s, MOD A4C: 14.9 ml LV SV MOD A2C:     25.5 ml LV SV MOD A4C:     47.8 ml LV SV MOD BP:      28.9 ml RIGHT VENTRICLE RV S prime:     11.10 cm/s TAPSE (M-mode): 1.4 cm LEFT ATRIUM             Index LA diam:        2.50 cm 1.71 cm/m LA Vol (A2C):   14.8 ml 10.11 ml/m LA Vol (A4C):    19.4 ml 13.26 ml/m LA Biplane Vol: 17.4 ml 11.89 ml/m  AORTIC VALVE AV Area (Vmax):    1.64 cm AV Area (Vmean):   1.41 cm AV Area (VTI):     1.54 cm AV Vmax:           111.00 cm/s AV Vmean:          70.900 cm/s AV VTI:            0.234 m AV Peak Grad:      4.9 mmHg AV Mean Grad:      2.0 mmHg LVOT Vmax:         103.00 cm/s LVOT Vmean:  56.500 cm/s LVOT VTI:          0.204 m LVOT/AV VTI ratio: 0.87 MITRAL VALVE                TRICUSPID VALVE MV Area (PHT): 3.63 cm     TR Peak grad:   22.7 mmHg MV Decel Time: 209 msec     TR Vmax:        238.00 cm/s MV E velocity: 85.10 cm/s MV A velocity: 107.00 cm/s  SHUNTS MV E/A ratio:  0.80         Systemic VTI:  0.20 m                             Systemic Diam: 1.50 cm Jules Oar MD Electronically signed by Jules Oar MD Signature Date/Time: 09/04/2023/3:22:31 PM    Final    DG Abd Portable 1V Result Date: 09/01/2023 CLINICAL DATA:  Follow up small bowel obstruction EXAM: PORTABLE ABDOMEN - 1 VIEW COMPARISON:  08/31/2023 FINDINGS: Previously administered contrast now lies entirely within the colon. No persistent small bowel dilatation is seen. No free air is noted. Contrast is seen within the bladder. IMPRESSION: No evidence of small-bowel obstruction. Electronically Signed   By: Violeta Grey M.D.   On: 09/01/2023 10:17   DG Abd Portable 1V-Small Bowel Obstruction Protocol-initial, 8 hr delay Result Date: 08/31/2023 CLINICAL DATA:  SBO Protocol, 8 hr delay image EXAM: PORTABLE ABDOMEN - 1 VIEW COMPARISON:  X-ray abdomen 08/31/2023, CT abdomen pelvis 08/30/2023. FINDINGS: PO contrast opacifying the majority of the small bowel that is distended up to 2.9 cm. Likely PO contrast within the ascending colon. No radio-opaque calculi or other significant radiographic abnormality are seen. IMPRESSION: PO contrast opacifying the majority of the small bowel that is distended up to 2.9 cm. Likely PO contrast within the ascending colon. Recommend attention on  follow-up. Electronically Signed   By: Morgane  Naveau M.D.   On: 08/31/2023 19:31   DG Abd 1 View Result Date: 08/31/2023 CLINICAL DATA:  Abdominal pain and bloating for several weeks nausea and vomiting. Anal carcinoma. EXAM: ABDOMEN - 1 VIEW COMPARISON:  None FINDINGS: Mildly dilated small bowel loops are seen in the mid abdomen. Colonic gas is noted, but is not dilated. These findings may be seen with partial small bowel obstruction or ileus. Contrast is seen in the urinary bladder from recent CT. IMPRESSION: Partial small bowel obstruction versus ileus. Electronically Signed   By: Marlyce Sine M.D.   On: 08/31/2023 09:34   CT ABDOMEN PELVIS W CONTRAST Result Date: 08/30/2023 CLINICAL DATA:  Generalized abdominal pain and fever.  Anal cancer. EXAM: CT ABDOMEN AND PELVIS WITH CONTRAST TECHNIQUE: Multidetector CT imaging of the abdomen and pelvis was performed using the standard protocol following bolus administration of intravenous contrast. RADIATION DOSE REDUCTION: This exam was performed according to the departmental dose-optimization program which includes automated exposure control, adjustment of the mA and/or kV according to patient size and/or use of iterative reconstruction technique. CONTRAST:  OMNIPAQUE  IOHEXOL  300 MG/ML  SOLN COMPARISON:  Head CT 05/21/2023. FINDINGS: Lower chest: No acute abnormality. Hepatobiliary: No focal liver abnormality is seen. No gallstones, gallbladder wall thickening, or biliary dilatation. Pancreas: Unremarkable. No pancreatic ductal dilatation or surrounding inflammatory changes. Spleen: Normal in size without focal abnormality. Adrenals/Urinary Tract: Adrenal glands are unremarkable. Kidneys are normal, without renal calculi, focal lesion, or hydronephrosis. Bladder is unremarkable. Stomach/Bowel: There is a moderate length segment  of circumferential wall thickening and inflammation of the distal ileum/terminal ileum. This is transition point for small bowel  obstruction were dilated bowel loops measure up to 3.3 cm proximal to this level. The stomach is distended and fluid-filled. There is a small hiatal hernia. There is also fluid within the distal esophagus. The appendix is not seen. The colon is nondilated there is some edema and mild inflammation in the region theanus. Vascular/Lymphatic: Aortic atherosclerosis. No enlarged abdominal or pelvic lymph nodes. Reproductive: Uterus and bilateral adnexa are unremarkable. Other: No abdominal wall hernia or abnormality. No abdominopelvic ascites. Musculoskeletal: No fracture is seen. IMPRESSION: 1. Moderate length segment of circumferential wall thickening and inflammation of the distal ileum/terminal ileum. This is transition point for small bowel obstruction. Findings are concerning for infectious or inflammatory enteritis. 2. Distended stomach with fluid in the distal esophagus. 3. Small hiatal hernia. 4. Mild edema and inflammation in the region of the anus. 5. Aortic atherosclerosis. Aortic Atherosclerosis (ICD10-I70.0). Electronically Signed   By: Tyron Gallon M.D.   On: 08/30/2023 21:05   DG Chest Port 1 View Result Date: 08/30/2023 CLINICAL DATA:  Concern for sepsis. EXAM: PORTABLE CHEST 1 VIEW COMPARISON:  03/14/2020. FINDINGS: The heart size and mediastinal contours are within normal limits. Hyperinflation. No focal consolidation, pleural effusion, or pneumothorax. No acute osseous abnormality. IMPRESSION: No acute cardiopulmonary findings. Electronically Signed   By: Mannie Seek M.D.   On: 08/30/2023 19:08    Assessment/Plan Anal Cancer Post-surgery and six weeks of radiation therapy with concurrent chemotherapy during the first and last weeks. Currently in recovery, experiencing significant weight loss and decreased appetite, likely due to treatment side effects. Maintaining weight is crucial to prevent further weakness. Risks of continued weight loss include increased weakness and potential  hospitalization. Anticipated outcome of appetite stimulant is improved appetite and weight stabilization. - Prescribe Remeron  (mirtazapine ) 7.5 mg at bedtime to stimulate appetite and aid weight gain. - Refer to Dr. Josphine Nims, radiation oncologist, for follow-up care. - Encourage small, frequent meals and increased protein intake with foods like eggs, peanut butter, chicken, and cheese. - Monitor weight closely and consider emergency room visit if unable to eat.  Protein calorie malnutrition  Low protein levels with total protein at 4.6 and albumin at 1.7. Phosphorus levels have improved with supplementation, and sodium levels have improved but remain low. Lack of egg consumption may contribute to low protein levels. Monitoring and dietary adjustments are necessary. - Recheck lab work to monitor phosphorus, sodium, and protein levels. - Encourage increased protein intake through diet, including eggs, peanut butter, chicken, and cheese.  Oral Thrush Diagnosed with oral thrush during a recent emergency room visit, likely due to cancer treatment. Despite nystatin  use, thrush persists on tongue and cheeks. Continued treatment with nystatin  is necessary. - Refill nystatin  for oral thrush and instruct to swish and swallow, avoiding eating for 30 minutes post-use.  Follow-up Ongoing monitoring of weight, nutritional status, and response to appetite stimulant required. Follow-up with new oncologist necessary for continued cancer care. - Schedule follow-up appointment in one month to recheck weight and assess appetite stimulant effectiveness. - Ensure she receives a call from Dr. Josphine Nims' office for follow-up care.   Family/ staff Communication: Reviewed plan of care with patient and Spouse verbalized understanding   Labs/tests ordered:  - BMP - Hepatic Panel   - Phosphorus   Next Appointment : Return in about 1 month (around 10/12/2023) for weight check.   Spent 30 minutes of Face to  face and non-face to face with patient  >50% time spent counseling; reviewing medical record; tests; labs; documentation and developing future plan of care.   Estil Heman, NP

## 2023-09-23 ENCOUNTER — Encounter: Admitting: General Practice

## 2023-09-23 ENCOUNTER — Inpatient Hospital Stay: Admitting: Nutrition

## 2023-09-23 NOTE — Progress Notes (Signed)
 Nutrition follow up completed with patient and husband. Patient diagnosed with anal cancer s/p completion of cycle two 5-FU/Mitomycin -C beginning 08/19/2023. She completed the course of radiation 08/29/2023.   She was hospitalized 08/30/2023 to 09/05/2023. Findings concerning for infectious or inflammatory enteritis.   Weight: 87.6 pounds June 23 98 pounds 1.6 oz May 26 103 pounds 12.8 oz April 21  No new labs.  Reports appetite increased and is eating more. Tries to eat 3 meals and 2 snacks. She feels stronger and has been walking farther and more often. No longer has to use a walker or a cane. She continues to have taste aversions and finds some foods are not appetizing. Her husband does most of the cooking and wants to understand how to prepare better options for her. Tries to offer lean proteins and vegetables because he knows these are healthy, however, patient doesn't have a taste for vegetables currently. Questions about oat milk verses regular dairy milk. Request tips on bowel management. She had constipation over the weekend but finally had a bowel movement. She does not want to take laxatives.  Nutrition Diagnosis: Food and Nutrition Related Knowledge Deficit continues.  Intervention: Reviewed high calorie high protein foods patient prefers and encouraged more intake. Choose Ensure Plus or Ensure Complete as ONS, at least one daily. Reviewed difference between therapeutic diets for constipation and diarrhea. Educated on soluble and insoluble fiber and importance of increased fluid. Nutrition fact sheet provided. Questions answered to patient and husband's satisfaction. Provided contact information again and encouraged them to call me with questions or if they would like a follow up appointment.  Monitoring, Evaluation, Goals: Increase calories and protein to promote healing and increase in lean muscle/strength.  Next Visit: To be scheduled as needed.

## 2023-09-24 DIAGNOSIS — C218 Malignant neoplasm of overlapping sites of rectum, anus and anal canal: Secondary | ICD-10-CM | POA: Diagnosis not present

## 2023-09-24 NOTE — Progress Notes (Signed)
   PROVIDER:  BERNARDA WANDA NED, MD  MRN: [id number] DOB: Oct 27, 1950 DATE OF ENCOUNTER: 09/24/2023 Subjective   Chief Complaint: Follow-up (anal scc, TRANSANAL EXC)     History of Present Illness: Rebecca [name] is a 73 y.o. 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 She underwent surgical resection of anterior distal rectal mass but deep margin was positive and so she underwent ChemoRT.  This was completed on 08/28/23.       Objective:    Vitals:   09/24/23 1124  BP: 101/66  Pulse: 102  Temp: 36.7 C (98.1 F)  SpO2: 91%  Weight: (!) 39.9 kg (88 lb)  Height: 162.6 cm (5' 4)  PainSc: 0-No pain  PainLoc: Rectum      Gen: NAD Ing: no LAD present Rectal: R anterior scar present  Labs, Imaging and Diagnostic Testing:   Procedure: Anoscopy Surgeon: NED After the risks and benefits were explained, written consent was obtained for above procedure.  A medical assistant chaperone was present thoroughout the entire procedure.  Anesthesia: none Diagnosis: Anal SCC Findings: R anterior distal rectal scar, no signs of recurrence  Assessment and Plan:  Diagnoses and all orders for this visit:  Anal squamous cell carcinoma (CMS/HHS-HCC)   73 y.o. F with distal rectal SCC.  S/p transanal resection and subsequent Chemo RT There is no sign of residual or recurrent disease.   Return in about 3 months (around 12/25/2023), or if symptoms worsen or fail to improve.    BERNARDA JAYSON NED, MD Colon and Rectal Surgery [facility]

## 2023-09-25 ENCOUNTER — Ambulatory Visit: Attending: Radiation Oncology

## 2023-09-25 DIAGNOSIS — R293 Abnormal posture: Secondary | ICD-10-CM | POA: Insufficient documentation

## 2023-09-25 DIAGNOSIS — R279 Unspecified lack of coordination: Secondary | ICD-10-CM | POA: Diagnosis not present

## 2023-09-25 DIAGNOSIS — M6281 Muscle weakness (generalized): Secondary | ICD-10-CM | POA: Diagnosis not present

## 2023-09-25 NOTE — Therapy (Unsigned)
 OUTPATIENT PHYSICAL THERAPY FEMALE PELVIC EVALUATION   Patient Name: Rebecca [name] MRN: [id number] DOB:1950-10-22, 73 y.o., female Today's Date: 09/25/2023  END OF SESSION:  PT End of Session - 09/25/23 1406     Visit Number 2    Date for PT Re-Evaluation 12/18/23    Authorization Type [employer] Medicare    Progress Note Due on Visit 10    PT Start Time 1405    PT Stop Time 1443    PT Time Calculation (min) 38 min    Activity Tolerance Patient tolerated treatment well    Behavior During Therapy WFL for tasks assessed/performed           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)    Past Surgical History:  Procedure Laterality Date   COLONOSCOPY  07/2017   JMP-MAC-goly(good)-tics/SSP x 1   OPEN REDUCTION INTERNAL FIXATION (ORIF) DISTAL RADIAL FRACTURE Left 01/04/2021   Procedure: OPEN REDUCTION INTERNAL FIXATION (ORIF) DISTAL RADIAL FRACTURE;  Surgeon: Kendal Franky SQUIBB, MD;  Location: [facility] OR;  Service: Orthopedics;  Laterality: Left;   ORIF TIBIA PLATEAU Right 03/16/2020   Procedure: OPEN REDUCTION INTERNAL FIXATION (ORIF) TIBIAL PLATEAU;  Surgeon: Kendal Franky SQUIBB, MD;  Location: [facility] OR;  Service: Orthopedics;  Laterality: Right;   POLYPECTOMY  2019   SSP x 1   TRANSANAL EXCISION OF RECTAL MASS N/A 06/05/2023   Procedure: TRANSANAL EXCISION ANAL LESION;  Surgeon: Debby Hila, MD;  Location: [facility] ORS;  Service: General;  Laterality: N/A;   WISDOM TOOTH EXTRACTION     Patient Active Problem List   Diagnosis Date Noted   Protein-calorie malnutrition, severe 09/04/2023   Enteritis 08/30/2023   Anal cancer (HCC) 05/16/2023   Fracture, [place], left, closed 01/03/2021   Body mass index (BMI) 19.9 or less, adult 04/07/2020   Age-related osteoporosis without current pathological fracture 07/10/2017   Seasonal allergies  07/10/2017   Hyperlipidemia 07/10/2017   Mild intermittent asthma 07/10/2017    PCP: Ngetich, Roxan BROCKS, NP  REFERRING PROVIDER: Lanell Donald Stagger, PA-C   REFERRING DIAG: C21.0 (ICD-10-CM) - Anal cancer (HCC)  THERAPY DIAG:  Muscle weakness (generalized)  Unspecified lack of coordination  Abnormal posture  Rationale for Evaluation and Treatment: Rehabilitation  ONSET DATE: 05/06/2023  SUBJECTIVE:  SUBJECTIVE STATEMENT: Pt states that surgery did not clear margins for tumor and she had chemoradiation. She did not do well with this and ended up in the hospital. She states that her last radiation treatment was 08/28/23. She feels like she has a lot of skin irritation in perineum. She has been using creams 2x/Genao. She has very little discomfort.   PAIN: 09/25/23 Are you having pain? No   PRECAUTIONS: Other: anal cancer  RED FLAGS: None   WEIGHT BEARING RESTRICTIONS: No  FALLS:  Has patient fallen in last 6 months? No  OCCUPATION: retired  ACTIVITY LEVEL : walking, chair yoga  PLOF: Independent  PATIENT GOALS: 09/25/23; she would like to return to exercises and get help started dilator program  PERTINENT HISTORY:  Anal cancer/squamous cell carcinoma, asthma, osteoporosis 09/25/23: BOWEL MOVEMENT: Pain with bowel movement: Yes when she is more constipated  Type of bowel movement:Type (Bristol Stool Scale) 1-7, Frequency 1x/Plascencia, and Strain when constipated ; is using squatty potty Fully empty rectum: Yes:   Leakage: Yes: just several times, does not feel like this is a big problem  Pads: No Fiber supplement/laxative colace 1x/Urbas before bed (started after surgery) 09/25/23: URINATION: Pain with urination: No Fully empty bladder: Yes:   Stream: Strong Urgency: No Frequency: every  couple hours  Leakage: Coughing - very occasional Pads: No  09/25/23 INTERCOURSE: Has not returned to intercourse at this time Has not started working with dilators yet  Would like to return to intimacy  PREGNANCY: Vaginal deliveries 1 Tearing Yes: unknown Episiotomy No C-section deliveries 0 Currently pregnant No  PROLAPSE: None   OBJECTIVE:  Note: Objective measures were completed at Evaluation unless otherwise noted. 09/25/23 PALPATION:                External Perineal Exam: evidence of healing skin around perineum and vulva; redness and peeling                             Internal Pelvic Floor: no tenderness with finger being present in vaginal canal; movement further in and removing finger did cause increase in pain; light palpation around anus non-tender, but small pressure into external anal sphincter caused increase in pain - did not push further and assessed strength here   Patient confirms identification and approves PT to assess internal pelvic floor and treatment Yes  PELVIC MMT:   MMT eval  Vaginal 2/5, 4 second hold, 5 repeat contractions with good coordination  Internal Anal Sphincter 2/5  External Anal Sphincter 2/5  Puborectalis 2/5  (Blank rows = not tested)        TONE: low      07/02/23:  PATIENT SURVEYS:   PFIQ-7: 0  COGNITION: Overall cognitive status: Within functional limits for tasks assessed     SENSATION: Light touch: Appears intact    FUNCTIONAL TESTS:  Squat: WNL Single leg stance:  Rt: stable  Lt: stable   GAIT: Assistive device utilized: None Comments: WNL  POSTURE: rounded shoulders, forward head, increased thoracic kyphosis, and posterior pelvic tilt   LUMBARAROM/PROM:  A/PROM A/PROM  Eval (% available)  Flexion 100  Extension 50  Right lateral flexion 100  Left lateral flexion 100  Right rotation 100  Left rotation 100   (Blank rows = not tested)  PALPATION:                External Perineal Exam:  vulvar atrophy, phimosis, labial fusion  Internal Pelvic Floor: some vaginal stenosis starting in lateral vaginal canal bil  Patient confirms identification and approves PT to assess internal pelvic floor and treatment Yes  PELVIC MMT:   MMT eval  Vaginal 3/5, 5 second hold, 5 repeat contractions with good coordination  Internal Anal Sphincter 2/5  External Anal Sphincter 2/5  Puborectalis 3/5  (Blank rows = not tested)        TONE: low  PROLAPSE: Unable to tell due to inability to coordinate bearing down  TODAY'S TREATMENT:                                                                                                                              DATE:  09/25/23 RE-EVAL Manual: Pt provides verbal consent for internal vaginal/rectal pelvic floor exam. Internal vaginal pelvic floor muscle re-assessment External anal/perineum assessment Therapeutic activities: Coconut oil for regular moisturizing of perineum Contacted MD about starting use of estrogen cream again - she said this is fine HEP review   07/02/23  EVAL  Neuromuscular re-education: Pelvic floor muscle contraction training vaginally and rectally Pt provides verbal consent for internal vaginal/rectal pelvic floor exam. Therapeutic activities: HEP for mobility and gentle strengthening Walking program Vaginal moisturizer handout (nothing during radiation) Vaginal lubricant handout (nothing during radiation) Education on dilators - do not start until 4-6 weeks after last radiation session Squatty potty and relaxed toilet mechanics Peri bottle for gentle cleansing and water wipes  Bowel massage    PATIENT EDUCATION:  Education details: See above Person educated: Patient Education method: Explanation, Demonstration, Tactile cues, Verbal cues, and Handouts Education comprehension: verbalized understanding  HOME EXERCISE PROGRAM: [id number]  ASSESSMENT:  CLINICAL  IMPRESSION: Patient is a 73 y.o. female who was seen today for re-evaluation after finishing chemoradiation 4 weeks ago. Overall she is doing very well with recovery, not having any issues with bowel or bladder. She demonstrates some loss of strength in pelvic floor, but very little for the treatment she has gone through. Skin in perineum is recovering well. We discussed starting ues of coconut oil daily in the mornings to help further with comfort and moisture. She has not tried returning to intimate activities with partner or working with dilators, but she does have tenderness with internal vaginal exam and suspect that intimacy will be uncomfortable. We went over dilator use and how to start using them; she was given written instructions. She will continue to benefit from skilled PT intervention in order to help return to pain free intimacy and return to regular exercise without difficulty.   OBJECTIVE IMPAIRMENTS: decreased activity tolerance, decreased coordination, decreased endurance, decreased mobility, increased fascial restrictions, increased muscle spasms, impaired tone, postural dysfunction, and pain.   ACTIVITY LIMITATIONS: continence  PARTICIPATION LIMITATIONS: interpersonal relationship and community activity  PERSONAL FACTORS: 3+ comorbidities: medical history are also affecting patient's functional outcome.   REHAB POTENTIAL: Good  CLINICAL DECISION MAKING: Evolving/moderate complexity  EVALUATION COMPLEXITY: Moderate   GOALS: Goals reviewed  with patient? Yes  SHORT TERM GOALS: Updated 09/25/23   Pt will be independent with HEP.   Baseline: Goal status: MET 09/25/23  2.  Pt will be independent with use of squatty potty, relaxed toileting mechanics, and improved bowel movement techniques in order to increase ease of bowel movements and complete evacuation.   Baseline:  Goal status: MET 09/25/23  3.  Pt will be independent with gentle perineal cleansing techniques to  protect tissue and improve comfort.  Baseline:  Goal status: MET 09/25/23  4.  Pt will be confident with starting vaginal dilators and getting baseline of comfort with vaginal penetration.  Baseline:  Goal status: IN PROGRESS 09/25/23  5.  Pt will perform 150 minutes of moderate intensity walking each week.  Baseline:  Goal status: IN PROGRESS 09/25/23  6.  Pt will perform stretching and strengthening program 2-3x/week. Baseline:  Goal status: IN PROGRESS 09/25/23  LONG TERM GOALS: Updated 09/25/23  Pt will be independent with advanced HEP.   Baseline:  Goal status: IN PROGRESS 09/25/23  2.  Pt will report no fecal incontinence.  Baseline:  Goal status: MET 09/25/23  3.  Pt will be independent with dilator progressions.  Baseline:  Goal status: IN PROGRESS 09/25/23  4.  Pt will be comfortable with regular use of vaginal moisturizers and lubricants to improve vaginal and vulvar comfort.  Baseline:  Goal status: IN PROGRESS 09/25/23   PLAN:  PT FREQUENCY: 1-2x/week  PT DURATION: 12 weeks  PLANNED INTERVENTIONS: 97110-Therapeutic exercises, 97530- Therapeutic activity, 97112- Neuromuscular re-education, 97535- Self Care, [id number]- Manual therapy, Dry Needling, and Biofeedback  PLAN FOR NEXT SESSION: Progress strengthening and mobility program; review dilators.     Josette Mares, PT, DPT06/25/252:06 PM

## 2023-09-25 NOTE — Patient Instructions (Addendum)
 Vaginal trainers  Prior to Use:   Wash the vaginal trainer with soap and water before and after each use.   Use a water-soluble lubricant like Slippery Stuff or Surgulibe.   Avoid using Vaseline, coconut oil, or other oil-based lubricants. They are not water-soluble and can be irritating to the tissues in the vagina.   Do not use silicon-based lubricants with a silicon vaginal trainer. Using a siliconbased lubricant with a silicon device can contribute to break down of the material.  Setting Up Your Space   Work in a comfortable room lying on your back on a bed or couch with your knees bent and knees relaxed open. Use pillows underneath your knees as they are relaxed open and to support your upper back and head. Place a towel underneath your bottom to collect any lubricant.   Place your vaginal trainers and lubricant on a towel next to you within arm's reach for easy access.   Starting to use your trainer:  o Take 10-20 deep breaths to quiet your nervous system  o Perform stretches to help relax your hips and pelvic floor such as child's pose, cat/cow, or happy baby pose  Using Your Trainers   Coat the smallest vaginal trainer, or the size you are most comfortable using, with lubricant   Place the tip of the trainer at the opening to the vagina.   Take a few deep breaths to adjust to the sensation of the lubricant and trainer.   Slowly insert the rounded end of the trainer into your vaginal opening as far as you are comfortable. Pause and breathe if you experience pain, tension, or muscle guarding at any time. Once you feel comfortable gently slide trainer deeper into the vaginal canal as far as it will go without causing pain or discomfort.  Progressing with your Trainers   Gently press the trainer toward the bottom and sides of the vaginal opening to give it a gentle stretch. Pause and breathe at each spot and tension melts away.   Once fully inserted, turn the trainer clockwise and  counterclockwise to produce different sensations   Slowly move the trainer in and out as you breathe and focus on staying as relaxed as possible  To progress to the next size gradually, once one trainer is completely pain-free and comfortable to use, insert that smaller trainer first for 5-10 minutes and then follow with the next largest size trainer for 5-10 minutes. Gradually decrease the length of time using the smaller trainer as you increase the length of time using the larger trainer.   Move at your pace ad what's comfortable for you.  Wrapping up your session   Use trainers for 5-10 minutes every other day or 3 times a week   Wash and dry your vaginal trainers after use  Other considerations: Try to approach using vaginal trainers from a place of curiosity instead of judgment - what can my body do today, vs. why can't it do this, or I should be able to do this. Try letting go of that idea that it should be different, and try to meet yourself where you are at, without the pressure to change anything Do you bring your vaginal trainers into PT? Sometimes, bringing your trainers into sessions with your PT and having them talk you through the process while you are in control of the trainer can be helpful. Maybe they can help you find ways to make insertion a bit easier for you, or they can  help remind you to breathe. If you aren't doing this, I definitely recommend talking to a PT about it. Sometimes knowing the physical tools you have can help with the mental game. One thing that can be helpful to do before jumping to dilating is called "cupping". It is just taking your hand and holding your palm to your Rebecca Rebecca [name] and breathing. Doing this before doing any type of trainer training can be helpful as it lets you take a second and check in, vs jumping right in. Kind of like a warm up to your workout! Try different tools and see if you like another one more. Some of our patients prefer crystal wands or  plastic trainers to silicone, some prefer starting with a vibrating pelvic wand instead of a trainer, look at different options and see what interests you. You can also try different lubricants. And don't feel as though you need to jump right into inserting anything. The first few times (or minutes of the session) may just be about putting it at the entrance without inserting, and that's ok! We have also had patients find success with an external vibrator on their pubic bone while they use trainers as this can help distract nerves and increase muscle relaxation. This can be helpful to normalize the trainers. Leave the one you are currently using and the one you want to progress to somewhere you see it every day, like the bathroom counter or the bedside table. Seeing them every day can make them less intimidating. The more you do something, the more routine it becomes, so setting a vaginal trainers schedule and sticking to it can help make it less intimidating. Last thing that could be helpful to you is to Rebecca [name] yourself up a relaxing environment when you use your vaginal trainers. Play your favorite calming music, light your favorite candle, incense, or turn on your diffuser, wear your coziest t-shirt and socks, prop your legs on pillows, anything that feels like a big exhale. Don't distract yourself with tv or a movie. Stay tuned into your body to help maintain relaxation Listening to relaxing music or meditations can also be helpful. Preferences for guided meditations can be so different from person to person so find one you feel relaxed/safe with!   Pelvic Floor Vaginal dilators                                                             Rebecca Rebecca [name] Rebecca [name] Vaginal Dilator Kit                                                        Rebecca Rebecca [name] Rebecca [name]                                                                              Rebecca [name]  Rebecca Rebecca [name] Rebecca [name]                                                                                    Rebecca Rebecca [name] Rebecca [name]                                                                                        Rebecca Rebecca [name] Rebecca [name]                                                                                 V Well  Buyer, retail that you pump                                                                               Conservation officer, historic buildings                                                                  Vaginismus Vaginal dilators  Oh Nut for deep vaginal penetration limitation  Most of these dilators you can get on [employer]. The ones you are not able to do then look at the company website or https://www.hunt.info/

## 2023-09-26 IMAGING — RF DG WRIST COMPLETE 3+V*L*
1 series · 4 of 4 positions shown · non-contrast
Comparison: None

FLUOROSCOPY TIME:  0 minutes 52 seconds

Dose: 0.98 mGy

Images: For

CLINICAL DATA: ORIF LEFT wrist

EXAM:
LEFT WRIST - COMPLETE 3+ VIEW

[Series 1: run · 4 of 4 slices shown]
[im 1/4]
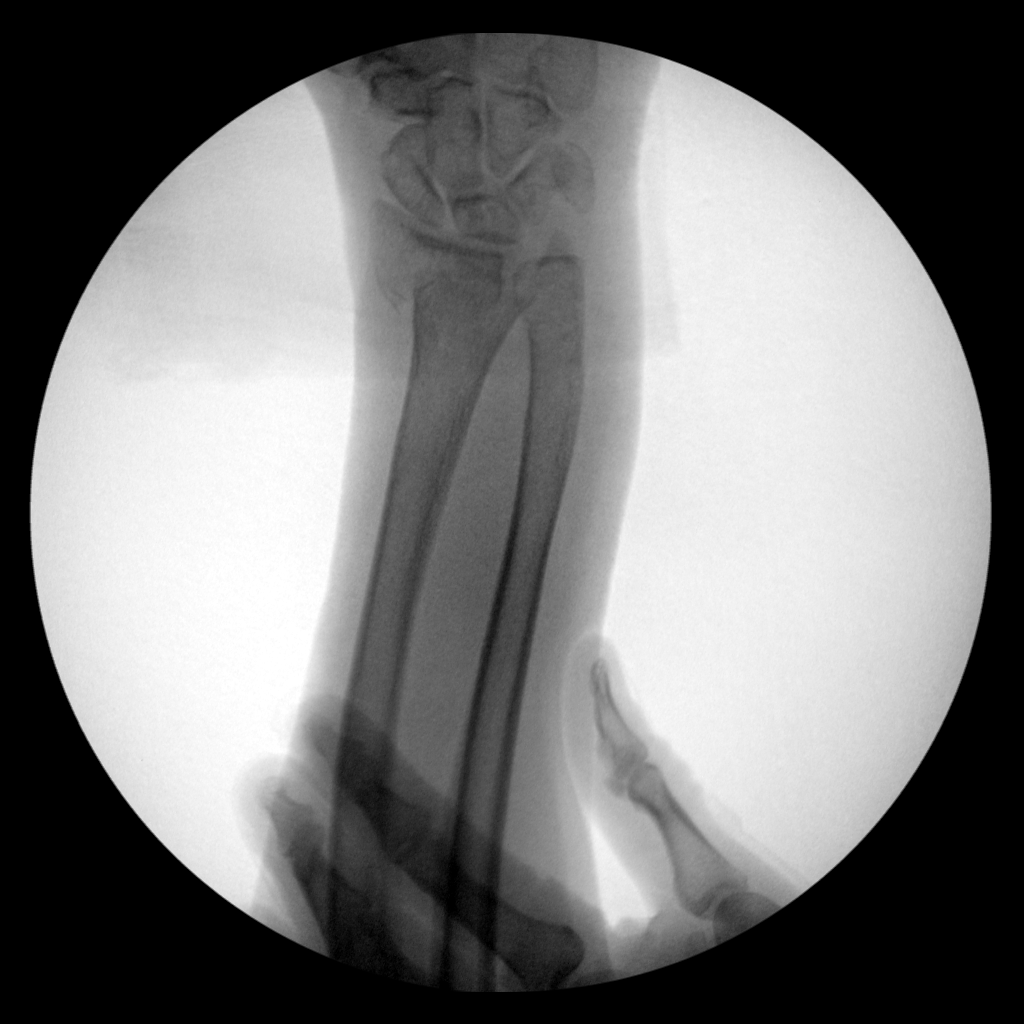
[im 2/4]
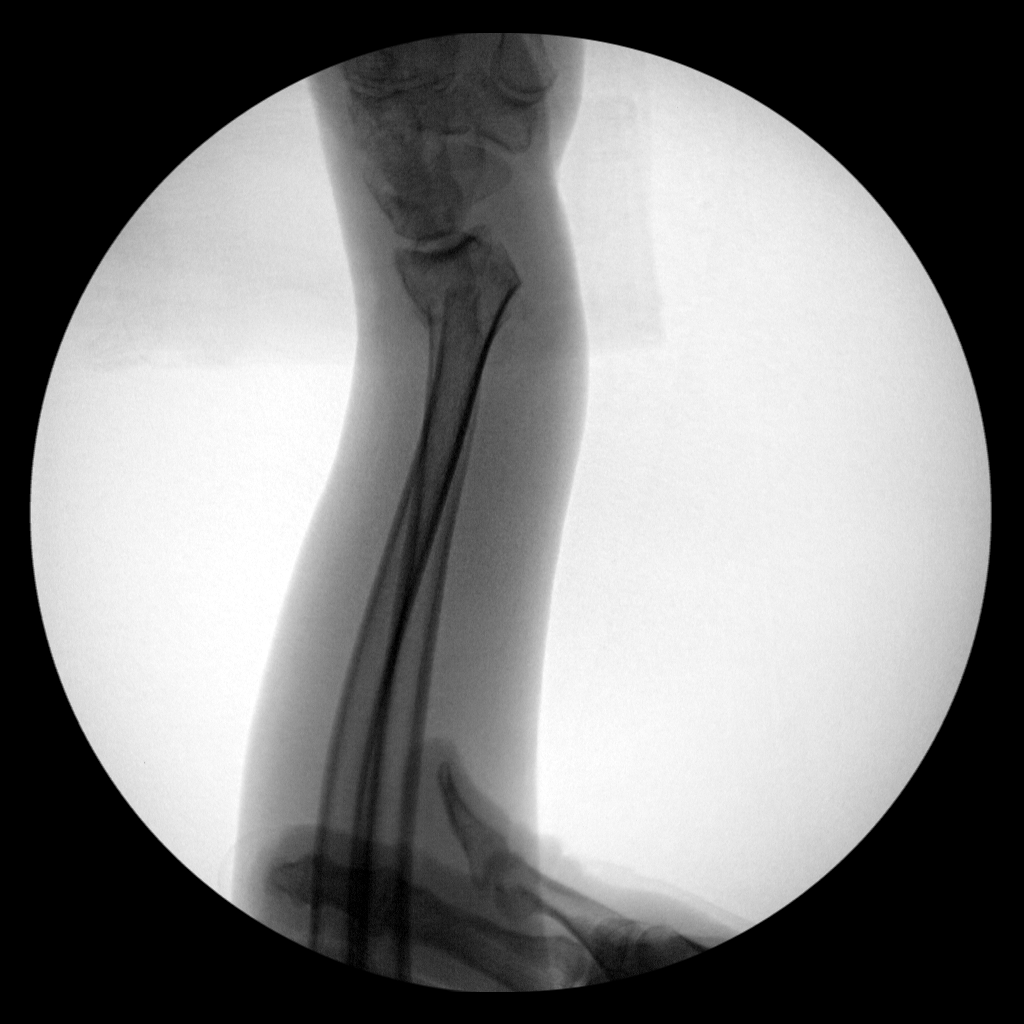
[im 3/4]
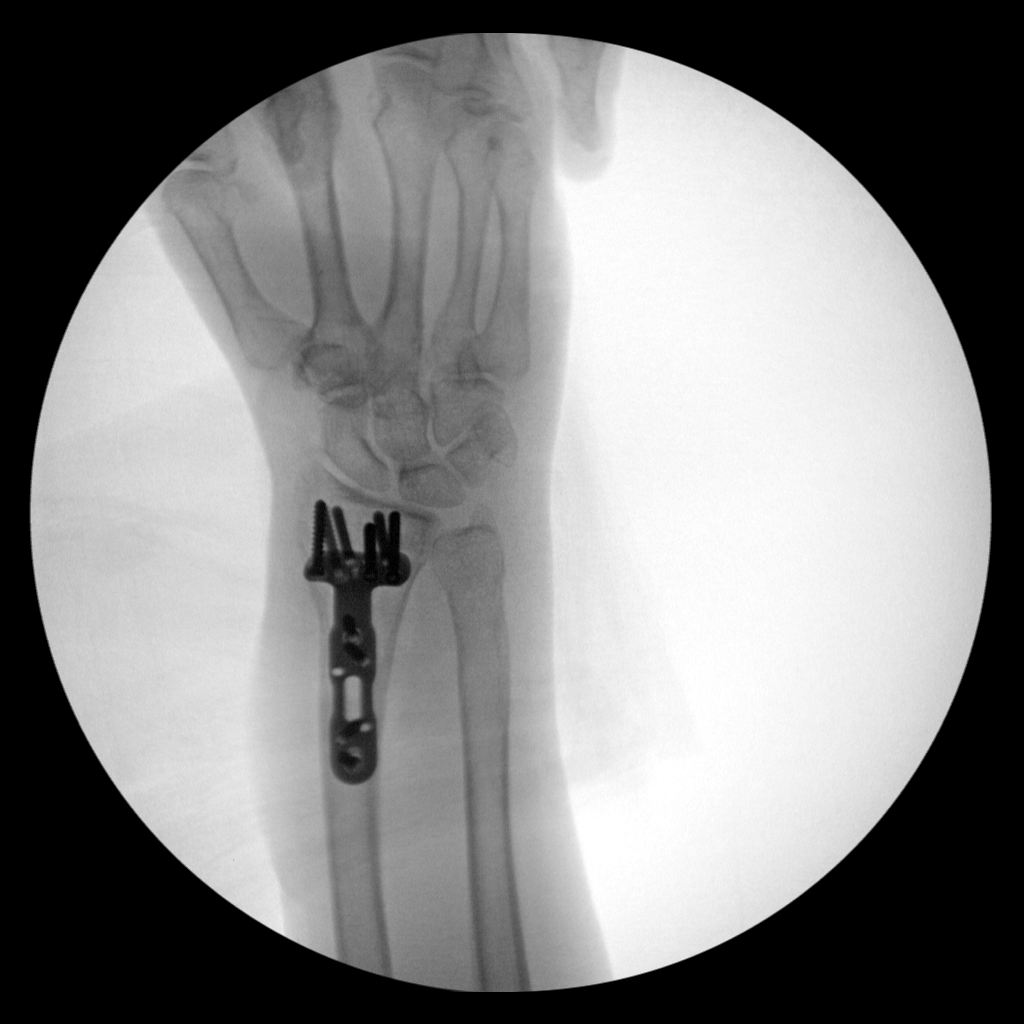
[im 4/4]
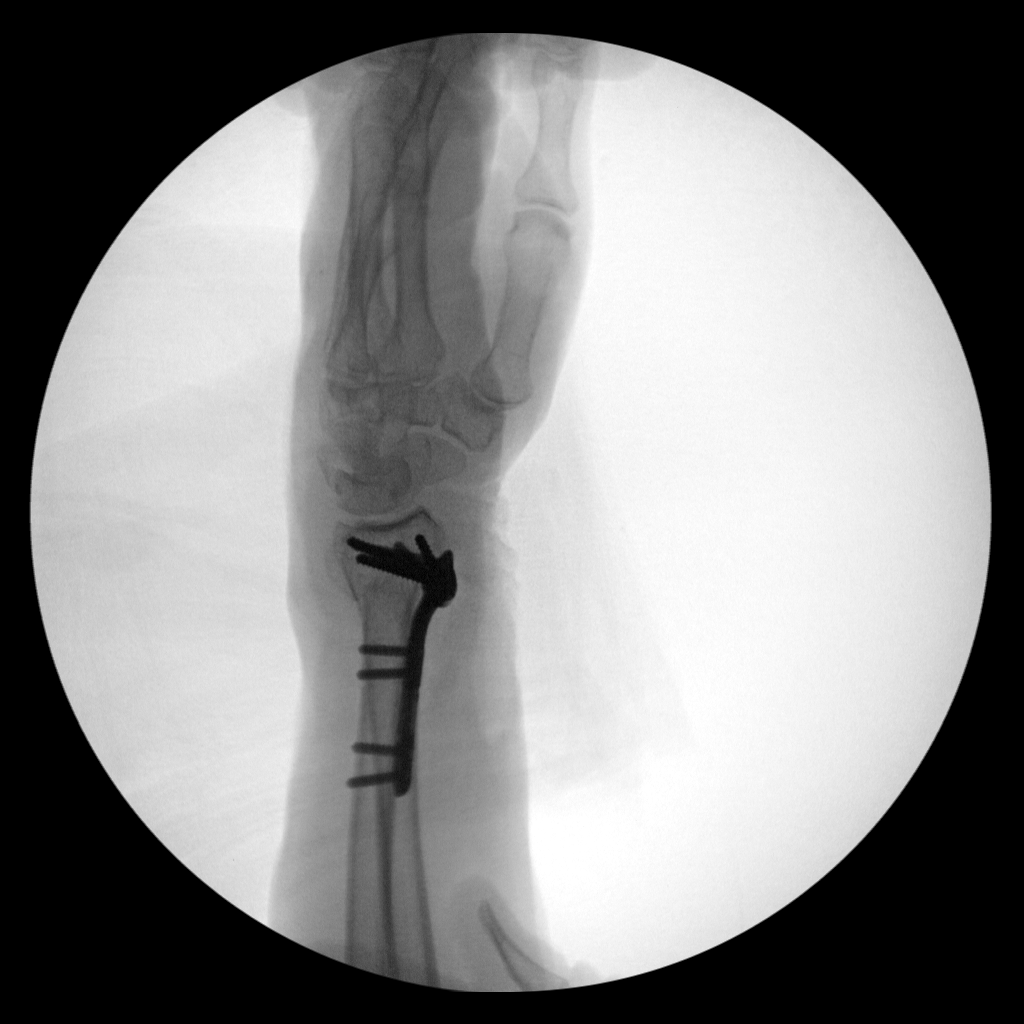

[4 of 4 positions shown; findings below may reference images not displayed]

FINDINGS: Images demonstrate significantly improved alignment of distal radial
metaphyseal fracture and placement of a volar plate and screws.

Slight dorsal tilt of distal radial articular surface.

Questionable nondisplaced distal ulnar metaphyseal fracture.
IMPRESSION: Post ORIF distal LEFT radial metaphyseal fracture with significantly
improved alignment, though minimal dorsal tilt of distal radial
articular surface remains.

Questionable nondisplaced distal LEFT ulnar metaphyseal fracture.

## 2023-09-26 NOTE — Progress Notes (Signed)
  Radiation Oncology         [phone] ________________________________  Name: Rebecca [name] MRN: [id number]  Date of Service: 09/30/2023  DOB: 1951-03-23  Post Treatment Telephone Note  Diagnosis:  Squamous Cell Carcinoma of the Anus   The patient was available for call today.   Symptoms of fatigue have improved since completing therapy.  Symptoms of skin changes have improved since completing therapy.   Symptoms of bladder changes have improved since completing therapy.  Symptoms of bowel changes have improved since completing therapy.  The patient has scheduled follow up with her medical oncologist Dr. Lanny on 09/30/2023 and their surgeon Dr. Debby for ongoing surveillance. she was encouraged to call if she develops concerns or questions regarding radiation.   This concludes the interaction.  Rosaline Minerva, LPN

## 2023-09-27 ENCOUNTER — Encounter: Admitting: General Practice

## 2023-09-29 NOTE — Assessment & Plan Note (Signed)
 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 -She underwent concurrent chemoradiation with 5-FU and mitomycin  from July 22, 2023 to Aug 29, 2023

## 2023-09-30 ENCOUNTER — Ambulatory Visit
Admission: RE | Admit: 2023-09-30 | Discharge: 2023-09-30 | Disposition: A | Discharge status: Home / Self Care | Source: Ambulatory Visit | Attending: Hematology | Admitting: Hematology

## 2023-09-30 ENCOUNTER — Ambulatory Visit: Admitting: Hematology

## 2023-09-30 ENCOUNTER — Inpatient Hospital Stay

## 2023-09-30 ENCOUNTER — Encounter: Payer: Self-pay | Admitting: Hematology

## 2023-09-30 VITALS — BP 129/65 | HR 108 | Temp 97.9°F | Resp 18 | Ht 64.0 in | Wt 87.7 lb

## 2023-09-30 DIAGNOSIS — C210 Malignant neoplasm of anus, unspecified: Secondary | ICD-10-CM | POA: Diagnosis not present

## 2023-09-30 DIAGNOSIS — J45909 Unspecified asthma, uncomplicated: Secondary | ICD-10-CM | POA: Diagnosis not present

## 2023-09-30 DIAGNOSIS — M810 Age-related osteoporosis without current pathological fracture: Secondary | ICD-10-CM | POA: Diagnosis not present

## 2023-09-30 DIAGNOSIS — C218 Malignant neoplasm of overlapping sites of rectum, anus and anal canal: Secondary | ICD-10-CM | POA: Diagnosis not present

## 2023-09-30 LAB — CBC WITH DIFFERENTIAL/PLATELET
Abs Immature Granulocytes: 0.03 10*3/uL (ref 0.00–0.07)
Basophils Absolute: 0.1 10*3/uL (ref 0.0–0.1)
Basophils Relative: 1 %
Eosinophils Absolute: 0.2 10*3/uL (ref 0.0–0.5)
Eosinophils Relative: 3 %
HCT: 41.4 % (ref 36.0–46.0)
Hemoglobin: 13.6 g/dL (ref 12.0–15.0)
Immature Granulocytes: 0 %
Lymphocytes Relative: 48 %
Lymphs Abs: 3.4 10*3/uL (ref 0.7–4.0)
MCH: 30.2 pg (ref 26.0–34.0)
MCHC: 32.9 g/dL (ref 30.0–36.0)
MCV: 91.8 fL (ref 80.0–100.0)
Monocytes Absolute: 1.1 10*3/uL — ABNORMAL HIGH (ref 0.1–1.0)
Monocytes Relative: 15 %
Neutro Abs: 2.4 10*3/uL (ref 1.7–7.7)
Neutrophils Relative %: 33 %
Platelets: 296 10*3/uL (ref 150–400)
RBC: 4.51 MIL/uL (ref 3.87–5.11)
RDW: 17.4 % — ABNORMAL HIGH (ref 11.5–15.5)
WBC: 7.2 10*3/uL (ref 4.0–10.5)
nRBC: 0 % (ref 0.0–0.2)

## 2023-09-30 LAB — COMPREHENSIVE METABOLIC PANEL WITH GFR
ALT: 16 U/L (ref 0–44)
AST: 22 U/L (ref 15–41)
Albumin: 4.2 g/dL (ref 3.5–5.0)
Alkaline Phosphatase: 87 U/L (ref 38–126)
Anion gap: 6 (ref 5–15)
BUN: 6 mg/dL — ABNORMAL LOW (ref 8–23)
CO2: 29 mmol/L (ref 22–32)
Calcium: 9.3 mg/dL (ref 8.9–10.3)
Chloride: 102 mmol/L (ref 98–111)
Creatinine, Ser: 0.56 mg/dL (ref 0.44–1.00)
GFR, Estimated: >60 mL/min (ref >60)
Glucose, Bld: 89 mg/dL (ref 70–99)
Potassium: 4.1 mmol/L (ref 3.5–5.1)
Sodium: 137 mmol/L (ref 135–145)
Total Bilirubin: 0.4 mg/dL (ref 0.0–1.2)
Total Protein: 7.3 g/dL (ref 6.5–8.1)

## 2023-09-30 NOTE — Progress Notes (Signed)
 [facility]   [phone] [phone]   Clinic New Consult Note   Patient Care Team: Ngetich, Roxan BROCKS, NP as PCP - General (Family Medicine) Caro Harlene POUR, NP (Geriatric Medicine) Nori Sari SQUIBB, RN as Oncology Nurse Navigator 09/30/2023  CHIEF COMPLAINTS/PURPOSE OF CONSULTATION:  Anal cancer   REFERRING PHYSICIAN: Dr. Cloretta   Discussed the use of AI scribe software for clinical note transcription with the patient, who gave verbal consent to proceed.  History of Present Illness Rebecca Rebecca [name] is a 73 year old female with anal cancer who presents for a new consult and follow-up care. She was referred by Dr. Andriette nurse navigator, Sari, for transferring her oncology care after completing treatment for anal cancer.  Diagnosed with anal cancer following a routine colonoscopy, Lailani initially had no symptoms. The tumor was not fully resectable endoscopically, leading to chemoradiation therapy. During treatment, she experienced significant side effects, including anal burning, skin issues, dehydration requiring hospitalization, and mouth sores, resulting in a 20-pound weight loss.  Post-treatment, she is focused on regaining weight and improving nutrition. Her appetite has improved with mirtazapine  7.5 mg nightly. She consumes small meals and high-calorie foods, uses supplements like Ensure and Boost, and includes probiotics in her diet. She manages her diet to prevent constipation while gaining weight, using Colace as needed.  Her medical history includes osteoporosis and asthma, managed with calcium  and vitamin D  supplements. She has a family history of colon cancer in her mother. Socially, she and her partner live off the grid in [place]  during summers, managing without running water.     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)     SURGICAL HISTORY: Past Surgical History:  Procedure Laterality Date   COLONOSCOPY  07/2017   JMP-MAC-goly(good)-tics/SSP x 1   OPEN REDUCTION INTERNAL FIXATION (ORIF) DISTAL RADIAL FRACTURE Left 01/04/2021   Procedure: OPEN REDUCTION INTERNAL FIXATION (ORIF) DISTAL RADIAL FRACTURE;  Surgeon: Kendal Franky SQUIBB, MD;  Location: [facility] OR;  Service: Orthopedics;  Laterality: Left;   ORIF TIBIA PLATEAU Right 03/16/2020   Procedure: OPEN REDUCTION INTERNAL FIXATION (ORIF) TIBIAL PLATEAU;  Surgeon: Kendal Franky SQUIBB, MD;  Location: [facility] OR;  Service: Orthopedics;  Laterality: Right;   POLYPECTOMY  2019   SSP x 1   TRANSANAL EXCISION OF RECTAL MASS N/A 06/05/2023   Procedure: TRANSANAL EXCISION ANAL LESION;  Surgeon: Debby Hila, MD;  Location: [facility];  Service: General;  Laterality: N/A;   WISDOM TOOTH EXTRACTION      SOCIAL HISTORY: Social History   Socioeconomic History   Marital status: Married    Spouse name: Not on file   Number of children: 1   Years of education: Not on file   Highest education level: Not on file  Occupational History   Not on file  Tobacco Use   Smoking status: Never   Smokeless tobacco: Never  Vaping Use   Vaping status: Never Used  Substance and Sexual Activity   Alcohol use: Yes    Alcohol/week: 7.0 standard drinks of alcohol    Types: 7 Glasses of wine per week   Drug use: Never   Sexual activity: Not Currently  Other Topics Concern   Not on file  Social History  Narrative   ** Merged History Encounter **       Social History  Diet? No beef or pork  Do you drink/eat things with caffeine ? Tea  Marital status?                 married                   What year were you married? 77-94, 98 - current  Do you live in a house, apartment, assisted living, cond   o, trailer, etc.? apartment  Is it one or more stories? 4th  How many persons live in your home? 2  Do you  have any pets in your home? (please list) no  Highest level of education completed? MDIV  Current or past profession: Sports coach   or  Do you exercise?            some                           Type & how often? Walk, yoga 3 x wk  Advanced Directives  Do you have a living will? yes  Do you have a DNR form?          yes                        If not, do you want to discuss on   e?  Do you have signed POA/HPOA for forms? yes  Functional Status  Do you have difficulty bathing or dressing yourself? no  Do you have difficulty preparing food or eating? no  Do you have difficulty managing your medications? no  Do you have    difficulty managing your finances? No   Do you have difficulty affording your medications? No     Social Drivers of Corporate investment banker Strain: Not on file  Food Insecurity: No Food Insecurity (09/30/2023)   Hunger Vital Sign    Worried About Running Out of Food in the Last Year: Never true    Ran Out of Food in the Last Year: Never true  Transportation Needs: No Transportation Needs (09/30/2023)   PRAPARE - Administrator, Civil Service (Medical): No    Lack of Transportation (Non-Medical): No  Physical Activity: Not on file  Stress: Not on file  Social Connections: Socially Integrated (08/31/2023)   Social Connection and Isolation Panel    Frequency of Communication with Friends and Family: Twice a week    Frequency of Social Gatherings with Friends and Family: Once a week    Attends Religious Services: 1 to 4 times per year    Active Member of [employer] or Organizations: No    Attends Engineer, structural: 1 to 4 times per year    Marital Status: Married  Catering manager Violence: Not At Risk (09/30/2023)   Humiliation, Afraid, Rape, and Kick questionnaire    Fear of Current or Ex-Partner: No    Emotionally Abused: No    Physically Abused: No    Sexually Abused: No    FAMILY HISTORY: Family History  Problem  Relation Age of Onset   Colon polyps Mother 52   Cancer - Colon Mother 62   Osteoporosis Mother    Bipolar disorder Mother    Mental illness Mother    Colon cancer Mother 49   Other Father 30       airplane  accident   Diabetes Mellitus II Paternal Grandfather    Anxiety disorder Daughter 56   Esophageal cancer Neg Hx    Stomach cancer Neg Hx    Rectal cancer Neg Hx     ALLERGIES:  is allergic to other.  MEDICATIONS:  Current Outpatient Medications  Medication Sig Dispense Refill   acetaminophen  (TYLENOL ) 500 MG tablet Take 1,000 mg by mouth every 6 (six) hours as needed.     albuterol  (VENTOLIN  HFA) 108 (90 Base) MCG/ACT inhaler Inhale 1 puff into the lungs every 6 (six) hours as needed for wheezing or shortness of breath. 18 g 3   budesonide -formoterol  (SYMBICORT ) 80-4.5 MCG/ACT inhaler TAKE 2 PUFFS BY MOUTH TWICE A Kueker 10.2 each 3   estradiol  (ESTRACE ) 0.1 MG/GM vaginal cream Place 1 Applicatorful vaginally See admin instructions. Apply as directed to the labial area in the morning and at bedtime     fexofenadine  (ALLEGRA ) 180 MG tablet Take 1 tablet (180 mg total) by mouth daily. (Patient taking differently: Take 180 mg by mouth daily as needed.)     mirtazapine  (REMERON ) 7.5 MG tablet Take 1 tablet (7.5 mg total) by mouth at bedtime. 30 tablet 3   rosuvastatin  (CRESTOR ) 5 MG tablet TAKE 1 TABLET BY MOUTH ONCE DAILY 90 tablet 1   No current facility-administered medications for this visit.    REVIEW OF SYSTEMS:   Constitutional: Denies fevers, chills or abnormal night sweats, (+) fatigue  Eyes: Denies blurriness of vision, double vision or watery eyes Ears, nose, mouth, throat, and face: Denies mucositis or sore throat Respiratory: Denies cough, dyspnea or wheezes Cardiovascular: Denies palpitation, chest discomfort or lower extremity swelling Gastrointestinal:  Denies nausea, heartburn or change in bowel habits Skin: Denies abnormal skin rashes Lymphatics: Denies new  lymphadenopathy or easy bruising Neurological:Denies numbness, tingling or new weaknesses Behavioral/Psych: Mood is stable, no new changes  All other systems were reviewed with the patient and are negative.  PHYSICAL EXAMINATION: ECOG PERFORMANCE STATUS: 1 - Symptomatic but completely ambulatory  Vitals:   09/30/23 1420  BP: 129/65  Pulse: (!) 108  Resp: 18  Temp: 97.9 F (36.6 C)  SpO2: 97%   Filed Weights   09/30/23 1420  Weight: 87 lb 11.2 oz (39.8 kg)    GENERAL:alert, no distress and comfortable, thin elderly lady. SKIN: skin color, texture, turgor are normal, no rashes or significant lesions EYES: normal, conjunctiva are pink and non-injected, sclera clear OROPHARYNX:no exudate, no erythema and lips, buccal mucosa, and tongue normal  Musculoskeletal:no cyanosis of digits and no clubbing  PSYCH: alert & oriented x 3 with fluent speech NEURO: no focal motor/sensory deficits  Physical Exam    LABORATORY DATA:  I have reviewed the data as listed    Latest Ref Rng & Units 09/30/2023    3:05 PM 09/04/2023   10:37 AM 09/02/2023    5:22 AM  CBC  WBC 4.0 - 10.5 K/uL 7.2  2.3  1.5   Hemoglobin 12.0 - 15.0 g/dL 86.3  88.4  88.2   Hematocrit 36.0 - 46.0 % 41.4  35.3  34.7   Platelets 150 - 400 K/uL 296  97  145       Latest Ref Rng & Units 09/30/2023    3:05 PM 09/12/2023    4:15 PM 09/04/2023   10:37 AM  CMP  Glucose 70 - 99 mg/dL 89  896  863   BUN 8 - 23 mg/dL 6  9  9    Creatinine 0.44 -  1.00 mg/dL 9.43  9.39  9.42   Sodium 135 - 145 mmol/L 137  134  130   Potassium 3.5 - 5.1 mmol/L 4.1  3.8  3.9   Chloride 98 - 111 mmol/L 102  98  97   CO2 22 - 32 mmol/L 29  28  23    Calcium  8.9 - 10.3 mg/dL 9.3  7.8  7.5   Total Protein 6.5 - 8.1 g/dL 7.3  5.6  4.6   Total Bilirubin 0.0 - 1.2 mg/dL 0.4  0.2  0.9   Alkaline Phos 38 - 126 U/L 87   427   AST 15 - 41 U/L 22  25  37   ALT 0 - 44 U/L 16  28  28       RADIOGRAPHIC STUDIES: I have personally reviewed the  radiological images as listed and agreed with the findings in the report. ECHOCARDIOGRAM COMPLETE Result Date: 09/04/2023    ECHOCARDIOGRAM REPORT   Patient Name:   Rebecca [name] Date of Exam: 09/04/2023 Medical Rec #:  [id number]   Height:       64.0 in Accession #:    [id number]  Weight:       101.0 lb Date of Birth:  [birth year]   BSA:          1.463 m Patient Age:    73 years    BP:           131/70 mmHg Patient Gender: F           HR:           85 bpm. Exam Location:  Inpatient Procedure: 2D Echo, Cardiac Doppler and Color Doppler (Both Spectral and Color            Flow Doppler were utilized during procedure). Indications:    R94.31 Abnormal EKG  History:        Patient has no prior history of Echocardiogram examinations.  Sonographer:    Eva Lash Referring Phys: [id number] KUBER GHIMIRE IMPRESSIONS  1. Left ventricular ejection fraction, by estimation, is 60 to 65%. The left ventricle has normal function. The left ventricle has no regional wall motion abnormalities. Left ventricular diastolic parameters are consistent with Grade I diastolic dysfunction (impaired relaxation).  2. Right ventricular systolic function is normal. The right ventricular size is normal. There is normal pulmonary artery systolic pressure. The estimated right ventricular systolic pressure is 25.7 mmHg.  3. The mitral valve is normal in structure. Trivial mitral valve regurgitation. No evidence of mitral stenosis.  4. The aortic valve is tricuspid. There is mild calcification of the aortic valve. Aortic valve regurgitation is not visualized. Aortic valve sclerosis/calcification is present, without any evidence of aortic stenosis.  5. The inferior vena cava is normal in size with greater than 50% respiratory variability, suggesting right atrial pressure of 3 mmHg. FINDINGS  Left Ventricle: Left ventricular ejection fraction, by estimation, is 60 to 65%. The left ventricle has normal function. The left ventricle has no regional wall motion  abnormalities. The left ventricular internal cavity size was normal in size. There is  no left ventricular hypertrophy. Left ventricular diastolic parameters are consistent with Grade I diastolic dysfunction (impaired relaxation). Right Ventricle: The right ventricular size is normal. No increase in right ventricular wall thickness. Right ventricular systolic function is normal. There is normal pulmonary artery systolic pressure. The tricuspid regurgitant velocity is 2.38 m/s, and  with an assumed right atrial pressure of 3 mmHg, the estimated right ventricular  systolic pressure is 25.7 mmHg. Left Atrium: Left atrial size was normal in size. Right Atrium: Right atrial size was normal in size. Pericardium: There is no evidence of pericardial effusion. Mitral Valve: The mitral valve is normal in structure. Trivial mitral valve regurgitation. No evidence of mitral valve stenosis. Tricuspid Valve: The tricuspid valve is normal in structure. Tricuspid valve regurgitation is mild . No evidence of tricuspid stenosis. Aortic Valve: The aortic valve is tricuspid. There is mild calcification of the aortic valve. Aortic valve regurgitation is not visualized. Aortic valve sclerosis/calcification is present, without any evidence of aortic stenosis. Aortic valve mean gradient measures 2.0 mmHg. Aortic valve peak gradient measures 4.9 mmHg. Aortic valve area, by VTI measures 1.54 cm. Pulmonic Valve: The pulmonic valve was normal in structure. Pulmonic valve regurgitation is trivial. No evidence of pulmonic stenosis. Aorta: The aortic root is normal in size and structure. Venous: The inferior vena cava is normal in size with greater than 50% respiratory variability, suggesting right atrial pressure of 3 mmHg. IAS/Shunts: No atrial level shunt detected by color flow Doppler.  LEFT VENTRICLE PLAX 2D LVIDd:         3.00 cm     Diastology LVIDs:         2.20 cm     LV e' medial:    6.85 cm/s LV PW:         1.10 cm     LV E/e' medial:   12.4 LV IVS:        0.80 cm     LV e' lateral:   10.40 cm/s LVOT diam:     1.50 cm     LV E/e' lateral: 8.2 LV SV:         36 LV SV Index:   25 LVOT Area:     1.77 cm  LV Volumes (MOD) LV vol d, MOD A2C: 41.2 ml LV vol d, MOD A4C: 47.8 ml LV vol s, MOD A2C: 15.7 ml LV vol s, MOD A4C: 14.9 ml LV SV MOD A2C:     25.5 ml LV SV MOD A4C:     47.8 ml LV SV MOD BP:      28.9 ml RIGHT VENTRICLE RV S prime:     11.10 cm/s TAPSE (M-mode): 1.4 cm LEFT ATRIUM             Index LA diam:        2.50 cm 1.71 cm/m LA Vol (A2C):   14.8 ml 10.11 ml/m LA Vol (A4C):   19.4 ml 13.26 ml/m LA Biplane Vol: 17.4 ml 11.89 ml/m  AORTIC VALVE AV Area (Vmax):    1.64 cm AV Area (Vmean):   1.41 cm AV Area (VTI):     1.54 cm AV Vmax:           111.00 cm/s AV Vmean:          70.900 cm/s AV VTI:            0.234 m AV Peak Grad:      4.9 mmHg AV Mean Grad:      2.0 mmHg LVOT Vmax:         103.00 cm/s LVOT Vmean:        56.500 cm/s LVOT VTI:          0.204 m LVOT/AV VTI ratio: 0.87 MITRAL VALVE                TRICUSPID VALVE MV Area (PHT): 3.63 cm  TR Peak grad:   22.7 mmHg MV Decel Time: 209 msec     TR Vmax:        238.00 cm/s MV E velocity: 85.10 cm/s MV A velocity: 107.00 cm/s  SHUNTS MV E/A ratio:  0.80         Systemic VTI:  0.20 m                             Systemic Diam: 1.50 cm Toribio Fuel MD Electronically signed by Toribio Fuel MD Signature Date/Time: 09/04/2023/3:22:31 PM    Final    DG Abd Portable 1V Result Date: 09/01/2023 CLINICAL DATA:  Follow up small bowel obstruction EXAM: PORTABLE ABDOMEN - 1 VIEW COMPARISON:  08/31/2023 FINDINGS: Previously administered contrast now lies entirely within the colon. No persistent small bowel dilatation is seen. No free air is noted. Contrast is seen within the bladder. IMPRESSION: No evidence of small-bowel obstruction. Electronically Signed   By: Oneil Devonshire M.D.   On: 09/01/2023 10:17     Assessment & Plan Anal cancer, stage I  Anal cancer diagnosed during routine  colonoscopy with incomplete initial transanal resection due to positive margins, leading to concurrent definitive chemoradiation therapy. Treatment completed with significant side effects including dehydration, mouth sores, and hospitalization. Currently, no signs of residual disease. Chance of cure estimated at 80-90%. - Order PET scan for end of August to assess treatment response  - Coordinate follow-up visits with surgical oncologist and medical oncologist every three months  Weight loss due to treatment Significant weight loss from treatment-related side effects, including mouth sores and dehydration. Current weight below baseline, but appetite improving with mirtazapine . Nutritional intake managed with dietitian. Goal is weight gain and improved nutritional status. - Continue mirtazapine , consider increasing dose if not drowsy - Encourage high-calorie, high-protein diet with frequent small meals - Use nutritional supplements like Ensure or Boost as tolerated - Monitor weight and nutritional status regularly  Constipation Recent constipation likely related to dietary changes and treatment side effects. Managed with dietary adjustments and probiotics. - Encourage high-fiber diet to aid bowel movements - Use stool softeners like Colace as needed - Avoid laxatives like Miralax  to prevent diarrhea - Continue probiotics to support gut health  Osteoporosis Osteoporosis previously treated with calcium  and vitamin D  supplementation. No current medication for osteoporosis. - Continue calcium  and vitamin D  supplementation - Encourage weight-bearing exercises to maintain bone health  Asthma Asthma well-controlled with as-needed use of inhaler. - Continue as-needed use of inhaler - Monitor for any changes in asthma symptoms  Plan - I have reviewed her course extensively, and confirmed the key findings with patient and her husband - She is recovering well from chemo and radiation, her last  visit and the endoscope exam with Dr. Debby was negative. - She lives in [place]  now, I will see her back in 3 months, along with Dr. Debby, with lab and PET scan 1 week prior to office visit   Orders Placed This Encounter  Procedures   NM PET Image Restag (PS) Skull Base To Thigh    Standing Status:   Future    Expected Date:   12/17/2023    Expiration Date:   09/29/2024    If indicated for the ordered procedure, I authorize the administration of a radiopharmaceutical per Radiology protocol:   Yes    Preferred imaging location?:   [facility]   CBC with Differential/Platelet    Standing Status:  Standing    Number of Occurrences:   50    Expiration Date:   09/29/2024   Comprehensive metabolic panel with GFR    Standing Status:   Standing    Number of Occurrences:   50    Expiration Date:   09/29/2024    All questions were answered. The patient knows to call the clinic with any problems, questions or concerns. I spent 35 minutes counseling the patient face to face. The total time spent in the appointment was 45 minutes including review of chart and various tests results, discussions about plan of care and coordination of care plan.     Onita Mattock, MD 09/30/2023 6:24 PM

## 2023-10-02 ENCOUNTER — Ambulatory Visit: Admitting: Physical Therapy

## 2023-10-03 ENCOUNTER — Inpatient Hospital Stay: Admitting: Nurse Practitioner

## 2023-10-03 ENCOUNTER — Inpatient Hospital Stay: Attending: Oncology

## 2023-10-09 ENCOUNTER — Telehealth: Payer: Self-pay | Admitting: Nurse Practitioner

## 2023-10-09 ENCOUNTER — Encounter

## 2023-10-09 NOTE — Telephone Encounter (Signed)
 Left the patient a voicemail with the scheduled appointment details and to call back to cancel or confirm appointments.

## 2023-10-14 ENCOUNTER — Ambulatory Visit: Admitting: Family

## 2023-10-16 ENCOUNTER — Encounter

## 2023-10-21 ENCOUNTER — Telehealth: Payer: Self-pay

## 2023-10-21 DIAGNOSIS — R634 Abnormal weight loss: Secondary | ICD-10-CM

## 2023-10-21 MED ORDER — MIRTAZAPINE 15 MG PO TABS: 15.0000 mg | ORAL_TABLET | Freq: Every day | ORAL | 0 refills | Status: DC

## 2023-10-21 NOTE — Telephone Encounter (Signed)
 May refill Mirtazapine  15 mg tablet one by mouth daily at bedtime as requested.

## 2023-10-21 NOTE — Telephone Encounter (Signed)
 Called and spoke with patient and confirmed pharmacy

## 2023-10-21 NOTE — Addendum Note (Signed)
 Addended by: LYNNANN COUGH A on: 10/21/2023 01:05 PM   Modules accepted: Orders

## 2023-10-21 NOTE — Telephone Encounter (Signed)
 Copied from CRM [id number]. Topic: Clinical - Medication Question >> Oct 21, 2023  9:08 AM Farrel B wrote: Patient called from [phone], and would like to speak with someone about the medication  mirtazapine  (REMERON ) 7.5 MG tablet, patient states she is currently in [place]  on vacation and needed the prescription sent to the [facility] listed below in the summary. She stated that she was given the medication to increase appetite, and was told by her oncologist that she can double up the dose for the medication; however it would require a new prescription. Please call patient at the number listed above to update on provider's deciscion

## 2023-10-23 ENCOUNTER — Encounter

## 2023-10-30 ENCOUNTER — Encounter

## 2023-11-04 ENCOUNTER — Telehealth: Payer: Self-pay

## 2023-11-04 ENCOUNTER — Other Ambulatory Visit: Payer: Self-pay

## 2023-11-04 ENCOUNTER — Other Ambulatory Visit: Payer: Self-pay | Admitting: Nurse Practitioner

## 2023-11-04 ENCOUNTER — Inpatient Hospital Stay

## 2023-11-04 DIAGNOSIS — R634 Abnormal weight loss: Secondary | ICD-10-CM

## 2023-11-04 MED ORDER — MIRTAZAPINE 15 MG PO TABS: 15.0000 mg | ORAL_TABLET | Freq: Every day | ORAL | 0 refills | Status: DC

## 2023-11-04 NOTE — Telephone Encounter (Signed)
 Pt called requesting a refill on the mirtazapine  15mg .  Pt stated she contacted her PCP who refused to refill the prescription; therefore, pt call Dr. Demetra office to see if the prescription could be filled.  Pt stated she's trying to increase her eating and have been drinking 2 Boost or Ensures a Drzewiecki.  Pt stated she has seen some improvement in her appetite on the Mirtazapine .  Pt stated she would like the refill to be sent to [facility] in [place] where she's currently visiting until next month.  Stated this nurse will make Dr. Lanny and her team aware of her request and they will refill if they feel its appropriate.  Pt verbalized understanding and had no further questions or concerns.

## 2023-11-22 ENCOUNTER — Other Ambulatory Visit: Payer: Self-pay

## 2023-12-11 ENCOUNTER — Encounter: Payer: Self-pay | Admitting: General Practice

## 2023-12-11 NOTE — Progress Notes (Signed)
 [facility] Spiritual Care Note  Met with Rebecca [name] and husband [name] in Spiritual Care office for opportunity to share and process health updates, anticipation of upcoming scans/appointments, and life changes, including questions about where to root geographically and how to build local spiritual community.  Provided compassionate presence, reflective listening, emotional support, and affirmation of strengths. Couple utilizes Spiritual Care well and plans to follow up with health updates for further processing and reflection.  [address] [name], [place], [facility] Pager [phone] Voicemail [phone]

## 2023-12-16 ENCOUNTER — Other Ambulatory Visit: Payer: Self-pay | Admitting: Family

## 2023-12-17 ENCOUNTER — Inpatient Hospital Stay: Attending: Oncology

## 2023-12-17 ENCOUNTER — Encounter: Payer: Self-pay | Admitting: Oncology

## 2023-12-17 ENCOUNTER — Inpatient Hospital Stay: Admitting: Nurse Practitioner

## 2023-12-17 ENCOUNTER — Ambulatory Visit (HOSPITAL_COMMUNITY)
Admission: RE | Admit: 2023-12-17 | Discharge: 2023-12-17 | Disposition: A | Discharge status: Home / Self Care | Source: Ambulatory Visit | Attending: Hematology | Admitting: Hematology

## 2023-12-17 DIAGNOSIS — Z85048 Personal history of other malignant neoplasm of rectum, rectosigmoid junction, and anus: Secondary | ICD-10-CM | POA: Diagnosis not present

## 2023-12-17 DIAGNOSIS — C210 Malignant neoplasm of anus, unspecified: Secondary | ICD-10-CM

## 2023-12-17 DIAGNOSIS — Z923 Personal history of irradiation: Secondary | ICD-10-CM | POA: Diagnosis not present

## 2023-12-17 DIAGNOSIS — Z9221 Personal history of antineoplastic chemotherapy: Secondary | ICD-10-CM | POA: Insufficient documentation

## 2023-12-17 DIAGNOSIS — R636 Underweight: Secondary | ICD-10-CM | POA: Insufficient documentation

## 2023-12-17 DIAGNOSIS — Z79899 Other long term (current) drug therapy: Secondary | ICD-10-CM | POA: Insufficient documentation

## 2023-12-17 DIAGNOSIS — Z08 Encounter for follow-up examination after completed treatment for malignant neoplasm: Secondary | ICD-10-CM | POA: Diagnosis not present

## 2023-12-17 LAB — COMPREHENSIVE METABOLIC PANEL WITH GFR
ALT: 17 U/L (ref 0–44)
AST: 21 U/L (ref 15–41)
Albumin: 4.5 g/dL (ref 3.5–5.0)
Alkaline Phosphatase: 68 U/L (ref 38–126)
Anion gap: 6 (ref 5–15)
BUN: 19 mg/dL (ref 8–23)
CO2: 29 mmol/L (ref 22–32)
Calcium: 9.3 mg/dL (ref 8.9–10.3)
Chloride: 102 mmol/L (ref 98–111)
Creatinine, Ser: 0.75 mg/dL (ref 0.44–1.00)
GFR, Estimated: >60 mL/min (ref >60)
Glucose, Bld: 96 mg/dL (ref 70–99)
Potassium: 4.1 mmol/L (ref 3.5–5.1)
Sodium: 137 mmol/L (ref 135–145)
Total Bilirubin: 0.3 mg/dL (ref 0.0–1.2)
Total Protein: 7.3 g/dL (ref 6.5–8.1)

## 2023-12-17 LAB — CBC WITH DIFFERENTIAL/PLATELET
Abs Immature Granulocytes: 0.01 K/uL (ref 0.00–0.07)
Basophils Absolute: 0.1 K/uL (ref 0.0–0.1)
Basophils Relative: 1 %
Eosinophils Absolute: 0.4 K/uL (ref 0.0–0.5)
Eosinophils Relative: 7 %
HCT: 47.1 % — ABNORMAL HIGH (ref 36.0–46.0)
Hemoglobin: 15.4 g/dL — ABNORMAL HIGH (ref 12.0–15.0)
Immature Granulocytes: 0 %
Lymphocytes Relative: 37 %
Lymphs Abs: 2.3 K/uL (ref 0.7–4.0)
MCH: 30.7 pg (ref 26.0–34.0)
MCHC: 32.7 g/dL (ref 30.0–36.0)
MCV: 93.8 fL (ref 80.0–100.0)
Monocytes Absolute: 0.8 K/uL (ref 0.1–1.0)
Monocytes Relative: 13 %
Neutro Abs: 2.7 K/uL (ref 1.7–7.7)
Neutrophils Relative %: 42 %
Platelets: 256 K/uL (ref 150–400)
RBC: 5.02 MIL/uL (ref 3.87–5.11)
RDW: 11.7 % (ref 11.5–15.5)
WBC: 6.4 K/uL (ref 4.0–10.5)
nRBC: 0 % (ref 0.0–0.2)

## 2023-12-17 LAB — GLUCOSE, CAPILLARY: Glucose-Capillary: 87 mg/dL (ref 70–99)

## 2023-12-17 MED ORDER — FLUDEOXYGLUCOSE F - 18 (FDG) INJECTION
5.0000 | Freq: Once | INTRAVENOUS | Status: AC | PRN
  Administered 2023-12-17: 5.4 via INTRAVENOUS

## 2023-12-18 ENCOUNTER — Telehealth: Payer: Self-pay

## 2023-12-18 NOTE — Telephone Encounter (Signed)
 Copied from CRM [id number]. Topic: Clinical - Prescription Issue >> Dec 18, 2023  9:52 AM Zane F wrote: Reason for CRM:   Patient is calling in to request her COVID 19 vaccine prescription. Pharmacy tends to favor the moderna vaccine for reference.   Preferred Pharmacy:   [id number] - [place], [place] - [address] AT [facility] [address], [place] [address] Phone: [phone]  Fax: [phone]   Callback Number: [phone]

## 2023-12-18 NOTE — Telephone Encounter (Signed)
 Patient called and notified that patients 65+ years and older dont require prescription for Covid vaccine. Patient states that she was told this a while back when she tried to schedule for vaccine with [facility]. Patient states that she will try and get vaccine again. I notified her that there shouldn't be any issues however if issues do occur please let us  know and we will send Covid vaccine in for her.

## 2023-12-23 ENCOUNTER — Telehealth: Payer: Self-pay | Admitting: Nurse Practitioner

## 2023-12-23 NOTE — Telephone Encounter (Signed)
 Scheduled appointments per referral. Talked with the patient and she is aware of the appointment time and date as well as the address. Patient was informed to arrive 10-15 minutes prior with updated insurance information. Patient was also informed that two guests are permitted but needs to be at least 71 years of age. All questions were answered.

## 2023-12-23 NOTE — Assessment & Plan Note (Signed)
 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 -She underwent concurrent chemoradiation with 5-FU and mitomycin  from July 22, 2023 to Aug 29, 2023 -PET 12/17/2023 showed CR, her anal scope exam by Dr. Teresa on 12/17/23 was also negative - Continue cancer surveillance

## 2023-12-24 ENCOUNTER — Inpatient Hospital Stay: Admitting: Nutrition

## 2023-12-24 ENCOUNTER — Inpatient Hospital Stay: Admitting: Hematology

## 2023-12-24 VITALS — BP 136/68 | HR 103 | Temp 98.3°F | Resp 17 | Ht 64.0 in | Wt 102.4 lb

## 2023-12-24 DIAGNOSIS — Z08 Encounter for follow-up examination after completed treatment for malignant neoplasm: Secondary | ICD-10-CM | POA: Diagnosis not present

## 2023-12-24 DIAGNOSIS — C210 Malignant neoplasm of anus, unspecified: Secondary | ICD-10-CM | POA: Diagnosis not present

## 2023-12-24 DIAGNOSIS — Z85048 Personal history of other malignant neoplasm of rectum, rectosigmoid junction, and anus: Secondary | ICD-10-CM | POA: Diagnosis not present

## 2023-12-24 DIAGNOSIS — R636 Underweight: Secondary | ICD-10-CM | POA: Diagnosis not present

## 2023-12-24 DIAGNOSIS — Z9221 Personal history of antineoplastic chemotherapy: Secondary | ICD-10-CM | POA: Diagnosis not present

## 2023-12-24 DIAGNOSIS — Z923 Personal history of irradiation: Secondary | ICD-10-CM | POA: Diagnosis not present

## 2023-12-24 DIAGNOSIS — Z79899 Other long term (current) drug therapy: Secondary | ICD-10-CM | POA: Diagnosis not present

## 2023-12-24 NOTE — Progress Notes (Signed)
 Nutrition follow-up completed with patient and husband.  Patient diagnosed with anal cancer status post completion of chemotherapy and radiation treatments.  Patient is followed by Dr. Lanny.  Weight:  101.4 pounds September 23 87.6 pounds June 23 98 pounds 1.6 ounces May 26.  Labs reviewed.  Noted no evidence of recurrence at present.  Patient has been working hard to increase oral intake in order to gain weight.  Current weight improved approximately 14 pounds since June 23.  Eating small amounts of food throughout the [place].  Consumes 2 cartons of Ensure Plus or equivalent daily.  Currently she does not experience any nutrition impact symptoms.  Nutrition diagnosis: Food and nutrition related knowledge deficit, improved.  Intervention: Encouraged to continue strategies for small frequent meals and snacks to maintain weight/promote weight gain. Continue Ensure Plus or equivalent 2 cartons daily. Provided options for a variety of different oral nutrition supplements and provided coupons and samples. Encouraged patient to continue physical activity as tolerated.  Recommended tai chi/yoga at [facility]. Questions answered.  Monitoring, evaluation, goals: Continue increased calorie and protein intake to promote gain of muscle mass.  Follow-up to be scheduled as needed.  Patient has RD contact information.

## 2023-12-24 NOTE — Progress Notes (Signed)
 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 -She underwent concurrent chemoradiation with 5-FU and mitomycin  from July 22, 2023 to Aug 29, 2023 -PET 12/17/2023 showed CR, her anal scope exam by Dr. Teresa on 12/17/23 was also negative - Continue cancer surveillance  Assessment & Plan Anal cancer in complete response under active surveillance Anal cancer is in complete response following treatment, as confirmed by recent PET scan and physical examination. No evidence of residual disease. Continued monitoring is necessary due to the risk of recurrence. - Schedule follow-up appointments with Dr. Teresa every three months - Schedule follow-up appointments with me every six months - Order CT scan every six months for the first two years, then annually for the next two years - Perform lab tests on the same [place] as the CT scan - Consider PET scan only if there is an abnormal finding on the CT scan  Underweight status Weight is at baseline of 102 lbs, which is her normal weight prior to cancer diagnosis. She is consuming small portions every two hours. - Encourage gradual weight gain if desired  Mirtazapine  taper for appetite and sleep support Currently taking mirtazapine  15 mg for appetite and sleep support. She is considering tapering off the medication.  Mirtazapine  is primarily used for appetite stimulation and sleep, with potential side effects including bowel changes, though these are uncommon. - Taper mirtazapine  by reducing dose to 7.5 mg for two weeks, then take every other Spayd for two weeks before discontinuing - Monitor for any changes in mood or sleep patterns during tapering process  Plan - Personally reviewed her PET scan images and discussed with patient and her husband, which showed complete response. - She will continue cancer surveillance, she will follow-up with Dr. Teresa for endoscope every 3 months. - Follow-up with me in 6 months with lab and CT chest, abdomen and pelvis with contrast 1 week before   SUMMARY OF ONCOLOGIC HISTORY: Oncology History  Anal cancer (HCC)  05/16/2023 Initial Diagnosis   Anal cancer (HCC)   05/16/2023 Cancer Staging   Staging form: Anus, AJCC V9 - Clinical: Stage I (cT1, cN0, cM0) - Signed by Cloretta Arley NOVAK, MD on 05/16/2023 Histologic grade (G): G2 Histologic grading system: 4 grade system   07/22/2023 -  Chemotherapy   Patient is on Treatment Plan : ANUS Mitomycin  D1,28 + 5FU D1-4, 28-31 q32d        Discussed the use of AI scribe software for clinical note transcription with the patient, who gave verbal consent to proceed.  History of Present Illness Rebecca [name] is a 73 year old female with a history of cancer who presents for follow-up.  She feels well and believes she has recovered from her recent treatments. A recent PET scan was performed, and her surgeon told her that her recovery appears complete. Anemia has resolved, and kidney and liver functions are normal.  Previous issues with low calcium  and protein levels have also returned to normal.  She maintains her weight at 102 pounds by consuming two Ensure drinks daily and eating small portions every two hours. She aims to gain more weight and is pleased with her current progress. She takes mirtazapine  15 mg and has not experienced  nausea recently, although she has a gag reflex when chewing dry foods and has adjusted her diet accordingly.     All other systems were reviewed with the patient and are negative.  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)     SURGICAL HISTORY: Past Surgical History:  Procedure Laterality Date   COLONOSCOPY  07/2017   JMP-MAC-goly(good)-tics/SSP x 1   OPEN REDUCTION INTERNAL FIXATION (ORIF) DISTAL RADIAL FRACTURE Left 01/04/2021   Procedure: OPEN REDUCTION INTERNAL FIXATION (ORIF) DISTAL RADIAL FRACTURE;  Surgeon: Kendal Franky SQUIBB, MD;  Location: [facility] OR;  Service: Orthopedics;  Laterality: Left;   ORIF TIBIA PLATEAU Right 03/16/2020   Procedure: OPEN REDUCTION INTERNAL FIXATION (ORIF) TIBIAL PLATEAU;  Surgeon: Kendal Franky SQUIBB, MD;  Location: [facility] OR;  Service: Orthopedics;  Laterality: Right;   POLYPECTOMY  2019   SSP x 1   TRANSANAL EXCISION OF RECTAL MASS N/A 06/05/2023   Procedure: TRANSANAL EXCISION ANAL LESION;  Surgeon: Debby Hila, MD;  Location: [facility];  Service: General;  Laterality: N/A;   WISDOM TOOTH EXTRACTION      I have reviewed the social history and family history with the patient and they are unchanged from previous note.  ALLERGIES:  is allergic to other.  MEDICATIONS:  Current Outpatient Medications  Medication Sig Dispense Refill   acetaminophen  (TYLENOL ) 500 MG tablet Take 1,000 mg by mouth every 6 (six) hours as needed.     albuterol  (VENTOLIN  HFA) 108 (90 Base) MCG/ACT inhaler Inhale 1 puff into the lungs every 6 (six) hours as needed for wheezing or shortness of breath. 18 g 3   budesonide -formoterol  (SYMBICORT ) 80-4.5 MCG/ACT inhaler TAKE 2 PUFFS BY MOUTH TWICE A Kovalenko 10.2 each 3   estradiol  (ESTRACE ) 0.1 MG/GM vaginal cream Place 1 Applicatorful vaginally See admin  instructions. Apply as directed to the labial area in the morning and at bedtime     fexofenadine  (ALLEGRA ) 180 MG tablet Take 1 tablet (180 mg total) by mouth daily. (Patient taking differently: Take 180 mg by mouth daily as needed.)     mirtazapine  (REMERON ) 15 MG tablet Take 1 tablet (15 mg total) by mouth at bedtime. 30 tablet 0   rosuvastatin  (CRESTOR ) 5 MG tablet TAKE 1 TABLET (5 MG TOTAL) BY MOUTH DAILY. 90 tablet 1   No current facility-administered medications for this visit.    PHYSICAL EXAMINATION: ECOG PERFORMANCE STATUS: 0 - Asymptomatic  Vitals:   12/24/23 0800  BP: 136/68  Pulse: (!) 103  Resp: 17  Temp: 98.3 F (36.8 C)  SpO2: 95%   Wt Readings from Last 3 Encounters:  12/24/23 102 lb 6.4 oz (46.4 kg)  09/30/23 87 lb 11.2 oz (39.8 kg)  09/23/23 87 lb 9.6 oz (39.7 kg)     GENERAL:alert, no distress and comfortable SKIN: skin color, texture, turgor are normal, no rashes or significant lesions EYES: normal, Conjunctiva are pink and non-injected, sclera clear  Musculoskeletal:no cyanosis of digits and no clubbing  NEURO: alert & oriented x 3 with fluent speech, no focal motor/sensory deficits  Physical Exam    LABORATORY DATA:  I have reviewed the data as listed    Latest Ref Rng & Units 12/17/2023    8:50 AM 09/30/2023    3:05 PM 09/04/2023   10:37 AM  CBC  WBC 4.0 - 10.5 K/uL 6.4  7.2  2.3   Hemoglobin 12.0 - 15.0 g/dL 84.5  86.3  88.4   Hematocrit 36.0 - 46.0 % 47.1  41.4  35.3   Platelets 150 - 400 K/uL 256  296  97         Latest Ref Rng & Units 12/17/2023    8:50 AM 09/30/2023    3:05 PM 09/12/2023    4:15 PM  CMP  Glucose 70 - 99 mg/dL 96  89  896   BUN 8 - 23 mg/dL 19  6  9    Creatinine 0.44 - 1.00 mg/dL 9.24  9.43  9.39   Sodium 135 - 145 mmol/L 137  137  134   Potassium 3.5 - 5.1 mmol/L 4.1  4.1  3.8   Chloride 98 - 111 mmol/L 102  102  98   CO2 22 - 32 mmol/L 29  29  28    Calcium  8.9 - 10.3 mg/dL 9.3  9.3  7.8   Total Protein 6.5 - 8.1  g/dL 7.3  7.3  5.6   Total Bilirubin 0.0 - 1.2 mg/dL 0.3  0.4  0.2   Alkaline Phos 38 - 126 U/L 68  87    AST 15 - 41 U/L 21  22  25    ALT 0 - 44 U/L 17  16  28        RADIOGRAPHIC STUDIES: I have personally reviewed the radiological images as listed and agreed with the findings in the report. No results found.    Orders Placed This Encounter  Procedures   CT CHEST ABDOMEN PELVIS W CONTRAST    Standing Status:   Future    Expected Date:   06/15/2024    Expiration Date:   12/23/2024    If indicated for the ordered procedure, I authorize the administration of contrast media per Radiology protocol:   Yes    Does the patient have a contrast media/X-ray dye allergy?:   No    Preferred imaging location?:   [facility]    If indicated for the ordered procedure, I authorize the administration of oral contrast media per Radiology protocol:   Yes   All questions were answered. The patient knows to call the clinic with any problems, questions or concerns. No barriers to learning was detected. The total time spent in the appointment was 25 minutes, including review of chart and various tests results, discussions about plan of care and coordination of care plan     Onita Mattock, MD 12/24/2023

## 2023-12-25 ENCOUNTER — Other Ambulatory Visit: Payer: Self-pay

## 2024-01-09 ENCOUNTER — Inpatient Hospital Stay: Attending: Oncology | Admitting: Nurse Practitioner

## 2024-01-09 ENCOUNTER — Encounter: Payer: Self-pay | Admitting: Nurse Practitioner

## 2024-01-09 VITALS — BP 119/65 | HR 93 | Temp 97.6°F | Resp 16 | Ht 64.0 in | Wt 101.5 lb

## 2024-01-09 DIAGNOSIS — C210 Malignant neoplasm of anus, unspecified: Secondary | ICD-10-CM

## 2024-01-09 DIAGNOSIS — Z515 Encounter for palliative care: Secondary | ICD-10-CM

## 2024-01-09 DIAGNOSIS — Z7189 Other specified counseling: Secondary | ICD-10-CM | POA: Diagnosis not present

## 2024-01-09 DIAGNOSIS — R530 Neoplastic (malignant) related fatigue: Secondary | ICD-10-CM | POA: Diagnosis not present

## 2024-01-09 NOTE — Progress Notes (Signed)
 Palliative Medicine [facility]  Telephone:(336) [phone] Fax:(336) [phone]   Name: Rebecca [name] Date: 01/09/2024 MRN: [id number]  DOB: [birth year]  Patient Care Team: Ngetich, Roxan BROCKS, NP as PCP - General (Family Medicine) Caro Harlene POUR, NP (Geriatric Medicine) Nori Sari SQUIBB, RN as Oncology Nurse Navigator    REASON FOR CONSULTATION: Rebecca [name] is a 73 y.o. female with oncologic medical history including stage I anal cancer s/p concurrent chemoradiation. Cancer currently under suveillance.  Palliative is seeing patient for symptom management and goals of care.    SOCIAL HISTORY:     reports that she has never smoked. She has never used smokeless tobacco. She reports current alcohol use of about 7.0 standard drinks of alcohol per week. She reports that she does not use drugs.  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)     PAST SURGICAL HISTORY:  Past Surgical History:  Procedure Laterality Date   COLONOSCOPY  07/2017   JMP-MAC-goly(good)-tics/SSP x 1   OPEN REDUCTION INTERNAL FIXATION (ORIF) DISTAL RADIAL FRACTURE Left 01/04/2021   Procedure: OPEN REDUCTION INTERNAL FIXATION (ORIF) DISTAL RADIAL FRACTURE;  Surgeon: Kendal Franky SQUIBB, MD;  Location: [facility] OR;  Service: Orthopedics;  Laterality: Left;   ORIF TIBIA PLATEAU Right 03/16/2020   Procedure: OPEN REDUCTION INTERNAL FIXATION (ORIF) TIBIAL PLATEAU;  Surgeon: Kendal Franky SQUIBB, MD;  Location: [facility] OR;  Service: Orthopedics;  Laterality: Right;   POLYPECTOMY  2019   SSP x 1   TRANSANAL EXCISION OF RECTAL MASS N/A 06/05/2023   Procedure: TRANSANAL EXCISION ANAL LESION;  Surgeon: Debby Hila, MD;  Location: [facility];  Service: General;  Laterality: N/A;    WISDOM TOOTH EXTRACTION      HEMATOLOGY/ONCOLOGY HISTORY:  Oncology History  Anal cancer (HCC)  05/16/2023 Initial Diagnosis   Anal cancer (HCC)   05/16/2023 Cancer Staging   Staging form: Anus, AJCC V9 - Clinical: Stage I (cT1, cN0, cM0) - Signed by Cloretta Arley NOVAK, MD on 05/16/2023 Histologic grade (G): G2 Histologic grading system: 4 grade system   07/22/2023 -  Chemotherapy   Patient is on Treatment Plan : ANUS Mitomycin  D1,28 + 5FU D1-4, 28-31 q32d       ALLERGIES:  is allergic to other.  MEDICATIONS:  Current Outpatient Medications  Medication Sig Dispense Refill   acetaminophen  (TYLENOL ) 500 MG tablet Take 1,000 mg by mouth every 6 (six) hours as needed.     albuterol  (VENTOLIN  HFA) 108 (90 Base) MCG/ACT inhaler Inhale 1 puff into the lungs every 6 (six) hours as needed for wheezing or shortness of breath. 18 g 3   budesonide -formoterol  (SYMBICORT ) 80-4.5 MCG/ACT inhaler TAKE 2 PUFFS BY MOUTH TWICE A Landenberger 10.2 each 3   estradiol  (ESTRACE ) 0.1 MG/GM vaginal cream Place 1 Applicatorful vaginally See admin instructions. Apply as directed to the labial area in the morning and at bedtime     fexofenadine  (ALLEGRA ) 180 MG tablet Take 1 tablet (180 mg total) by mouth daily. (Patient taking differently: Take 180 mg by mouth daily as needed.)     mirtazapine  (REMERON ) 15 MG tablet Take 1 tablet (15 mg total) by mouth at bedtime. 30 tablet  0   rosuvastatin  (CRESTOR ) 5 MG tablet TAKE 1 TABLET (5 MG TOTAL) BY MOUTH DAILY. 90 tablet 1   No current facility-administered medications for this visit.    VITAL SIGNS: BP 119/65 (BP Location: Left Arm, Patient Position: Sitting)   Pulse 93   Temp 97.6 F (36.4 C) (Temporal)   Resp 16   Ht 5' 4 (1.626 m)   Wt 101 lb 8 oz (46 kg)   SpO2 97%   BMI 17.42 kg/m  Filed Weights   01/09/24 0903  Weight: 101 lb 8 oz (46 kg)    Estimated body mass index is 17.42 kg/m as calculated from the following:   Height as of this encounter: 5' 4  (1.626 m).   Weight as of this encounter: 101 lb 8 oz (46 kg).  LABS: CBC:    Component Value Date/Time   WBC 6.4 12/17/2023 0850   HGB 15.4 (H) 12/17/2023 0850   HGB 12.7 08/30/2023 1533   HCT 47.1 (H) 12/17/2023 0850   PLT 256 12/17/2023 0850   PLT 207 08/30/2023 1533   MCV 93.8 12/17/2023 0850   NEUTROABS 2.7 12/17/2023 0850   LYMPHSABS 2.3 12/17/2023 0850   MONOABS 0.8 12/17/2023 0850   EOSABS 0.4 12/17/2023 0850   BASOSABS 0.1 12/17/2023 0850   Comprehensive Metabolic Panel:    Component Value Date/Time   NA 137 12/17/2023 0850   NA 141 11/25/2012 0000   K 4.1 12/17/2023 0850   CL 102 12/17/2023 0850   CO2 29 12/17/2023 0850   BUN 19 12/17/2023 0850   BUN 14 11/25/2012 0000   CREATININE 0.75 12/17/2023 0850   CREATININE 0.60 09/12/2023 1615   GLUCOSE 96 12/17/2023 0850   CALCIUM  9.3 12/17/2023 0850   AST 21 12/17/2023 0850   AST 17 08/19/2023 1112   ALT 17 12/17/2023 0850   ALT 11 08/19/2023 1112   ALKPHOS 68 12/17/2023 0850   BILITOT 0.3 12/17/2023 0850   BILITOT 0.2 08/19/2023 1112   PROT 7.3 12/17/2023 0850   ALBUMIN 4.5 12/17/2023 0850    RADIOGRAPHIC STUDIES: NM PET Image Restag (PS) Skull Base To Thigh Result Date: 12/20/2023 CLINICAL DATA:  Subsequent treatment strategy for anal cancer. EXAM: NUCLEAR MEDICINE PET SKULL BASE TO THIGH TECHNIQUE: 5.4 mCi F-18 FDG was injected intravenously. Full-ring PET imaging was performed from the skull base to thigh after the radiotracer. CT data was obtained and used for attenuation correction and anatomic localization. Fasting blood glucose:  mg/dl COMPARISON:  Abdomen pelvis CT 08/30/2023.  PET-CT 05/21/2023. FINDINGS: Mediastinal blood pool activity: SUV max 1.6 Liver activity: SUV max NECK: No hypermetabolic lymph nodes in the neck. Incidental CT findings: None. CHEST: Low level FDG accumulation is identified in the unenlarged right axillary node (54/2) otherwise no hypermetabolic lymphadenopathy in the chest. No  suspicious hypermetabolic pulmonary nodule or mass. Incidental CT findings: Small hiatal hernia. ABDOMEN/PELVIS: No abnormal hypermetabolic activity within the liver, pancreas, adrenal glands, or spleen. No hypermetabolic lymph nodes in the abdomen or pelvis. Hypermetabolic activity seen previously in the anal rectal junction has resolved in the interval. Incidental CT findings: Diverticular changes are seen in the left colon without diverticulitis. There is mild atherosclerotic calcification of the abdominal aorta without aneurysm. SKELETON: No focal hypermetabolic activity to suggest skeletal metastasis.3 Incidental CT findings: No worrisome lytic or sclerotic osseous abnormality. IMPRESSION: 1. Interval resolution of hypermetabolic activity seen previously in the anorectal junction. 2. No evidence for hypermetabolic metastatic disease in the neck, chest, abdomen, or pelvis.  3. Low level FDG accumulation in a unenlarged right axillary node is likely reactive. Attention on follow-up recommended. 4. Small hiatal hernia. 5. Left colonic diverticulosis without diverticulitis. 6.  Aortic Atherosclerosis (ICD10-I70.0). Electronically Signed   By: Camellia Candle M.D.   On: 12/20/2023 10:56    PERFORMANCE STATUS (ECOG) : 1 - Symptomatic but completely ambulatory  Review of Systems  Constitutional:  Positive for fatigue.  Unless otherwise noted, a complete review of systems is negative.  Physical Exam General: NAD Cardiovascular: regular rate and rhythm Pulmonary: clear ant fields Abdomen: soft, nontender, + bowel sounds Extremities: no edema, no joint deformities Skin: no rashes Neurological: Alert and oriented x3  Discussed the use of AI scribe software for clinical note transcription with the patient, who gave verbal consent to proceed.  History of Present Illness Rebecca [name] is a 73 year old female who presents to clinic for her initial palliative visit. Patient was also seen by our team during a  previous hospitalization. No acute distress. She is accompanied by her husband. Patient is alert and able to engage appropriately in discussions.   I introduced myself, [place] RN, and Palliative's role in collaboration with the oncology team. Concept of Palliative Care was introduced as specialized medical care for people and their families living with serious illness.  It focuses on providing relief from the symptoms and stress of a serious illness.  The goal is to improve quality of life for both the patient and the family. Values and goals of care important to patient and family were attempted to be elicited.  Rebecca [name] lives in the home with her husband of 30+ years. They have three daughters. Patient worked for many years in [employer]. Enjoys reading.   At home she is able to perform most ADLs independently. No nausea or vomiting. She has soft stools and occasional urgency, using pads as needed. She took Imodium once last week for diarrhea, which was effective. No pain or discomfort. She reports that her energy level is much better and that she is participating in exercise classes at a local senior center.   She experiences hypersensitivity to bowel movements and is focused on regaining weight. She is reducing mirtazapine , previously used for sleep and appetite stimulation, to half a dose every other Kend[place]. She monitors her response, particularly regarding appetite and sleep. She has difficulty with dry foods and maintains nutrition with two Ensure Plus drinks daily. Her weight has increased from 86 pounds to 101.5 pounds.  We discussed Rebecca [name] current illness and what it means in the larger context of her on-going co-morbidities. Natural disease trajectory and expectations were discussed. Patient and her husband are realistic in their understanding.   Rebecca [name] expresses some anxiety over reoccurrence and what the care would look like. Encouraged to continue taking things one Shreiner at a time  with reassurance the team will remain closely connected.   Rebecca [name] reports that her recent PET scan and surgical evaluation were clear, with no abnormalities found according to her providers. She feels relieved and is focusing on post-treatment recovery.  Patient is clear in expressed wishes to continue to treat the treatable allow her every opportunity to continue to thrive.  Remaining hopeful for the best.  Patient has a documented advanced directive on file.  Documents have been reviewed.  Limited code.  She is hopeful to attend future GI and survivorship support group.   I discussed the importance of continued conversation with family and their medical providers  regarding overall plan of care and treatment options, ensuring decisions are within the context of the patients values and GOCs. Assessment & Plan Established therapeutic relationship. Education provided on palliative's role in collaboration with their Oncology/Radiation team.  Status post anal/rectal cancer in remission, cancer surveillance and follow-up care Anal/rectal cancer is in remission following treatment. Recent PET scan and surgical evaluation show no signs of recurrence. The risk of recurrence is low due to early detection and treatment. - Continue cancer surveillance with follow-up appointments every three months per oncology.  Bowel dysfunction after cancer treatment Bowel dysfunction characterized by soft stools and occasional urgency. Occasional use of Imodium is effective. Uses pads for occasional incontinence. - Use Imodium as needed for diarrhea - Monitor bowel movements and adjust diet as necessary  Weight loss, now improving Weight gain from 86 lbs to 101.5 lbs indicates improvement. Current dietary regimen includes two Ensure Plus drinks daily to maintain caloric intake. - Continue consuming two Ensure Plus drinks daily - Monitor weight regularly to ensure continued progress  Appetite disturbance Appetite  disturbance managed with mirtazapine , currently being tapered to half a dose every other Del[place]. Concern about tapering impact on appetite, but no significant issues reported. - Continue tapering mirtazapine  as directed - Monitor appetite and adjust mirtazapine  tapering if necessary -Weight increased to 101  Sleep disturbance Sleep disturbance managed with mirtazapine , currently being tapered. Reports difficulty sleeping on nights without mirtazapine , but no significant issues reported. - Continue tapering mirtazapine  as directed - Monitor sleep quality and adjust mirtazapine  tapering if necessary.  Patient will contact office as needed.   Patient expressed understanding and was in agreement with this plan. She also understands that She can call the clinic at any time with any questions, concerns, or complaints.   Thank you for your referral and allowing Palliative to assist in Rebecca [name]'s care.   Number and complexity of problems addressed: HIGH - 1 or more chronic illnesses with SEVERE exacerbation, progression, or side effects of treatment - advanced cancer, pain. Any controlled substances utilized were prescribed in the context of palliative care.  Visit consisted of counseling and education dealing with the complex and emotionally intense issues of symptom management and palliative care in the setting of serious and potentially life-threatening illness.  Signed by: Levon Borer, AGPCNP-BC Palliative Medicine Team/[facility]

## 2024-01-17 ENCOUNTER — Other Ambulatory Visit: Payer: Self-pay | Admitting: Family

## 2024-01-20 ENCOUNTER — Other Ambulatory Visit: Payer: Medicare Other

## 2024-01-20 DIAGNOSIS — Z681 Body mass index (BMI) 19 or less, adult: Secondary | ICD-10-CM

## 2024-01-20 DIAGNOSIS — E785 Hyperlipidemia, unspecified: Secondary | ICD-10-CM

## 2024-01-20 DIAGNOSIS — J4520 Mild intermittent asthma, uncomplicated: Secondary | ICD-10-CM

## 2024-01-20 DIAGNOSIS — R636 Underweight: Secondary | ICD-10-CM | POA: Diagnosis not present

## 2024-01-20 LAB — LIPID PANEL
Cholesterol: 177 mg/dL (ref <200)
HDL: 74 mg/dL (ref >=50)
LDL Cholesterol (Calc): 83 mg/dL
Non-HDL Cholesterol (Calc): 103 mg/dL (ref <130)
Total CHOL/HDL Ratio: 2.4 (calc) (ref <5.0)
Triglycerides: 100 mg/dL (ref <150)

## 2024-01-20 LAB — CBC WITH DIFFERENTIAL/PLATELET
Absolute Lymphocytes: 2794 {cells}/uL (ref 850–3900)
Absolute Monocytes: 835 {cells}/uL (ref 200–950)
Basophils Absolute: 58 {cells}/uL (ref 0–200)
Basophils Relative: 0.8 %
Eosinophils Absolute: 439 {cells}/uL (ref 15–500)
Eosinophils Relative: 6.1 %
HCT: 48.2 % — ABNORMAL HIGH (ref 35.0–45.0)
Hemoglobin: 15.4 g/dL (ref 11.7–15.5)
MCH: 30 pg (ref 27.0–33.0)
MCHC: 32 g/dL (ref 32.0–36.0)
MCV: 94 fL (ref 80.0–100.0)
MPV: 10.5 fL (ref 7.5–12.5)
Monocytes Relative: 11.6 %
Neutro Abs: 3074 {cells}/uL (ref 1500–7800)
Neutrophils Relative %: 42.7 %
Platelets: 243 Thousand/uL (ref 140–400)
RBC: 5.13 Million/uL — ABNORMAL HIGH (ref 3.80–5.10)
RDW: 11.2 % (ref 11.0–15.0)
Total Lymphocyte: 38.8 %
WBC: 7.2 Thousand/uL (ref 3.8–10.8)

## 2024-01-20 LAB — COMPREHENSIVE METABOLIC PANEL WITH GFR
AG Ratio: 1.8 (calc) (ref 1.0–2.5)
ALT: 17 U/L (ref 6–29)
AST: 18 U/L (ref 10–35)
Albumin: 4.5 g/dL (ref 3.6–5.1)
Alkaline phosphatase (APISO): 58 U/L (ref 37–153)
BUN: 12 mg/dL (ref 7–25)
CO2: 28 mmol/L (ref 20–32)
Calcium: 9.5 mg/dL (ref 8.6–10.4)
Chloride: 102 mmol/L (ref 98–110)
Creat: 0.62 mg/dL (ref 0.60–1.00)
Globulin: 2.5 g/dL (ref 1.9–3.7)
Glucose, Bld: 90 mg/dL (ref 65–99)
Potassium: 4.2 mmol/L (ref 3.5–5.3)
Sodium: 139 mmol/L (ref 135–146)
Total Bilirubin: 0.5 mg/dL (ref 0.2–1.2)
Total Protein: 7 g/dL (ref 6.1–8.1)
eGFR: 95 mL/min/1.73m2 (ref >=60)

## 2024-01-20 LAB — TSH: TSH: 4.84 m[IU]/L — ABNORMAL HIGH (ref 0.40–4.50)

## 2024-01-23 ENCOUNTER — Encounter: Payer: Medicare Other | Admitting: Family

## 2024-01-26 ENCOUNTER — Encounter (HOSPITAL_BASED_OUTPATIENT_CLINIC_OR_DEPARTMENT_OTHER): Payer: Self-pay

## 2024-01-26 ENCOUNTER — Emergency Department (HOSPITAL_BASED_OUTPATIENT_CLINIC_OR_DEPARTMENT_OTHER)

## 2024-01-26 ENCOUNTER — Emergency Department (HOSPITAL_BASED_OUTPATIENT_CLINIC_OR_DEPARTMENT_OTHER)
Admission: EM | Admit: 2024-01-26 | Discharge: 2024-01-26 | Disposition: A | Discharge status: Home / Self Care | Attending: Emergency Medicine | Admitting: Emergency Medicine

## 2024-01-26 ENCOUNTER — Other Ambulatory Visit: Payer: Self-pay

## 2024-01-26 DIAGNOSIS — R0602 Shortness of breath: Secondary | ICD-10-CM | POA: Diagnosis not present

## 2024-01-26 DIAGNOSIS — R0600 Dyspnea, unspecified: Secondary | ICD-10-CM

## 2024-01-26 DIAGNOSIS — J4521 Mild intermittent asthma with (acute) exacerbation: Secondary | ICD-10-CM | POA: Diagnosis not present

## 2024-01-26 DIAGNOSIS — J45901 Unspecified asthma with (acute) exacerbation: Secondary | ICD-10-CM | POA: Diagnosis not present

## 2024-01-26 DIAGNOSIS — R059 Cough, unspecified: Secondary | ICD-10-CM | POA: Diagnosis not present

## 2024-01-26 DIAGNOSIS — I491 Atrial premature depolarization: Secondary | ICD-10-CM | POA: Insufficient documentation

## 2024-01-26 DIAGNOSIS — M810 Age-related osteoporosis without current pathological fracture: Secondary | ICD-10-CM | POA: Diagnosis not present

## 2024-01-26 DIAGNOSIS — J45909 Unspecified asthma, uncomplicated: Secondary | ICD-10-CM | POA: Diagnosis not present

## 2024-01-26 DIAGNOSIS — R000 Tachycardia, unspecified: Secondary | ICD-10-CM | POA: Diagnosis not present

## 2024-01-26 DIAGNOSIS — R918 Other nonspecific abnormal finding of lung field: Secondary | ICD-10-CM | POA: Diagnosis not present

## 2024-01-26 LAB — BASIC METABOLIC PANEL WITH GFR
Anion gap: 10 (ref 5–15)
BUN: 16 mg/dL (ref 8–23)
CO2: 24 mmol/L (ref 22–32)
Calcium: 10 mg/dL (ref 8.9–10.3)
Chloride: 102 mmol/L (ref 98–111)
Creatinine, Ser: 0.69 mg/dL (ref 0.44–1.00)
GFR, Estimated: >60 mL/min (ref >60)
Glucose, Bld: 120 mg/dL — ABNORMAL HIGH (ref 70–99)
Potassium: 4.6 mmol/L (ref 3.5–5.1)
Sodium: 136 mmol/L (ref 135–145)

## 2024-01-26 LAB — RESP PANEL BY RT-PCR (RSV, FLU A&B, COVID)  RVPGX2
Influenza A by PCR: NEGATIVE
Influenza B by PCR: NEGATIVE
Resp Syncytial Virus by PCR: NEGATIVE
SARS Coronavirus 2 by RT PCR: NEGATIVE

## 2024-01-26 LAB — CBC WITH DIFFERENTIAL/PLATELET
Abs Immature Granulocytes: 0.02 K/uL (ref 0.00–0.07)
Basophils Absolute: 0.1 K/uL (ref 0.0–0.1)
Basophils Relative: 1 %
Eosinophils Absolute: 0.7 K/uL — ABNORMAL HIGH (ref 0.0–0.5)
Eosinophils Relative: 7 %
HCT: 46 % (ref 36.0–46.0)
Hemoglobin: 15.5 g/dL — ABNORMAL HIGH (ref 12.0–15.0)
Immature Granulocytes: 0 %
Lymphocytes Relative: 20 %
Lymphs Abs: 2 K/uL (ref 0.7–4.0)
MCH: 29.9 pg (ref 26.0–34.0)
MCHC: 33.7 g/dL (ref 30.0–36.0)
MCV: 88.8 fL (ref 80.0–100.0)
Monocytes Absolute: 1.3 K/uL — ABNORMAL HIGH (ref 0.1–1.0)
Monocytes Relative: 13 %
Neutro Abs: 5.8 K/uL (ref 1.7–7.7)
Neutrophils Relative %: 59 %
Platelets: 256 K/uL (ref 150–400)
RBC: 5.18 MIL/uL — ABNORMAL HIGH (ref 3.87–5.11)
RDW: 12.2 % (ref 11.5–15.5)
WBC: 9.8 K/uL (ref 4.0–10.5)
nRBC: 0 % (ref 0.0–0.2)

## 2024-01-26 MED ORDER — PREDNISONE 20 MG PO TABS: 40.0000 mg | ORAL_TABLET | Freq: Every day | ORAL | 0 refills | Status: AC

## 2024-01-26 MED ORDER — IPRATROPIUM-ALBUTEROL 0.5-2.5 (3) MG/3ML IN SOLN
3.0000 mL | Freq: Once | RESPIRATORY_TRACT | Status: AC
  Administered 2024-01-26: 3 mL via RESPIRATORY_TRACT
  Filled 2024-01-26: qty 3

## 2024-01-26 MED ORDER — BUDESONIDE-FORMOTEROL FUMARATE 80-4.5 MCG/ACT IN AERO: 2.0000 | INHALATION_SPRAY | Freq: Two times a day (BID) | RESPIRATORY_TRACT | 12 refills | Status: AC

## 2024-01-26 MED ORDER — SODIUM CHLORIDE 0.9 % IV BOLUS
1000.0000 mL | Freq: Once | INTRAVENOUS | Status: AC
  Administered 2024-01-26: 1000 mL via INTRAVENOUS

## 2024-01-26 MED ORDER — METHYLPREDNISOLONE SODIUM SUCC 125 MG IJ SOLR
125.0000 mg | Freq: Once | INTRAMUSCULAR | Status: AC
  Administered 2024-01-26: 125 mg via INTRAVENOUS
  Filled 2024-01-26: qty 2

## 2024-01-26 NOTE — ED Provider Notes (Signed)
 [place] EMERGENCY DEPARTMENT AT [facility] Provider Note  CSN: [id number] Arrival date & time: 01/26/24 2024  Chief Complaint(s) Shortness of Breath  HPI Rebecca [name] is a 73 y.o. female who is here today for shortness of breath.  She states she has a history of asthma.  She says that she uses an albuterol  inhaler, supposed to use Symbicort  but ran out a couple of weeks ago.  She states that over the past few days she has noticed that she has a runny nose and a sore throat.  She came in today because she was having difficulty with her breathing.  She denies fever or chills.   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 Authorizing Provider  budesonide -formoterol  (SYMBICORT ) 80-4.5 MCG/ACT inhaler Inhale 2 puffs into the lungs 2 (two) times daily. 01/26/24  Yes Mannie Pac T, DO  predniSONE (DELTASONE) 20 MG tablet Take 2 tablets (40 mg total) by mouth daily for 5 days. 01/26/24 01/31/24 Yes Mannie Pac T, DO  acetaminophen  (TYLENOL ) 500 MG tablet Take 1,000 mg by mouth every 6 (six) hours as needed.    [provider]  estradiol  (ESTRACE ) 0.1 MG/GM vaginal cream Place 1 Applicatorful vaginally See admin instructions. Apply as directed to the labial area  in the morning and at bedtime    [provider]  fexofenadine  (ALLEGRA ) 180 MG tablet Take 1 tablet (180 mg total) by mouth daily. Patient taking differently: Take 180 mg by mouth daily as needed. 07/10/17   Caro Harlene POUR, NP  mirtazapine  (REMERON ) 15 MG tablet Take 1 tablet (15 mg total) by mouth at bedtime. 11/04/23   Boscia, Heather E, NP  rosuvastatin  (CRESTOR ) 5 MG tablet TAKE 1 TABLET (5 MG TOTAL) BY MOUTH DAILY. 12/16/23   Ngetich, Dinah C, NP  VENTOLIN  HFA 108 (90 Base) MCG/ACT inhaler INHALE 1 PUFF BY MOUTH INTO THE LUNGS EVERY 6 HOURS AS NEEDED FOR WHEEZING OR SHORTNESS OF BREATH. 01/17/24   Ngetich, Roxan BROCKS, NP  Past Surgical History Past Surgical History:  Procedure Laterality Date   COLONOSCOPY  07/2017   JMP-MAC-goly(good)-tics/SSP x 1   OPEN REDUCTION INTERNAL FIXATION (ORIF) DISTAL RADIAL FRACTURE Left 01/04/2021   Procedure: OPEN REDUCTION INTERNAL FIXATION (ORIF) DISTAL RADIAL FRACTURE;  Surgeon: Kendal Franky SQUIBB, MD;  Location: [facility] OR;  Service: Orthopedics;  Laterality: Left;   ORIF TIBIA PLATEAU Right 03/16/2020   Procedure: OPEN REDUCTION INTERNAL FIXATION (ORIF) TIBIAL PLATEAU;  Surgeon: Kendal Franky SQUIBB, MD;  Location: [facility] OR;  Service: Orthopedics;  Laterality: Right;   POLYPECTOMY  2019   SSP x 1   TRANSANAL EXCISION OF RECTAL MASS N/A 06/05/2023   Procedure: TRANSANAL EXCISION ANAL LESION;  Surgeon: Debby Hila, MD;  Location: [facility] ORS;  Service: General;  Laterality: N/A;   WISDOM TOOTH EXTRACTION     Family History Family History  Problem Relation Age of Onset   Colon polyps Mother 66   Cancer - Colon Mother 67   Osteoporosis Mother    Bipolar disorder Mother    Mental illness Mother    Colon cancer Mother 76   Other Father 30       airplane accident   Diabetes Mellitus II Paternal Grandfather    Anxiety disorder  Daughter 103   Esophageal cancer Neg Hx    Stomach cancer Neg Hx    Rectal cancer Neg Hx     Social History Social History   Tobacco Use   Smoking status: Never   Smokeless tobacco: Never  Vaping Use   Vaping status: Never Used  Substance Use Topics   Alcohol use: Yes    Alcohol/week: 7.0 standard drinks of alcohol    Types: 7 Glasses of wine per week   Drug use: Never   Allergies Other  Review of Systems Review of Systems  Physical Exam Vital Signs  I have reviewed the triage vital signs BP (!) 138/94 (BP Location: Right Arm)   Pulse (!) 121   Temp 98.1 F (36.7 C)   Resp (!) 24   Ht 5' 4 (1.626 m)   Wt 46 kg   SpO2 95%   BMI 17.41 kg/m   Physical Exam Vitals and nursing note reviewed.  Constitutional:      Appearance: She is not toxic-appearing.  Cardiovascular:     Rate and Rhythm: Regular rhythm. Tachycardia present.  Pulmonary:     Comments: Mild tachypnea, diffuse expiratory wheezing Abdominal:     Palpations: Abdomen is soft.  Musculoskeletal:        General: Normal range of motion.  Neurological:     General: No focal deficit present.     Mental Status: She is alert.     ED Results and Treatments Labs (all labs ordered are listed, but only abnormal results are displayed) Labs Reviewed  CBC WITH DIFFERENTIAL/PLATELET - Abnormal; Notable for the following components:      Result Value   RBC 5.18 (*)    Hemoglobin 15.5 (*)    Monocytes Absolute 1.3 (*)    Eosinophils Absolute 0.7 (*)    All other components within normal limits  BASIC METABOLIC PANEL WITH GFR - Abnormal; Notable for the following components:   Glucose, Bld 120 (*)    All other components within normal limits  RESP PANEL BY RT-PCR (RSV, FLU A&B, COVID)  RVPGX2  Radiology DG Chest Portable 1 View Result Date: 01/26/2024 EXAM: 1 VIEW XRAY OF THE CHEST  01/26/2024 09:28:00 PM COMPARISON: None available. CLINICAL HISTORY: Cough. C/o SOB x 3 days. Pt has Hx of asthma and states her albuterol  inhaler hasn't been helping at home. Pt has audible wheezing. FINDINGS: LUNGS AND PLEURA: The lungs are symmetrically hyperinflated in keeping with changes of underlying COPD. HEART AND MEDIASTINUM: No acute abnormality of the cardiac and mediastinal silhouettes. BONES AND SOFT TISSUES: No acute osseous abnormality. IMPRESSION: 1. Symmetrically hyperinflated lungs, consistent with underlying COPD. Electronically signed by: Dorethia Molt MD 01/26/2024 09:44 PM EDT RP Workstation: [id number]    Pertinent labs & imaging results that were available during my care of the patient were reviewed by me and considered in my medical decision making (see MDM for details).  Medications Ordered in ED Medications  ipratropium-albuterol  (DUONEB) 0.5-2.5 (3) MG/3ML nebulizer solution 3 mL (3 mLs Nebulization Given 01/26/24 2105)  methylPREDNISolone sodium succinate (SOLU-MEDROL) 125 mg/2 mL injection 125 mg (125 mg Intravenous Given 01/26/24 2112)  sodium chloride  0.9 % bolus 1,000 mL (1,000 mLs Intravenous New Bag/Given 01/26/24 2113)  ipratropium-albuterol  (DUONEB) 0.5-2.5 (3) MG/3ML nebulizer solution 3 mL (3 mLs Nebulization Given 01/26/24 2136)                                                                                                                                     Procedures Procedures  (including critical care time)  Medical Decision Making / ED Course   This patient presents to the ED for concern of shortness of breath, this involves an extensive number of treatment options, and is a complaint that carries with it a high risk of complications and morbidity.  The differential diagnosis includes COPD, viral syndrome, pneumonia, consider PE.  MDM: Upon arrival, patient quite tachypneic and tachycardic.  She had taken her albuterol  prior.  Was able to get the  patient's symptoms under control with albuterol  and steroids.  Upon reassessment, her lungs were clear, no significant work of breathing.  O2 sats were 100% on room air.  Patient stated she felt completely improved.  Looking in the patient's chart, she does have a prior history of anal cancer, however has had clear PET scans recently.  Given her lung exam and story, believe this is likely COPD being exacerbated by viral syndrome.  Will not pursue PE workup in this patient.  My independent review the patient's chest x-ray shows no pneumonia.  I will send her a prescription for Symbicort .   Additional history obtained: -Additional history obtained from husband at bedside -External records from outside source obtained and reviewed including: Chart review including previous notes, labs, imaging, consultation notes   Lab Tests: -I ordered, reviewed, and interpreted labs.   The pertinent results include:   Labs Reviewed  CBC WITH DIFFERENTIAL/PLATELET - Abnormal; Notable for the following components:      Result Value  RBC 5.18 (*)    Hemoglobin 15.5 (*)    Monocytes Absolute 1.3 (*)    Eosinophils Absolute 0.7 (*)    All other components within normal limits  BASIC METABOLIC PANEL WITH GFR - Abnormal; Notable for the following components:   Glucose, Bld 120 (*)    All other components within normal limits  RESP PANEL BY RT-PCR (RSV, FLU A&B, COVID)  [id number]      EKG sinus rhythm, tachycardia.  Mild right axis deviation.  No acute ischemia.  EKG Interpretation Date/Time:  Sunday January 26 2024 20:38:05 EDT Ventricular Rate:  121 PR Interval:  132 QRS Duration:  64 QT Interval:  314 QTC Calculation: 445 R Axis:   102  Text Interpretation: Sinus tachycardia with Premature atrial complexes with Abberant conduction Biatrial enlargement Rightward axis Pulmonary disease pattern Abnormal ECG When compared with ECG of 30-Aug-2023 18:34, PREVIOUS ECG IS PRESENT Confirmed by Mannie Pac [phone]) on 01/26/2024 10:15:13 PM         Imaging Studies ordered: I ordered imaging studies including chest x-ray I independently visualized and interpreted imaging. I agree with the radiologist interpretation   Medicines ordered and prescription drug management: Meds ordered this encounter  Medications   ipratropium-albuterol  (DUONEB) 0.5-2.5 (3) MG/3ML nebulizer solution 3 mL   methylPREDNISolone sodium succinate (SOLU-MEDROL) 125 mg/2 mL injection 125 mg    IV methylprednisolone will be converted to either a q12h or q24h frequency with the same total daily dose (TDD).  Ordered Dose: 1 to 125 mg TDD; convert to: TDD q24h.  Ordered Dose: 126 to 250 mg TDD; convert to: TDD div q12h.  Ordered Dose: >250 mg TDD; DAW.   sodium chloride  0.9 % bolus 1,000 mL   ipratropium-albuterol  (DUONEB) 0.5-2.5 (3) MG/3ML nebulizer solution 3 mL   budesonide -formoterol  (SYMBICORT ) 80-4.5 MCG/ACT inhaler    Sig: Inhale 2 puffs into the lungs 2 (two) times daily.    Dispense:  1 each    Refill:  12   predniSONE (DELTASONE) 20 MG tablet    Sig: Take 2 tablets (40 mg total) by mouth daily for 5 days.    Dispense:  10 tablet    Refill:  0    -I have reviewed the patients home medicines and have made adjustments as needed   Cardiac Monitoring: The patient was maintained on a cardiac monitor.  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     Final Clinical Impression(s) / ED Diagnoses Final diagnoses:  Dyspnea, unspecified type  Intermittent asthma with acute exacerbation, unspecified asthma severity     @PCDICTATION @    Mannie Pac T, DO 01/26/24 2227

## 2024-01-26 NOTE — Discharge Instructions (Signed)
 While you were in the emergency room, you received medications to help your breathing.  Your blood work was normal.  Your chest x-ray did not show any pneumonia.  Your viral swabs  for flu, COVID and RSV were negative.  You likely have one of the other viral respiratory infections that are going around.  I recommend using your albuterol  inhaler every 6 hours.  Start using your Symbicort  once in the morning and once in the evening.  I also recommend taking 40 mg of prednisone for the next 5 days.  Please follow-up with your primary care doctor.  Return to the emergency room for any new or worsening symptoms, particularly difficulty with your breathing.  I have included the telephone number for one of our pulmonology offices.  You may call them to make a follow-up appointment.

## 2024-01-26 NOTE — ED Notes (Signed)
 Last albuterol  inhaler use ~2 hours PTA.

## 2024-01-26 NOTE — ED Triage Notes (Signed)
 PT to triage c/o SOB x 3 days. Pt has Hx of asthma and states her albuterol  inhaler hasn't been helping at home. PT has audible wheezing RT notified and to bedside assess. PT continues on room air VSS NAD PT denies fever chills CP. EKG completed.

## 2024-01-29 ENCOUNTER — Ambulatory Visit: Admitting: Internal Medicine

## 2024-01-29 ENCOUNTER — Encounter: Payer: Self-pay | Admitting: Internal Medicine

## 2024-01-29 VITALS — BP 112/69 | HR 84 | Ht 64.0 in | Wt 101.6 lb

## 2024-01-29 DIAGNOSIS — J4520 Mild intermittent asthma, uncomplicated: Secondary | ICD-10-CM

## 2024-01-29 NOTE — Patient Instructions (Addendum)
 Plan A = Automatic = Always=   Symbicort  80 Take 2 puffs first thing in am and then another 2 puffs about 12 hours later.    Work on inhaler technique:  relax and gently blow all the way out then take a nice smooth full deep breath back in, triggering the inhaler at same time you start breathing in.  Hold breath in for at least  5 seconds if you can. Blow out symbicort   thru nose. Rinse and gargle with water when done.  If mouth or throat bother you at all,  try brushing teeth/gums/tongue with arm and hammer toothpaste/ make a slurry and gargle and spit out.     Plan B = Backup (to supplement plan A, not to replace it) Use your albuterol  inhaler as a rescue medication to be used if you can't catch your breath by resting or slowing your pace  or doing a relaxed purse lip breathing pattern.  - The less you use it, the better it will work when you need it. - Ok to use the inhaler up to 2 puffs  every 4 hours if you must but call for appointment if use goes up over your usual need - Don't leave home without it !!  (think of it like the spare tire or starter fluid for your car)     Also  Ok to try albuterol  15 min before an activity (on alternating days)  that you know would usually make you short of breath and see if it makes any difference and if makes none then don't take albuterol  after activity unless you can't catch your breath as this means it's the resting that helps, not the albuterol .  Please schedule a follow up office visit in 4 weeks, sooner if needed  - bring inhalers and PFTs same [place]

## 2024-01-29 NOTE — Assessment & Plan Note (Addendum)
 Onset in her 40s on a back ground of hay fever all her life  - 01/29/2024  After extensive coaching inhaler device,  effectiveness =    75% from a baseline of < 25 % so continue symbicort  80 2bid and approp saba and return for pfts   Re SABA :  I spent extra time with pt today reviewing appropriate use of albuterol  for prn use on exertion with the following points: 1) saba is for relief of sob that does not improve by walking a slower pace or resting but rather if the pt does not improve after trying this first. 2) If the pt is convinced, as many are, that saba helps recover from activity faster then it's easy to tell if this is the case by re-challenging : ie stop, take the inhaler, then p 5 minutes try the exact same activity (intensity of workload) that just caused the symptoms and see if they are substantially diminished or not after saba 3) if there is an activity that reproducibly causes the symptoms, try the saba 15 min before the activity on alternate days   If in fact the saba really does help, then fine to continue to use it prn but advised may need to look closer at the maintenance regimen being used to achieve better control of airways disease with exertion.           Each maintenance medication was reviewed in detail including emphasizing most importantly the difference between maintenance and prns and under what circumstances the prns are to be triggered using an action plan format where appropriate.  Total time for H and P, chart review, counseling, reviewing hfa  device(s) and generating customized AVS unique to this office visit / same [place] charting = 45 min new pt eval

## 2024-01-29 NOTE — Progress Notes (Signed)
 Rebecca [name], female    DOB: [birth year]   MRN: [id number]   Brief patient profile:  6 yowf  never smoker  referred to pulmonary clinic 01/29/2024 by ER for asthma since moved to [place] in her 59s = [place] in setting of hay fever all her life as did her mother on on prn saba and then bad flare up in [place] also in fall with flare x one week > ER 01/26/24      Pt not previously seen by PCCM service.    History of Present Illness  01/29/2024  Pulmonary/ 1st office eval/Laia Wiley symbicort  80 / will be done prednisone 01/31/24  Chief Complaint  Patient presents with   Consult    Referred by [facility] ED- she c/o DOE and dry cough x 1 wk. She has SOB when she walks too fast or when she gets cold.   Dyspnea:  doing housework again but not ready for cross country skiing = [place]  Cough: better / no productive  Sleep: flat bed / one pillow  SABA use: q6 hours (ER doc told me to) 02 ldz:wnwz     No obvious [place] or daytime pattern/variability or assoc excess/ purulent sputum or mucus plugs or hemoptysis or cp or chest tightness, subjective wheeze or overt  hb symptoms.    Also denies any obvious fluctuation of symptoms with weather or environmental changes or other aggravating or alleviating factors except as outlined above   No unusual exposure hx or h/o childhood pna/ asthma or knowledge of premature birth.  Current Allergies, Complete Past Medical History, Past Surgical History, Family History, and Social History were reviewed in [employer] record.  ROS  The following are not active complaints unless bolded Hoarseness, sore throat, dysphagia, dental problems, itching, sneezing,  nasal congestion or discharge of excess mucus or purulent secretions, ear ache,   fever, chills, sweats, unintended wt loss or wt gain, classically pleuritic or exertional cp,  orthopnea pnd or arm/hand swelling  or leg swelling, presyncope, palpitations, abdominal pain, anorexia,  nausea, vomiting, diarrhea  or change in bowel habits or change in bladder habits, change in stools or change in urine, dysuria, hematuria,  rash, arthralgias, visual complaints, headache, numbness, weakness or ataxia or problems with walking or coordination,  change in mood or  memory.             Outpatient Medications Prior to Visit  Medication Sig Dispense Refill   acetaminophen  (TYLENOL ) 500 MG tablet Take 1,000 mg by mouth every 6 (six) hours as needed.     BIOTIN PO Take 1 capsule by mouth daily.     budesonide -formoterol  (SYMBICORT ) 80-4.5 MCG/ACT inhaler Inhale 2 puffs into the lungs 2 (two) times daily. 1 each 12   fexofenadine  (ALLEGRA ) 180 MG tablet Take 1 tablet (180 mg total) by mouth daily.     predniSONE (DELTASONE) 20 MG tablet Take 2 tablets (40 mg total) by mouth daily for 5 days. 10 tablet 0   rosuvastatin  (CRESTOR ) 5 MG tablet TAKE 1 TABLET (5 MG TOTAL) BY MOUTH DAILY. 90 tablet 1   VENTOLIN  HFA 108 (90 Base) MCG/ACT inhaler INHALE 1 PUFF BY MOUTH INTO THE LUNGS EVERY 6 HOURS AS NEEDED FOR WHEEZING OR SHORTNESS OF BREATH. 18 each 3   estradiol  (ESTRACE ) 0.1 MG/GM vaginal cream Place 1 Applicatorful vaginally See admin instructions. Apply as directed to the labial area in the morning and at bedtime     mirtazapine  (REMERON )  15 MG tablet Take 1 tablet (15 mg total) by mouth at bedtime. 30 tablet 0   No facility-administered medications prior to visit.    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)       Objective:     BP 112/69   Pulse 84   Ht 5' 4 (1.626 m) Comment: per pt  Wt 101 lb 9.6 oz (46.1 kg)   SpO2 96% Comment: on RA  BMI 17.44 kg/m   SpO2: 96 % (on RA) amb thin wf nad   HEENT : Oropharynx  clear    Nasal turbinates nl   NECK :  without  apparent JVD/ palpable Nodes/TM    LUNGS:  no acc muscle use,  Nl contour chest which is clear to A and P bilaterally without cough on insp or exp maneuvers   CV:  RRR  no s3 or murmur or increase in P2, and no edema   ABD:  soft and nontender   MS:  Gait nl   ext warm without deformities Or obvious joint restrictions  calf tenderness, cyanosis or clubbing    SKIN: warm and dry without lesions    NEURO:  alert, approp, nl sensorium with  no motor or cerebellar deficits apparent.       I personally reviewed images and agree with radiology impression as follows:  CXR:   portable 01/26/24  Symmetrically hyperinflated lungs,    Assessment   Assessment & Plan Mild intermittent asthma, unspecified whether complicated Onset in her 40s on a back ground of hay fever all her life  - 01/29/2024  After extensive coaching inhaler device,  effectiveness =    75% from a baseline of < 25 % so continue symbicort  80 2bid and approp saba and return for pfts   Re SABA :  I spent extra time with pt today reviewing appropriate use of albuterol  for prn use on exertion with the following points: 1) saba is for relief of sob that does not improve by walking a slower pace or resting but rather if the pt does not improve after trying this first. 2) If the pt is convinced, as many are, that saba helps recover from activity faster then it's easy to tell if this is the case by re-challenging : ie stop, take the inhaler, then p 5 minutes try the exact same activity (intensity of workload) that just caused the symptoms and see if they are substantially diminished or not after saba 3) if there is an activity that reproducibly causes the symptoms, try the saba 15 min before the activity on alternate days   If in fact the saba really does help, then fine to continue to use it prn but advised may need to look closer at the maintenance regimen being used to achieve better control of airways disease with exertion.           Each maintenance medication was  reviewed in detail including emphasizing most importantly the difference between maintenance and prns and under what circumstances the prns are to be triggered using an action plan format where appropriate.  Total time for H and P, chart review, counseling, reviewing hfa  device(s) and generating customized AVS unique to this office visit / same [place] charting =  45 min new pt eval          AVS  Patient Instructions  Plan A = Automatic = Always=   Symbicort  80 Take 2 puffs first thing in am and then another 2 puffs about 12 hours later.    Work on inhaler technique:  relax and gently blow all the way out then take a nice smooth full deep breath back in, triggering the inhaler at same time you start breathing in.  Hold breath in for at least  5 seconds if you can. Blow out symbicort   thru nose. Rinse and gargle with water when done.  If mouth or throat bother you at all,  try brushing teeth/gums/tongue with arm and hammer toothpaste/ make a slurry and gargle and spit out.     Plan B = Backup (to supplement plan A, not to replace it) Use your albuterol  inhaler as a rescue medication to be used if you can't catch your breath by resting or slowing your pace  or doing a relaxed purse lip breathing pattern.  - The less you use it, the better it will work when you need it. - Ok to use the inhaler up to 2 puffs  every 4 hours if you must but call for appointment if use goes up over your usual need - Don't leave home without it !!  (think of it like the spare tire or starter fluid for your car)     Also  Ok to try albuterol  15 min before an activity (on alternating days)  that you know would usually make you short of breath and see if it makes any difference and if makes none then don't take albuterol  after activity unless you can't catch your breath as this means it's the resting that helps, not the albuterol .  Please schedule a follow up office visit in 4 weeks, sooner if needed  - bring inhalers and  PFTs same [place]          Ozell America, MD 01/29/2024

## 2024-01-30 ENCOUNTER — Ambulatory Visit: Payer: Self-pay | Admitting: Family

## 2024-02-03 ENCOUNTER — Encounter: Payer: Self-pay | Admitting: Family

## 2024-02-03 ENCOUNTER — Ambulatory Visit (INDEPENDENT_AMBULATORY_CARE_PROVIDER_SITE_OTHER): Payer: Self-pay | Admitting: Family

## 2024-02-03 VITALS — BP 116/72 | HR 88 | Temp 97.6°F | Resp 18 | Ht 64.0 in | Wt 101.8 lb

## 2024-02-03 DIAGNOSIS — M810 Age-related osteoporosis without current pathological fracture: Secondary | ICD-10-CM

## 2024-02-03 DIAGNOSIS — E7800 Pure hypercholesterolemia, unspecified: Secondary | ICD-10-CM | POA: Diagnosis not present

## 2024-02-03 DIAGNOSIS — J4540 Moderate persistent asthma, uncomplicated: Secondary | ICD-10-CM | POA: Diagnosis not present

## 2024-02-03 DIAGNOSIS — R7989 Other specified abnormal findings of blood chemistry: Secondary | ICD-10-CM

## 2024-02-03 DIAGNOSIS — Z1231 Encounter for screening mammogram for malignant neoplasm of breast: Secondary | ICD-10-CM | POA: Diagnosis not present

## 2024-02-03 DIAGNOSIS — B001 Herpesviral vesicular dermatitis: Secondary | ICD-10-CM

## 2024-02-03 MED ORDER — VALACYCLOVIR HCL 1 G PO TABS
1000.0000 mg | ORAL_TABLET | Freq: Two times a day (BID) | ORAL | 0 refills | Status: AC
Start: 2024-02-03 — End: ?

## 2024-02-03 MED ORDER — MONTELUKAST SODIUM 10 MG PO TABS: 10.0000 mg | ORAL_TABLET | Freq: Every day | ORAL | 3 refills | Status: DC

## 2024-02-03 NOTE — Progress Notes (Signed)
 Provider: Roxan Plough FNP-C   Dorrien Grunder, Roxan BROCKS, NP  Patient Care Team: Tomaz Janis, Roxan BROCKS, NP as PCP - General (Family Medicine) Caro Harlene POUR, NP (Geriatric Medicine) Nori Sari SQUIBB, RN as Oncology Nurse Navigator Pickenpack-Cousar, Fannie SAILOR, NP as Nurse Practitioner (Hospice and Palliative Medicine) Silvano Valrie SQUIBB, RN as Registered Nurse  Extended Emergency Contact Information Primary Emergency Contact: Coolidge,[name] Address: [address]          [place], [place] [address] [place] Home Phone: [phone] Mobile Phone: [phone] Relation: Spouse Secondary Emergency Contact: Malloy,[name] Home Phone: [phone] Relation: Brother  Code Status:  Full Code  Goals of care: Advanced Directive information    02/03/2024    9:00 AM  Advanced Directives  Does Patient Have a Medical Advance Directive? Yes  Type of Estate Agent of [place];Living will  Does patient want to make changes to medical advance directive? No - Patient declined  Copy of Healthcare Power of Attorney in Chart? Yes - validated most recent copy scanned in chart (See row information)     Chief Complaint  Patient presents with   Medical Management of Chronic Issues     Discussed the use of AI scribe software for clinical note transcription with the patient, who gave verbal consent to proceed.  History of Present Illness   Rebecca [name] is a 73 year old female with moderate persistent asthma who presents for a six-month follow-up visit.  She was recently seen in the emergency department on January 26, 2024, for shortness of breath. She had run out of her Symbicort  inhaler a couple of weeks prior to the ED visit. She experienced a runny nose and sore throat in the days leading up to her ED visit, which prompted her to seek care due to difficulty breathing. No fever or chills were present. In the ED, she was treated with prednisone and Solu-Medrol and was  prescribed Symbicort , which she has since picked up and resumed using. She also uses albuterol  as needed.  She reports a recent outbreak of cold sores, which typically start on her lips and spread, especially when she feels unwell. She attributes the spread to her recent illness, which she described as feeling like a cold. She inquired about starting valacyclovir  to manage the cold sores.  Her medication regimen includes Crestor  5 mg daily for hypercholesterolemia, which has shown improvement in her cholesterol levels over the past year. She also takes albuterol  as needed and uses Symbicort  regularly. She has been taking Allegra  daily. She discussed the potential addition of montelukast to help manage her asthma and allergies.  She has experienced some diarrhea recently, described as mucus-like, but it has resolved. She used Imodium to manage the symptoms. No abdominal pain, nausea, or vomiting.  Her social history includes a recent trip to [place]  for a conference, and she reports maintaining a good appetite, having gained back weight she had previously lost. She consumes two Ensure drinks daily to maintain her caloric intake. She reports sleeping at least eight hours per night.   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)    Past Surgical History:  Procedure Laterality Date   COLONOSCOPY  07/2017   JMP-MAC-goly(good)-tics/SSP x 1   OPEN REDUCTION INTERNAL FIXATION (ORIF)  DISTAL RADIAL FRACTURE Left 01/04/2021   Procedure: OPEN REDUCTION INTERNAL FIXATION (ORIF) DISTAL RADIAL FRACTURE;  Surgeon: Kendal Franky SQUIBB, MD;  Location: [facility] OR;  Service: Orthopedics;  Laterality: Left;   ORIF TIBIA PLATEAU Right 03/16/2020   Procedure: OPEN REDUCTION INTERNAL FIXATION (ORIF) TIBIAL PLATEAU;  Surgeon: Kendal Franky SQUIBB, MD;  Location: [facility] OR;   Service: Orthopedics;  Laterality: Right;   POLYPECTOMY  2019   SSP x 1   TRANSANAL EXCISION OF RECTAL MASS N/A 06/05/2023   Procedure: TRANSANAL EXCISION ANAL LESION;  Surgeon: Debby Hila, MD;  Location: [facility];  Service: General;  Laterality: N/A;   WISDOM TOOTH EXTRACTION      Allergies  Allergen Reactions   Other Itching and Other (See Comments)    Pollen and cat & dog dander: Itchy eyes, asthma is triggered, nasal congestion    Allergies as of 02/03/2024       Reactions   Other Itching, Other (See Comments)   Pollen and cat & dog dander: Itchy eyes, asthma is triggered, nasal congestion        Medication List        Accurate as of February 03, 2024  9:43 AM. If you have any questions, ask your nurse or doctor.          STOP taking these medications    acetaminophen  500 MG tablet Commonly known as: TYLENOL  Stopped by: Khyron Garno C Borden Thune       TAKE these medications    BIOTIN PO Take 1 capsule by mouth daily.   budesonide -formoterol  80-4.5 MCG/ACT inhaler Commonly known as: Symbicort  Inhale 2 puffs into the lungs 2 (two) times daily.   fexofenadine  180 MG tablet Commonly known as: ALLEGRA  Take 1 tablet (180 mg total) by mouth daily.   montelukast 10 MG tablet Commonly known as: SINGULAIR Take 1 tablet (10 mg total) by mouth at bedtime. Started by: Marian Grandt C Juda Toepfer   rosuvastatin  5 MG tablet Commonly known as: CRESTOR  TAKE 1 TABLET (5 MG TOTAL) BY MOUTH DAILY.   valACYclovir  1000 MG tablet Commonly known as: VALTREX  Take 1 tablet (1,000 mg total) by mouth 2 (two) times daily. Started by: Suri Tafolla C Carolie Mcilrath   Ventolin  HFA 108 (90 Base) MCG/ACT inhaler Generic drug: albuterol  INHALE 1 PUFF BY MOUTH INTO THE LUNGS EVERY 6 HOURS AS NEEDED FOR WHEEZING OR SHORTNESS OF BREATH.        Review of Systems  Constitutional:  Negative for appetite change, chills, fatigue, fever and unexpected weight change.  HENT:  Negative for congestion, dental problem,  ear discharge, ear pain, facial swelling, hearing loss, nosebleeds, postnasal drip, rhinorrhea, sinus pressure, sinus pain, sneezing, sore throat, tinnitus and trouble swallowing.   Eyes:  Negative for pain, discharge, redness, itching and visual disturbance.  Respiratory:  Negative for cough, chest tightness, shortness of breath and wheezing.   Cardiovascular:  Negative for chest pain, palpitations and leg swelling.  Gastrointestinal:  Negative for abdominal distention, abdominal pain, blood in stool, constipation, diarrhea, nausea and vomiting.  Endocrine: Negative for cold intolerance, heat intolerance, polydipsia, polyphagia and polyuria.  Genitourinary:  Negative for difficulty urinating, dysuria, flank pain, frequency and urgency.  Musculoskeletal:  Negative for arthralgias, back pain, gait problem, joint swelling, myalgias, neck pain and neck stiffness.  Skin:  Negative for color change, pallor, rash and wound.  Neurological:  Negative for dizziness, syncope, speech difficulty, weakness, light-headedness, numbness and headaches.  Hematological:  Does not bruise/bleed easily.  Psychiatric/Behavioral:  Negative for agitation, behavioral problems,  confusion, hallucinations, self-injury, sleep disturbance and suicidal ideas. The patient is not nervous/anxious.     Immunization History  Administered Date(s) Administered   Fluad Quad(high Dose 65+) 12/15/2020, 12/26/2021, 12/21/2022   INFLUENZA, HIGH DOSE SEASONAL PF 02/13/2018, 01/06/2019, 01/01/2024   Influenza-Unspecified 12/31/2016   Moderna Covid-19 Fall Seasonal Vaccine 42yrs & older 12/21/2022   Moderna Sars-Covid-2 Vaccination 12/22/2021   PFIZER(Purple Top)SARS-COV-2 Vaccination 04/29/2019, 05/20/2019, 01/04/2020, 07/20/2020, 12/18/2020   Pneumococcal Conjugate-13 01/17/2017   Pneumococcal Polysaccharide-23 04/09/2019   Tdap 03/14/2020   Unspecified SARS-COV-2 Vaccination 01/01/2024   Pertinent  Health Maintenance Due  Topic  Date Due   Mammogram  02/16/2023   Colonoscopy  05/05/2024   Influenza Vaccine  Completed   DEXA SCAN  Completed      12/26/2021   10:08 AM 12/24/2022    2:12 PM 01/21/2023   10:28 AM 05/15/2023    8:52 AM 02/03/2024    8:59 AM  Fall Risk  Falls in the past year? 0 0 0 0 0  Was there an injury with Fall? 0  0 0 0  Fall Risk Category Calculator 0  0 0 0  Fall Risk Category (Retired) Low       (RETIRED) Patient Fall Risk Level Low fall risk       Patient at Risk for Falls Due to No Fall Risks  No Fall Risks  No Fall Risks  Fall risk Follow up Falls evaluation completed   Falls evaluation completed  Falls evaluation completed     Data saved with a previous flowsheet row definition   Functional Status Survey:    Vitals:   02/03/24 0902  BP: 116/72  Pulse: 88  Resp: 18  Temp: 97.6 F (36.4 C)  SpO2: 99%  Weight: 101 lb 12.8 oz (46.2 kg)  Height: 5' 4 (1.626 m)   Body mass index is 17.47 kg/m. Physical Exam   VITALS: T- 97.6, P- 88, BP- 116/72, SaO2- 99% MEASUREMENTS: Weight- 101. GENERAL: Alert, cooperative, well developed, no acute distress. HEENT: Normocephalic, normal oropharynx, moist mucous membranes, ears without infection, nose normal, no sinus tenderness. NECK: No lymphadenopathy. CHEST: Clear to auscultation bilaterally, no wheezes, rhonchi, or crackles. CARDIOVASCULAR: Normal heart rate and rhythm, S1 and S2 normal without murmurs. ABDOMEN: Soft, non-tender, non-distended, without organomegaly, normal bowel sounds. EXTREMITIES: No cyanosis or edema, no calf tenderness, sensation intact in legs. NEUROLOGICAL: Cranial nerves grossly intact, moves all extremities without gross motor or sensory deficit. SKIN: Mole on left shin, unchanged in size and color.No erythema   PSYCHIATRY/BEHAVIORAL: Mood stable    Labs reviewed: Recent Labs    09/01/23 0748 09/02/23 0522 09/04/23 1037 09/12/23 1615 09/30/23 1505 12/17/23 0850 01/20/24 0802 01/26/24 2104  NA  127* 128* 130* 134*   < > 137 139 136  K 3.8 4.1 3.9 3.8   < > 4.1 4.2 4.6  CL 95* 94* 97* 98   < > 102 102 102  CO2 26 25 23 28    < > 29 28 24   GLUCOSE 104* 105* 136* 103*   < > 96 90 120*  BUN 9 6* 9 9   < > 19 12 16   CREATININE 0.50 0.53 0.57 0.60   < > 0.75 0.62 0.69  CALCIUM  7.2* 7.4* 7.5* 7.8*   < > 9.3 9.5 10.0  MG 2.0 1.8 1.9  --   --   --   --   --   PHOS 2.2* 1.5* 2.4* 3.5  --   --   --   --    < > =  values in this interval not displayed.   Recent Labs    09/04/23 1037 09/12/23 1615 09/30/23 1505 12/17/23 0850 01/20/24 0802  AST 37   < > 22 21 18   ALT 28   < > 16 17 17   ALKPHOS 427*  --  87 68  --   BILITOT 0.9   < > 0.4 0.3 0.5  PROT 4.6*   < > 7.3 7.3 7.0  ALBUMIN 1.7*  --  4.2 4.5  --    < > = values in this interval not displayed.   Recent Labs    12/17/23 0850 01/20/24 0802 01/26/24 2104  WBC 6.4 7.2 9.8  NEUTROABS 2.7 3,074 5.8  HGB 15.4* 15.4 15.5*  HCT 47.1* 48.2* 46.0  MCV 93.8 94.0 88.8  PLT 256 243 256   Lab Results  Component Value Date   TSH 4.84 (H) 01/20/2024   No results found for: HGBA1C Lab Results  Component Value Date   CHOL 177 01/20/2024   HDL 74 01/20/2024   LDLCALC 83 01/20/2024   TRIG 100 01/20/2024   CHOLHDL 2.4 01/20/2024    Significant Diagnostic Results in last 30 days:  DG Chest Portable 1 View Result Date: 01/26/2024 EXAM: 1 VIEW XRAY OF THE CHEST 01/26/2024 09:28:00 PM COMPARISON: None available. CLINICAL HISTORY: Cough. C/o SOB x 3 days. Pt has Hx of asthma and states her albuterol  inhaler hasn't been helping at home. Pt has audible wheezing. FINDINGS: LUNGS AND PLEURA: The lungs are symmetrically hyperinflated in keeping with changes of underlying COPD. HEART AND MEDIASTINUM: No acute abnormality of the cardiac and mediastinal silhouettes. BONES AND SOFT TISSUES: No acute osseous abnormality. IMPRESSION: 1. Symmetrically hyperinflated lungs, consistent with underlying COPD. Electronically signed by: Dorethia Molt MD  01/26/2024 09:44 PM EDT RP Workstation: [id number]    Assessment/Plan  Moderate persistent asthma Recent exacerbation likely due to viral syndrome. Recent ED visit for dyspnea, treated with prednisone and Symbicort . No smoking history. Pulmonologist confirmed no COPD diagnosis. Oxygen saturation at 99%. - Continue Symbicort  as prescribed. - Use albuterol  inhaler as needed. - Prescribed montelukast (Singulair) for allergy prevention. - Educated on contacting on-call line for medication refills during weekends.  Pure hypercholesterolemia Cholesterol levels improved with statin therapy. Total cholesterol decreased from 260 to 177. HDL decreased from 91 to 74. Triglycerides increased from 82 to 100. LDL increased from 68 to 83 but remains below target of 100. - Continue Crestor  5 mg daily. - Advised dietary modifications to reduce triglycerides, including reducing intake of starchy foods.  Age-related osteoporosis without current pathological fracture No current fractures reported.  Herpes labialis (cold sores) Recent outbreak likely exacerbated by recent illness. Cold sores started on lips and spread due to weakened immune defenses. - Prescribed valacyclovir  for cold sore management.  Screening mammogram for malignant neoplasm of breast Due for screening mammogram. Last mammogram was in November 2022. - Ordered screening mammogram at the breast center.  General Health Maintenance Immunizations are up to date, including COVID and flu vaccines. No shingles vaccine due to immunity from chickenpox. No recent falls or issues with anxiety or depression. Adequate sleep reported. - Continue routine health maintenance and screenings.   Family/ staff Communication: Reviewed plan of care with patient verbalized understanding   Labs/tests ordered:  - CBC with Differential/Platelet - CMP with eGFR([facility]) - TSH - Lipid panel - TSH in 8 weeks    Next Appointment : Return in about 6 months  (around 08/02/2024) for medical mangement of chronic issues., Fasting  labs in 6 months prior to visit. TSH in 8 weeks .   Spent 30 minutes of Face to face and non-face to face with patient  >50% time spent counseling; reviewing medical record; tests; labs; documentation and developing future plan of care.   Roxan JAYSON Plough, NP

## 2024-02-06 ENCOUNTER — Encounter: Payer: Self-pay | Admitting: Internal Medicine

## 2024-02-10 ENCOUNTER — Encounter: Payer: Self-pay | Admitting: Oncology

## 2024-02-10 LAB — TEST AUTHORIZATION

## 2024-02-10 LAB — HEPATIC FUNCTION PANEL
AG Ratio: 1.3 (calc) (ref 1.0–2.5)
ALT: 28 U/L (ref 6–29)
AST: 25 U/L (ref 10–35)
Albumin: 3.2 g/dL — ABNORMAL LOW (ref 3.6–5.1)
Alkaline phosphatase (APISO): 120 U/L (ref 37–153)
Bilirubin, Direct: 0.1 mg/dL (ref 0.0–0.2)
Globulin: 2.4 g/dL (ref 1.9–3.7)
Indirect Bilirubin: 0.1 mg/dL — ABNORMAL LOW (ref 0.2–1.2)
Total Bilirubin: 0.2 mg/dL (ref 0.2–1.2)
Total Protein: 5.6 g/dL — ABNORMAL LOW (ref 6.1–8.1)

## 2024-02-10 LAB — BASIC METABOLIC PANEL WITH GFR
BUN: 9 mg/dL (ref 7–25)
CO2: 28 mmol/L (ref 20–32)
Calcium: 7.8 mg/dL — ABNORMAL LOW (ref 8.6–10.4)
Chloride: 98 mmol/L (ref 98–110)
Creat: 0.6 mg/dL (ref 0.60–1.00)
Glucose, Bld: 103 mg/dL (ref 65–139)
Potassium: 3.8 mmol/L (ref 3.5–5.3)
Sodium: 134 mmol/L — ABNORMAL LOW (ref 135–146)
eGFR: 96 mL/min/1.73m2 (ref >=60)

## 2024-02-10 LAB — PHOSPHORUS: Phosphorus: 3.5 mg/dL (ref 2.1–4.3)

## 2024-02-18 ENCOUNTER — Ambulatory Visit

## 2024-02-18 DIAGNOSIS — J4520 Mild intermittent asthma, uncomplicated: Secondary | ICD-10-CM

## 2024-02-18 LAB — PULMONARY FUNCTION TEST
DL/VA % pred: 99 %
DL/VA: 4.1 ml/min/mmHg/L
DLCO cor % pred: 87 %
DLCO cor: 17.22 ml/min/mmHg
DLCO unc % pred: 92 %
DLCO unc: 18.24 ml/min/mmHg
FEF 25-75 Post: 0.67 L/s
FEF 25-75 Pre: 0.45 L/s
FEF2575-%Change-Post: 51 %
FEF2575-%Pred-Post: 35 %
FEF2575-%Pred-Pre: 23 %
FEV1-%Change-Post: 19 %
FEV1-%Pred-Post: 56 %
FEV1-%Pred-Pre: 46 %
FEV1-Post: 1.28 L
FEV1-Pre: 1.07 L
FEV1FVC-%Change-Post: 7 %
FEV1FVC-%Pred-Pre: 63 %
FEV6-%Change-Post: 11 %
FEV6-%Pred-Post: 82 %
FEV6-%Pred-Pre: 74 %
FEV6-Post: 2.39 L
FEV6-Pre: 2.14 L
FEV6FVC-%Change-Post: 0 %
FEV6FVC-%Pred-Post: 101 %
FEV6FVC-%Pred-Pre: 101 %
FVC-%Change-Post: 11 %
FVC-%Pred-Post: 81 %
FVC-%Pred-Pre: 73 %
FVC-Post: 2.47 L
FVC-Pre: 2.21 L
Post FEV1/FVC ratio: 52 %
Post FEV6/FVC ratio: 97 %
Pre FEV1/FVC ratio: 48 %
Pre FEV6/FVC Ratio: 97 %
RV % pred: 163 %
RV: 3.65 L
TLC % pred: 123 %
TLC: 6.33 L

## 2024-02-18 NOTE — Patient Instructions (Signed)
 Full pft performed today

## 2024-02-18 NOTE — Progress Notes (Signed)
 Full pft performed today

## 2024-02-22 ENCOUNTER — Ambulatory Visit: Payer: Self-pay | Admitting: Internal Medicine

## 2024-02-22 ENCOUNTER — Encounter: Payer: Self-pay | Admitting: Internal Medicine

## 2024-03-05 ENCOUNTER — Ambulatory Visit: Admitting: Primary Care

## 2024-03-05 ENCOUNTER — Inpatient Hospital Stay: Admission: RE | Admit: 2024-03-05 | Discharge: 2024-03-05 | Attending: Family | Admitting: Family

## 2024-03-05 ENCOUNTER — Encounter: Payer: Self-pay | Admitting: Primary Care

## 2024-03-05 VITALS — BP 116/60 | HR 87 | Temp 97.4°F | Ht 64.0 in | Wt 108.8 lb

## 2024-03-05 DIAGNOSIS — J4540 Moderate persistent asthma, uncomplicated: Secondary | ICD-10-CM | POA: Diagnosis not present

## 2024-03-05 MED ORDER — MONTELUKAST SODIUM 10 MG PO TABS: 10.0000 mg | ORAL_TABLET | Freq: Every day | ORAL | 3 refills | Status: AC

## 2024-03-05 NOTE — Patient Instructions (Addendum)
 VISIT SUMMARY: You had a follow-up appointment today after your recent emergency room visit for an asthma flare-up. Your asthma is currently well-controlled with no recent symptoms or need for your rescue inhaler. We reviewed your recent breathing test results and discussed your treatment plan moving forward.  YOUR PLAN: -MODERATE PERSISTENT ASTHMA: Asthma is a condition where your airways become inflamed and narrow, making it hard to breathe. Your asthma is well-controlled right now, with no recent symptoms or need for your rescue inhaler. Continue using Symbicort  80 mcg, two puffs every morning and evening, and rinse your mouth after use. Keep taking Singulair  (montelukast ) every night at bedtime and use Allegra  as needed for seasonal allergies. Use your albuterol  inhaler if you have sudden asthma symptoms. If albuterol  doesn't work well, consider using Airsupra as an alternative. Make sure to get your flu, pneumonia, and RSV vaccines. We will repeat your pulmonary function test in six months. Monitor for any increase in asthma flare-ups or if you need prednisone  more than twice a year.  INSTRUCTIONS: Please follow up in six months for a repeat pulmonary function test. Make sure to get your flu, pneumonia, and RSV vaccines. Monitor for any increase in asthma flare-ups or if you need prednisone  more than twice a year.   Follow-up 6 months with Dr. Darlean- in office spirometry    Asthma, Adult  Asthma is a long-term (chronic) condition that causes recurrent episodes in which the lower airways in the lungs become tight and narrow. The narrowing is caused by inflammation and tightening of the smooth muscle around the lower airways. Asthma episodes, also called asthma attacks or asthma flares, may cause coughing, making high-pitched whistling sounds when you breathe, most often when you breathe out (wheezing), shortness of breath, and chest pain. The airways may produce extra mucus caused by the  inflammation and irritation. During an attack, it can be difficult to breathe. Asthma attacks can range from minor to life-threatening. Asthma cannot be cured, but medicines and lifestyle changes can help control it and treat acute attacks. It is important to keep your asthma well controlled so the condition does not interfere with your daily life. What are the causes? This condition is believed to be caused by inherited (genetic) and environmental factors, but its exact cause is not known. What can trigger an asthma attack? Many things can bring on an asthma attack or make symptoms worse. These triggers are different for every person. Common triggers include: Allergens and irritants like mold, dust, pet dander, cockroaches, pollen, air pollution, and chemical odors. Cigarette smoke. Weather changes and cold air. Stress and strong emotional responses such as crying or laughing hard. Certain medications such as aspirin  or beta blockers. Infections and inflammatory conditions, such as the flu, a cold, pneumonia, or inflammation of the nasal membranes (rhinitis). Gastroesophageal reflux disease (GERD). What are the signs or symptoms? Symptoms may occur right after exposure to an asthma trigger or hours later and can vary by person. Common signs and symptoms include: Wheezing. Trouble breathing (shortness of breath). Excessive nighttime or early morning coughing. Chest tightness. Tiredness (fatigue) with minimal activity. Difficulty talking in complete sentences. Poor exercise tolerance. How is this diagnosed? This condition is diagnosed based on: A physical exam and your medical history. Tests, which may include: Lung function studies to evaluate the flow of air in your lungs. Allergy tests. Imaging tests, such as X-rays. How is this treated? There is no cure, but symptoms can be controlled with proper treatment. Treatment usually involves:  Identifying and avoiding your asthma  triggers. Inhaled medicines. Two types are commonly used to treat asthma, depending on severity: Controller medicines. These help prevent asthma symptoms from occurring. They are taken every [place]. Fast-acting reliever or rescue medicines. These quickly relieve asthma symptoms. They are used as needed and provide short-term relief. Using other medicines, such as: Allergy medicines, such as antihistamines, if your asthma attacks are triggered by allergens. Immune medicines (immunomodulators). These are medicines that help control the immune system. Using supplemental oxygen. This is only needed during a severe episode. Creating an asthma action plan. An asthma action plan is a written plan for managing and treating your asthma attacks. This plan includes: A list of your asthma triggers and how to avoid them. Information about when medicines should be taken and when their dosage should be changed. Instructions about using a device called a peak flow meter. A peak flow meter measures how well the lungs are working and the severity of your asthma. It helps you monitor your condition. Follow these instructions at home: Take over-the-counter and prescription medicines only as told by your health care provider. Stay up to date on all vaccinations as recommended by your healthcare provider, including vaccines for the flu and pneumonia. Use a peak flow meter and keep track of your peak flow readings. Understand and use your asthma action plan to address any asthma flares. Do not smoke or allow anyone to smoke in your home. Contact a health care provider if: You have wheezing, shortness of breath, or a cough that is not responding to medicines. Your medicines are causing side effects, such as a rash, itching, swelling, or trouble breathing. You need to use a reliever medicine more than 2-3 times a week. Your peak flow reading is still at 50-79% of your personal best after following your action plan for 1  hour. You have a fever and shortness of breath. Get help right away if: You are getting worse and do not respond to treatment during an asthma attack. You are short of breath when at rest or when doing very little physical activity. You have difficulty eating, drinking, or talking. You have chest pain or tightness. You develop a fast heartbeat or palpitations. You have a bluish color to your lips or fingernails. You are light-headed or dizzy, or you faint. Your peak flow reading is less than 50% of your personal best. You feel too tired to breathe normally. These symptoms may be an emergency. Get help right away. Call 911. Do not wait to see if the symptoms will go away. Do not drive yourself to the hospital. Summary Asthma is a long-term (chronic) condition that causes recurrent episodes in which the airways become tight and narrow. Asthma episodes, also called asthma attacks or asthma flares, can cause coughing, wheezing, shortness of breath, and chest pain. Asthma cannot be cured, but medicines and lifestyle changes can help keep it well controlled and prevent asthma flares. Make sure you understand how to avoid triggers and how and when to use your medicines. Asthma attacks can range from minor to life-threatening. Get help right away if you have an asthma attack and do not respond to treatment with your usual rescue medicines. This information is not intended to replace advice given to you by your health care provider. Make sure you discuss any questions you have with your health care provider. Document Revised: 01/04/2021 Document Reviewed: 12/26/2020 Elsevier Patient Education  2024 [employer].

## 2024-03-05 NOTE — Progress Notes (Signed)
 @Patient  ID: Rebecca [name], female    DOB: [birth year], 73 y.o.   MRN: [id number]  Chief Complaint  Patient presents with   Asthma    Referring provider: Leonarda Roxan BROCKS, NP  HPI: [id number]  never smoker  referred to pulmonary clinic 01/29/2024 by ER for asthma since moved to [place] in her 31s = [place] in setting of hay fever all her life as did her mother on on prn saba and then bad flare up in [facility] also in fall with flare x one week > ER 01/26/24      Pt not previously seen by PCCM service.    History of Present Illness  01/29/2024  Pulmonary/ 1st office eval/Wert symbicort  80 / will be done prednisone  01/31/24  Chief Complaint  Patient presents with   Consult    Referred by [facility] ED- Rebecca [name] c/o DOE and dry cough x 1 wk. Rebecca [name] has SOB when Rebecca [name] walks too fast or when Rebecca [name] gets cold.   Dyspnea:  doing housework again but not ready for cross country skiing = [place]  Cough: better / no productive  Sleep: flat bed / one pillow  SABA use: q6 hours (ER doc told me to) 02 [id number]:[id number]     No obvious [place] to [place] or daytime pattern/variability or assoc excess/ purulent sputum or mucus plugs or hemoptysis or cp or chest tightness, subjective wheeze or overt  hb symptoms.    Also denies any obvious fluctuation of symptoms with weather or environmental changes or other aggravating or alleviating factors except as outlined above   No unusual exposure hx or h/o childhood pna/ asthma or knowledge of premature birth.  Current Allergies, Complete Past Medical History, Past Surgical History, Family History, and Social History were reviewed in [employer] record.  ROS  The following are not active complaints unless bolded Hoarseness, sore throat, dysphagia, dental problems, itching, sneezing,  nasal congestion or discharge of excess mucus or purulent secretions, ear ache,   fever, chills, sweats, unintended wt loss or wt gain, classically pleuritic or exertional cp,  orthopnea  pnd or arm/hand swelling  or leg swelling, presyncope, palpitations, abdominal pain, anorexia, nausea, vomiting, diarrhea  or change in bowel habits or change in bladder habits, change in stools or change in urine, dysuria, hematuria,  rash, arthralgias, visual complaints, headache, numbness, weakness or ataxia or problems with walking or coordination,  change in mood or  memory.            03/05/2024- 1 month FU/ asthma  Discussed the use of AI scribe software for clinical note transcription with the patient, who gave verbal consent to proceed.  History of Present Illness Rebecca [name] is a 72 year old female with asthma who presents for a one month follow-up after an emergency room visit for asthma exacerbation. Rebecca [name] was referred by the emergency room to the pulmonary clinic for follow-up after an asthma exacerbation.  Rebecca [name] has a history of asthma, which was exacerbated recently, leading to an emergency room visit. During that visit, Rebecca [name] experienced shortness of breath and a dry cough. A chest x-ray showed symmetrical hyperinflated lungs. Rebecca [name] was restarted on Symbicort  80 micrograms and treated with prednisone  for the flare-up.  Her asthma symptoms began around the age of 33 when Rebecca [name] moved to [place] . Rebecca [name] experienced shortness of breath with housework at that time. Currently, her asthma is completely controlled, with no use of albuterol  in the last four weeks. Rebecca [name] has  no shortness of breath, wheezing, or coughing that wakes her at night, and her asthma symptoms have not limited her activities at home or work.  Rebecca [name] completed a breathing test on November 18th, which showed lung function at 56%. The test showed obstructive lung disease, which improved after albuterol .  Her family history includes exposure to secondhand smoke from her stepfather, who smoked in the house for about ten years. 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 Rebecca [name] is allergic to dogs and cats and does not have any birds  or feathers in the house.  Rebecca [name] has been taking Singulair  for the past month but ran out. Previously, Rebecca [name] was advised to take Allegra  daily for allergies, but Rebecca [name] has been using it as needed recently due to increased allergy symptoms. Rebecca [name] has a year's supply of Symbicort .  Pulmonary function testing 02/18/24>> FVC 2.47 (81%), FEV1 1.28 (56%), ratio 52 Severe obstructive airway disease with reversibility    Allergies  Allergen Reactions   Other Itching and Other (See Comments)    Pollen and cat & dog dander: Itchy eyes, asthma is triggered, nasal congestion    Immunization History  Administered Date(s) Administered   Fluad Quad(high Dose 65+) 12/15/2020, 12/26/2021, 12/21/2022   INFLUENZA, HIGH DOSE SEASONAL PF 02/13/2018, 01/06/2019, 01/01/2024   Influenza-Unspecified 12/31/2016   Moderna Covid-19 Fall Seasonal Vaccine 4yrs & older 12/21/2022   Moderna Sars-Covid-2 Vaccination 12/22/2021   PFIZER(Purple Top)SARS-COV-2 Vaccination 04/29/2019, 05/20/2019, 01/04/2020, 07/20/2020, 12/18/2020   Pneumococcal Conjugate-13 01/17/2017   Pneumococcal Polysaccharide-23 04/09/2019   Tdap 03/14/2020   Unspecified SARS-COV-2 Vaccination 01/01/2024    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     budesonide -formoterol  (SYMBICORT ) 80-4.5 MCG/ACT inhaler Inhale 2 puffs into the lungs 2 (two) times daily. 1 each 12   fexofenadine  (ALLEGRA ) 180 MG tablet Take 1 tablet (180 mg total) by mouth daily.     montelukast  (SINGULAIR ) 10 MG tablet Take 1 tablet (10 mg total) by mouth at  bedtime. 30 tablet 3   rosuvastatin  (CRESTOR ) 5 MG tablet TAKE 1 TABLET (5 MG TOTAL) BY MOUTH DAILY. 90 tablet 1   valACYclovir  (VALTREX ) 1000 MG tablet Take 1 tablet (1,000 mg total) by mouth 2 (two) times daily. 60 tablet 0   VENTOLIN  HFA 108 (90 Base) MCG/ACT inhaler INHALE 1 PUFF BY MOUTH INTO THE LUNGS EVERY 6 HOURS AS NEEDED FOR WHEEZING OR SHORTNESS OF BREATH. 18 each 3   No facility-administered medications prior to visit.   Review of Systems  Review of Systems  Constitutional: Negative.   Respiratory: Negative.  Negative for cough, shortness of breath and wheezing.    Physical Exam  BP 116/60   Pulse 87   Temp (!) 97.4 F (36.3 C)   Ht 5' 4 (1.626 m) Comment: pt stated  Wt 108 lb 12.8 oz (49.4 kg)   SpO2 97% Comment: ra  BMI 18.68 kg/m  Physical Exam Constitutional:      Appearance: Normal appearance. Rebecca [name] is well-developed.  HENT:     Head: Normocephalic and atraumatic.     Mouth/Throat:     Mouth: Mucous membranes are moist.     Pharynx: Oropharynx is clear.  Eyes:     Pupils: Pupils are equal, round, and reactive to light.  Cardiovascular:     Rate and Rhythm: Normal rate and regular rhythm.     Heart sounds: Normal heart sounds. No murmur heard. Pulmonary:     Effort: Pulmonary effort is normal. No respiratory distress.     Breath sounds: Normal breath sounds. No wheezing or rhonchi.     Comments: CTA Musculoskeletal:        General: Normal range of motion.     Cervical back: Normal range of motion and neck supple.  Skin:    General: Skin is warm and dry.     Findings: No erythema or rash.  Neurological:     General: No focal deficit present.     Mental Status: Rebecca [name] is alert and oriented to person, place, and time. Mental status is at baseline.  Psychiatric:        Mood and Affect: Mood normal.        Behavior: Behavior normal.        Thought Content: Thought content normal.        Judgment: Judgment normal.      Lab Results:  CBC    Component  Value Date/Time   WBC 9.8 01/26/2024 2104   RBC 5.18 (H) 01/26/2024 2104   HGB 15.5 (H) 01/26/2024 2104   HGB 12.7 08/30/2023 1533   HCT 46.0 01/26/2024 2104   PLT 256 01/26/2024 2104   PLT 207 08/30/2023 1533   MCV 88.8 01/26/2024 2104   MCH 29.9 01/26/2024 2104   MCHC 33.7 01/26/2024 2104   RDW 12.2 01/26/2024 2104   LYMPHSABS 2.0 01/26/2024 2104   MONOABS 1.3 (H) 01/26/2024 2104   EOSABS 0.7 (H) 01/26/2024 2104   BASOSABS 0.1 01/26/2024 2104    BMET    Component Value Date/Time   NA 136 01/26/2024 2104   NA 141 11/25/2012 0000   K 4.6 01/26/2024 2104   CL 102 01/26/2024 2104   CO2 24 01/26/2024 2104   GLUCOSE 120 (H) 01/26/2024 2104   BUN 16 01/26/2024 2104   BUN 14 11/25/2012 0000   CREATININE 0.69 01/26/2024 2104   CREATININE 0.62 01/20/2024 0802   CALCIUM  10.0 01/26/2024 2104   GFRNONAA >60 01/26/2024 2104   GFRNONAA >60 08/19/2023 1112   GFRNONAA 79 12/04/2018 0721   GFRAA 92 12/04/2018 0721    BNP No results found for: BNP  ProBNP No results found for: PROBNP  Imaging: No results found.   Assessment & Plan:   No problem-specific Assessment & Plan notes found for this encounter.   1. Moderate persistent asthma, unspecified whether complicated (Primary)   Assessment and Plan Assessment & Plan Moderate persistent asthma Asthma is well-controlled with an ACT score of 25. No recent use of albuterol , wheezing, or nocturnal symptoms. Occasional exertional dyspnea on inclines, likely non-asthma related. Lung function at 56% with obstructive lung disease and reversibility post-albuterol . No smoking history, but secondhand smoke exposure from stepfather. Allergic to dogs and cats. 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 Discussed potential increase in Symbicort  dose if flare-ups occur more than twice a year or if albuterol  is needed frequently. Discussed risks of higher steroid doses, including increased risk of pneumonia,  immunosuppression, and diabetes. Discussed Singulair  (montelukast ) for inflammation and allergy control, with  Allegra  as needed for seasonal allergies. Discussed Valaria as an alternative rescue inhaler with steroid component for infrequent use during flare-ups.  - Continue Symbicort  80 mcg, two puffs every morning and evening, rinse mouth after use. - Continue Singulair  (montelukast ) nightly at bedtime. - Use Allegra  as needed for seasonal allergies. - Use albuterol  as rescue inhaler 2 puffs every 4-6 hours for breakthrough symptoms  - Consider Airsupra as an alternative rescue inhaler if albuterol  is ineffective. - Ensure vaccinations: flu, pneumonia, and RSV vaccines. - Monitor for increased exacerbations or need for prednisone  more than twice a year. - Would re-check in-office spirometry test in six months. - Recommended alpha-1 testing, discussed with patient and seh would like to hold off at this time. Consider at follow-up     Almarie LELON Ferrari, NP 03/05/2024

## 2024-04-20 ENCOUNTER — Telehealth: Payer: Self-pay | Admitting: Licensed Clinical Social Worker

## 2024-04-20 NOTE — Telephone Encounter (Signed)
 Attempted to contact pt regarding potential interest in FYNN (Finding Your New Normal) class. No answer. Left VM with information on class and contact information to register.   Jayant Kriz E Georges Victorio, LCSW

## 2024-05-08 ENCOUNTER — Other Ambulatory Visit: Payer: Self-pay | Admitting: Medical Genetics

## 2024-06-15 ENCOUNTER — Other Ambulatory Visit

## 2024-06-15 ENCOUNTER — Other Ambulatory Visit (HOSPITAL_COMMUNITY)

## 2024-06-22 ENCOUNTER — Ambulatory Visit: Admitting: Hematology
